# Patient Record
Sex: Male | Born: 1935 | Race: White | Hispanic: No | Marital: Married | Smoking: Former smoker
Health system: Southern US, Community
[De-identification: ages and names within clinical notes are randomized; demographics above are authoritative.]

## PROBLEM LIST (undated history)

## (undated) DIAGNOSIS — I119 Hypertensive heart disease without heart failure: Secondary | ICD-10-CM

## (undated) DIAGNOSIS — R2689 Other abnormalities of gait and mobility: Secondary | ICD-10-CM

## (undated) DIAGNOSIS — M25561 Pain in right knee: Secondary | ICD-10-CM

## (undated) DIAGNOSIS — I639 Cerebral infarction, unspecified: Secondary | ICD-10-CM

## (undated) DIAGNOSIS — C61 Malignant neoplasm of prostate: Secondary | ICD-10-CM

## (undated) DIAGNOSIS — E785 Hyperlipidemia, unspecified: Secondary | ICD-10-CM

## (undated) DIAGNOSIS — K859 Acute pancreatitis without necrosis or infection, unspecified: Secondary | ICD-10-CM

## (undated) DIAGNOSIS — M25562 Pain in left knee: Secondary | ICD-10-CM

## (undated) DIAGNOSIS — I482 Chronic atrial fibrillation, unspecified: Secondary | ICD-10-CM

## (undated) DIAGNOSIS — M1990 Unspecified osteoarthritis, unspecified site: Secondary | ICD-10-CM

## (undated) DIAGNOSIS — C801 Malignant (primary) neoplasm, unspecified: Secondary | ICD-10-CM

## (undated) DIAGNOSIS — E119 Type 2 diabetes mellitus without complications: Secondary | ICD-10-CM

## (undated) DIAGNOSIS — F0390 Unspecified dementia without behavioral disturbance: Secondary | ICD-10-CM

## (undated) HISTORY — DX: Unspecified osteoarthritis, unspecified site: M19.90

## (undated) HISTORY — PX: APPENDECTOMY: SHX54

## (undated) HISTORY — PX: PANCREAS SURGERY: SHX731

## (undated) HISTORY — PX: CHOLECYSTECTOMY: SHX55

## (undated) HISTORY — PX: EYE SURGERY: SHX253

---

## 2010-10-08 DIAGNOSIS — C801 Malignant (primary) neoplasm, unspecified: Secondary | ICD-10-CM

## 2010-10-08 HISTORY — DX: Malignant (primary) neoplasm, unspecified: C80.1

## 2014-10-27 ENCOUNTER — Emergency Department (HOSPITAL_COMMUNITY): Payer: Medicare HMO

## 2014-10-27 ENCOUNTER — Encounter (HOSPITAL_COMMUNITY): Payer: Self-pay | Admitting: Emergency Medicine

## 2014-10-27 ENCOUNTER — Emergency Department (HOSPITAL_COMMUNITY)
Admission: EM | Admit: 2014-10-27 | Discharge: 2014-10-27 | Disposition: A | Discharge status: Home / Self Care | Payer: Medicare HMO | Attending: Emergency Medicine | Admitting: Emergency Medicine

## 2014-10-27 DIAGNOSIS — F0390 Unspecified dementia without behavioral disturbance: Secondary | ICD-10-CM | POA: Insufficient documentation

## 2014-10-27 DIAGNOSIS — S0990XA Unspecified injury of head, initial encounter: Secondary | ICD-10-CM | POA: Diagnosis present

## 2014-10-27 DIAGNOSIS — Y998 Other external cause status: Secondary | ICD-10-CM | POA: Insufficient documentation

## 2014-10-27 DIAGNOSIS — W1839XA Other fall on same level, initial encounter: Secondary | ICD-10-CM | POA: Insufficient documentation

## 2014-10-27 DIAGNOSIS — Z7982 Long term (current) use of aspirin: Secondary | ICD-10-CM | POA: Insufficient documentation

## 2014-10-27 DIAGNOSIS — I10 Essential (primary) hypertension: Secondary | ICD-10-CM | POA: Insufficient documentation

## 2014-10-27 DIAGNOSIS — E785 Hyperlipidemia, unspecified: Secondary | ICD-10-CM | POA: Diagnosis not present

## 2014-10-27 DIAGNOSIS — Y9301 Activity, walking, marching and hiking: Secondary | ICD-10-CM | POA: Diagnosis not present

## 2014-10-27 DIAGNOSIS — Z8546 Personal history of malignant neoplasm of prostate: Secondary | ICD-10-CM | POA: Insufficient documentation

## 2014-10-27 DIAGNOSIS — Z79899 Other long term (current) drug therapy: Secondary | ICD-10-CM | POA: Insufficient documentation

## 2014-10-27 DIAGNOSIS — E119 Type 2 diabetes mellitus without complications: Secondary | ICD-10-CM | POA: Insufficient documentation

## 2014-10-27 DIAGNOSIS — R52 Pain, unspecified: Secondary | ICD-10-CM

## 2014-10-27 DIAGNOSIS — Z87891 Personal history of nicotine dependence: Secondary | ICD-10-CM | POA: Insufficient documentation

## 2014-10-27 DIAGNOSIS — S0083XA Contusion of other part of head, initial encounter: Secondary | ICD-10-CM | POA: Insufficient documentation

## 2014-10-27 DIAGNOSIS — W19XXXA Unspecified fall, initial encounter: Secondary | ICD-10-CM

## 2014-10-27 DIAGNOSIS — Y9289 Other specified places as the place of occurrence of the external cause: Secondary | ICD-10-CM | POA: Diagnosis not present

## 2014-10-27 DIAGNOSIS — S0081XA Abrasion of other part of head, initial encounter: Secondary | ICD-10-CM | POA: Insufficient documentation

## 2014-10-27 DIAGNOSIS — T1490XA Injury, unspecified, initial encounter: Secondary | ICD-10-CM

## 2014-10-27 DIAGNOSIS — S6992XA Unspecified injury of left wrist, hand and finger(s), initial encounter: Secondary | ICD-10-CM | POA: Diagnosis not present

## 2014-10-27 DIAGNOSIS — Z8719 Personal history of other diseases of the digestive system: Secondary | ICD-10-CM | POA: Insufficient documentation

## 2014-10-27 HISTORY — DX: Acute pancreatitis without necrosis or infection, unspecified: K85.90

## 2014-10-27 HISTORY — DX: Cerebral infarction, unspecified: I63.9

## 2014-10-27 HISTORY — DX: Malignant (primary) neoplasm, unspecified: C80.1

## 2014-10-27 HISTORY — DX: Type 2 diabetes mellitus without complications: E11.9

## 2014-10-27 HISTORY — DX: Hyperlipidemia, unspecified: E78.5

## 2014-10-27 HISTORY — DX: Malignant neoplasm of prostate: C61

## 2014-10-27 HISTORY — DX: Other abnormalities of gait and mobility: R26.89

## 2014-10-27 HISTORY — DX: Unspecified dementia, unspecified severity, without behavioral disturbance, psychotic disturbance, mood disturbance, and anxiety: F03.90

## 2014-10-27 MED ORDER — OXYCODONE-ACETAMINOPHEN 5-325 MG PO TABS: 1.0000 | ORAL_TABLET | ORAL | Status: DC | PRN

## 2014-10-27 MED ORDER — OXYCODONE-ACETAMINOPHEN 5-325 MG PO TABS
1.0000 | ORAL_TABLET | Freq: Once | ORAL | Status: AC
  Administered 2014-10-27: 1 via ORAL
  Filled 2014-10-27: qty 1

## 2014-10-27 NOTE — ED Provider Notes (Signed)
CSN: [id number]     Arrival date & time 10/27/14  7829 History   First MD Initiated Contact with Patient 10/27/14 [phone]     Chief Complaint  Patient presents with  . Fall  . Facial Injury      HPI Patient presents to the emergency department after a fall today's.  He states he lost his balance when he was walking down his driveway this morning.  He injured his face and his chin.  He reports head injury.  He denies significant neck pain.  He reports pain in his left chest and pain in his right wrist.  Patient is on anticoagulation and was concerned about a large hematoma of his chin.  Family reports no altered mental status.  Patient denies nausea or vomiting.  No loss consciousness.  Mild left-sided headache at this time.  Mild right jaw pain.  No significant shortness of breath.  Denies abdominal pain.  Denies neck pain.  No weakness of his arms or legs.  Small laceration to his buccal mucosa surface of his lower lip.   Past Medical History  Diagnosis Date  . Pancreatitis   . A-fib   . Cancer   . Prostate cancer   . Dementia   . Stroke   . Hypertension   . Diabetes mellitus without complication   . Hyperlipidemia   . Poor balance    Past Surgical History  Procedure Laterality Date  . Eye surgery    . Appendectomy    . Cholecystectomy    . Pancreas surgery     History reviewed. No pertinent family history. History  Substance Use Topics  . Smoking status: Former Research scientist (life sciences)  . Smokeless tobacco: Never Used  . Alcohol Use: Yes     Comment: "a beer once in a while"    Review of Systems  All other systems reviewed and are negative.     Allergies  Review of patient's allergies indicates no known allergies.  Home Medications   Prior to Admission medications   Medication Sig Start Date End Date Taking? Authorizing Provider  aspirin 325 MG tablet Take 325 mg by mouth daily.   Yes Historical Provider, MD  bicalutamide (CASODEX) 50 MG tablet Take 50 mg by mouth at bedtime.    Yes Historical Provider, MD  bimatoprost (LUMIGAN) 0.01 % SOLN Place 1 drop into both eyes at bedtime.   Yes Historical Provider, MD  brimonidine (ALPHAGAN P) 0.1 % SOLN Place 1 drop into both eyes every morning.   Yes Historical Provider, MD  dabigatran (PRADAXA) 150 MG CAPS capsule Take 150 mg by mouth 2 (two) times daily.   Yes Historical Provider, MD  lovastatin (MEVACOR) 40 MG tablet Take 40 mg by mouth at bedtime.   Yes Historical Provider, MD  metFORMIN (GLUCOPHAGE) 500 MG tablet Take 500 mg by mouth daily.   Yes Historical Provider, MD  valsartan-hydrochlorothiazide (DIOVAN-HCT) 80-12.5 MG per tablet Take 1 tablet by mouth daily.   Yes Historical Provider, MD   BP 151/67 mmHg  Pulse 59  Temp(Src) 97.8 F (36.6 C) (Oral)  Resp 17  Ht 6' (1.829 m)  Wt 190 lb (86.183 kg)  BMI 25.76 kg/m2  SpO2 96% Physical Exam  Constitutional: He is oriented to person, place, and time. He appears well-developed and well-nourished.  HENT:  Multiple abrasions of the left side of his face.  Mild swelling of his lower lip.  Small laceration to the buccal mucosa without active bleeding.  Large hematoma to his  midline chin.  No trismus or malocclusion.  No new dental injury or subluxation.   Eyes: EOM are normal.  Neck: Normal range of motion. Neck supple.  C-spine nontender.  C-spine cleared by Nexus criteria.  Cardiovascular: Normal rate, regular rhythm, normal heart sounds and intact distal pulses.   Pulmonary/Chest: Effort normal and breath sounds normal. No respiratory distress.  Abdominal: Soft. He exhibits no distension. There is no tenderness.  Musculoskeletal:  Mild tenderness of the left wrist in mild pain with range of motion.  No point tenderness at his anatomic snuffbox on the left.  Full range of motion of bilateral hips, knees, ankles.  Full range of motion of bilateral elbows and shoulders.  No thoracic tenderness or lumbar tenderness.  Neurological: He is alert and oriented to person,  place, and time.  Skin: Skin is warm and dry.  Psychiatric: He has a normal mood and affect. Judgment normal.  Nursing note and vitals reviewed.   ED Course  Procedures (including critical care time) Labs Review Labs Reviewed - No data to display  Imaging Review Dg Chest 2 View  10/27/2014   CLINICAL DATA:  81 year old male who fell when dizzy this morning. Trauma with mid anterior chest pain. Initial encounter.  EXAM: CHEST  2 VIEW  COMPARISON:  Head and face CT from the same day reported separately.  FINDINGS: Seated upright AP and lateral views of the chest. Mild cardiomegaly. Other mediastinal contours are within normal limits. Visualized tracheal air column is within normal limits. Somewhat low lung volumes. No pneumothorax or pleural effusion. No consolidation, pulmonary edema, or confluent pulmonary opacity. Negative visible bowel gas pattern. Calcified atherosclerosis of the aorta. No acute osseous abnormality identified.  IMPRESSION: Low lung volumes. No acute cardiopulmonary abnormality or acute traumatic injury identified.   Electronically Signed   By: Lars Pinks M.D.   On: 10/27/2014 10:56   Dg Wrist Complete Left  10/27/2014   CLINICAL DATA:  81 year old male status post fall onto left wrist with pain, specially at the base of the left thumb. Initial encounter.  EXAM: LEFT WRIST - COMPLETE 3+ VIEW  COMPARISON:  None.  FINDINGS: Calcified atherosclerosis about the left wrist. Chondrocalcinosis along the medial carpal bones, which can be seen in the setting of calcium pyrophosphate deposition disease. Distal left radius and ulna appear intact. Mild widening of the scapholunate interval. No scaphoid fracture. No carpal bone fracture identified. Left thumb metacarpal appears intact. Other visible metacarpals appear intact.  IMPRESSION: 1. No acute fracture or dislocation identified about the left wrist. 2. Mild widening of the scapholunate interval suggesting age indeterminate ligamentous  injury. 3. Chondrocalcinosis.  Calcified atherosclerosis.   Electronically Signed   By: Lars Pinks M.D.   On: 10/27/2014 08:16   Ct Head Wo Contrast  10/27/2014   CLINICAL DATA:  81 year old male who fell on the driveway this morning. Abrasions, pain, unknown loss of consciousness. Initial encounter.  EXAM: CT HEAD WITHOUT CONTRAST  CT MAXILLOFACIAL WITHOUT CONTRAST  TECHNIQUE: Multidetector CT imaging of the head and maxillofacial structures were performed using the standard protocol without intravenous contrast. Multiplanar CT image reconstructions of the maxillofacial structures were also generated.  COMPARISON:  None.  FINDINGS: CT HEAD FINDINGS  Visualized orbit soft tissues are within normal limits. No scalp hematoma identified. Visualized paranasal sinuses and mastoids are clear. Calvarium intact.  No midline shift, mass effect, or evidence of intracranial mass lesion. No acute intracranial hemorrhage identified. Calcified atherosclerosis at the skull base. Intracranial artery dolichoectasia.  No suspicious intracranial vascular hyperdensity. No evidence of cortically based acute infarction identified. Patchy and confluent cerebral white matter hypodensity. Perivascular space or less likely chronic lacunar infarct at the genu of the left internal capsule.  CT MAXILLOFACIAL FINDINGS  Visualized orbit soft tissues are within normal limits. Negative non contrast deep soft tissue spaces of the face. Bulky carotid calcified plaque. Retropharyngeal course of the carotid, more so the right.  Soft tissue swelling and stranding about the left mandible. Associated left submental hematoma, 16 mm in thickness by 26 mm diameter. Other superficial face soft tissues are within normal limits.  Much of the patient's dentition is absent. There is a fracture through the anterior left mandible bicuspid (series 12, image 46). No associated mandible fracture.  Scattered mild paranasal sinus mucosal thickening and small mucous  retention cysts. No maxilla or zygoma fracture. No nasal bone fracture identified. Orbital walls appear intact. Visible cervical spine appears intact with disc and facet degeneration.  IMPRESSION: 1. Left chin hematoma. Underlying fracture through the roots of the left mandible bicuspid tooth, but no mandible or other facial fracture identified. 2. No other acute traumatic injury identified. 3. No acute intracranial abnormality. Nonspecific hypodensity in the brain, most commonly due to chronic small vessel disease.   Electronically Signed   By: Lars Pinks M.D.   On: 10/27/2014 08:59   Ct Maxillofacial Wo Cm  10/27/2014   CLINICAL DATA:  81 year old male who fell on the driveway this morning. Abrasions, pain, unknown loss of consciousness. Initial encounter.  EXAM: CT HEAD WITHOUT CONTRAST  CT MAXILLOFACIAL WITHOUT CONTRAST  TECHNIQUE: Multidetector CT imaging of the head and maxillofacial structures were performed using the standard protocol without intravenous contrast. Multiplanar CT image reconstructions of the maxillofacial structures were also generated.  COMPARISON:  None.  FINDINGS: CT HEAD FINDINGS  Visualized orbit soft tissues are within normal limits. No scalp hematoma identified. Visualized paranasal sinuses and mastoids are clear. Calvarium intact.  No midline shift, mass effect, or evidence of intracranial mass lesion. No acute intracranial hemorrhage identified. Calcified atherosclerosis at the skull base. Intracranial artery dolichoectasia. No suspicious intracranial vascular hyperdensity. No evidence of cortically based acute infarction identified. Patchy and confluent cerebral white matter hypodensity. Perivascular space or less likely chronic lacunar infarct at the genu of the left internal capsule.  CT MAXILLOFACIAL FINDINGS  Visualized orbit soft tissues are within normal limits. Negative non contrast deep soft tissue spaces of the face. Bulky carotid calcified plaque. Retropharyngeal course  of the carotid, more so the right.  Soft tissue swelling and stranding about the left mandible. Associated left submental hematoma, 16 mm in thickness by 26 mm diameter. Other superficial face soft tissues are within normal limits.  Much of the patient's dentition is absent. There is a fracture through the anterior left mandible bicuspid (series 12, image 46). No associated mandible fracture.  Scattered mild paranasal sinus mucosal thickening and small mucous retention cysts. No maxilla or zygoma fracture. No nasal bone fracture identified. Orbital walls appear intact. Visible cervical spine appears intact with disc and facet degeneration.  IMPRESSION: 1. Left chin hematoma. Underlying fracture through the roots of the left mandible bicuspid tooth, but no mandible or other facial fracture identified. 2. No other acute traumatic injury identified. 3. No acute intracranial abnormality. Nonspecific hypodensity in the brain, most commonly due to chronic small vessel disease.   Electronically Signed   By: Lars Pinks M.D.   On: 10/27/2014 08:59  I personally reviewed the imaging tests  through PACS system I reviewed available ER/hospitalization records through the EMR    EKG Interpretation None      MDM   Final diagnoses:  Fall  Head injury  Pain  Trauma    Patient is overall well-appearing.  No indication for repair of the laceration of the buccal surface of his lower lip.  Large hematoma.  A vas that the patient hold his anticoagulation for the next 2 doses and then he will resume.  Ice to his hematoma.  No obvious mandible fracture.  No obvious wrist fracture.  Primary care follow-up.  Infection warnings given.  Head injury warnings given.  Recommended antibiotic ointment for his abrasions.  Initially when the patient tried to get out to be discharged she felt slightly weak and dizzy.  After eating and drinking he felt much better was discharged safely from the emergency department.  Vital signs are  normal.    Hoy Morn, MD 10/27/14 [phone]

## 2014-10-27 NOTE — ED Notes (Addendum)
Pt from home via [facility] with c/o falling in the driveway while reaching for the morning paper.  Pt has multiple abrasions to his face, painful left wrist, left rib pain, and bilateral knee abrasions.  Denies LOC, but states "I really don't know.  It happened so fast."  Pt in NAD, A&O.

## 2014-10-27 NOTE — ED Notes (Signed)
Unable to ambulate pt, pt c/o of dizziness with sitting and standing.

## 2014-12-28 ENCOUNTER — Other Ambulatory Visit: Payer: Self-pay | Admitting: Urology

## 2014-12-28 DIAGNOSIS — E291 Testicular hypofunction: Secondary | ICD-10-CM

## 2014-12-28 DIAGNOSIS — C61 Malignant neoplasm of prostate: Secondary | ICD-10-CM

## 2015-01-12 ENCOUNTER — Encounter (HOSPITAL_COMMUNITY): Payer: Self-pay

## 2015-01-12 ENCOUNTER — Encounter (HOSPITAL_COMMUNITY)
Admission: RE | Admit: 2015-01-12 | Discharge: 2015-01-12 | Disposition: A | Discharge status: Home / Self Care | Payer: Medicare PPO | Source: Ambulatory Visit | Attending: Urology | Admitting: Urology

## 2015-01-12 ENCOUNTER — Ambulatory Visit
Admission: RE | Admit: 2015-01-12 | Discharge: 2015-01-12 | Disposition: A | Discharge status: Home / Self Care | Payer: Medicare PPO | Source: Ambulatory Visit | Attending: Urology | Admitting: Urology

## 2015-01-12 DIAGNOSIS — C61 Malignant neoplasm of prostate: Secondary | ICD-10-CM | POA: Insufficient documentation

## 2015-01-12 DIAGNOSIS — E291 Testicular hypofunction: Secondary | ICD-10-CM

## 2015-01-12 MED ORDER — TECHNETIUM TC 99M MEDRONATE IV KIT
26.3000 | PACK | Freq: Once | INTRAVENOUS | Status: AC | PRN
  Administered 2015-01-12: 26.3 via INTRAVENOUS

## 2015-01-13 ENCOUNTER — Encounter (HOSPITAL_COMMUNITY): Payer: Medicare HMO

## 2015-01-13 ENCOUNTER — Other Ambulatory Visit: Payer: Medicare HMO

## 2015-02-14 ENCOUNTER — Encounter (HOSPITAL_COMMUNITY): Payer: Self-pay | Admitting: *Deleted

## 2015-02-14 ENCOUNTER — Emergency Department (HOSPITAL_COMMUNITY): Payer: Medicare PPO

## 2015-02-14 ENCOUNTER — Inpatient Hospital Stay (HOSPITAL_COMMUNITY): Payer: Medicare PPO

## 2015-02-14 ENCOUNTER — Inpatient Hospital Stay (HOSPITAL_COMMUNITY)
Admission: EM | Admit: 2015-02-14 | Discharge: 2015-02-18 | DRG: 200 | Disposition: A | Discharge status: Skilled Nursing Facility | Payer: Medicare PPO | Attending: General Surgery | Admitting: General Surgery

## 2015-02-14 DIAGNOSIS — I4891 Unspecified atrial fibrillation: Secondary | ICD-10-CM | POA: Diagnosis present

## 2015-02-14 DIAGNOSIS — Z87891 Personal history of nicotine dependence: Secondary | ICD-10-CM | POA: Diagnosis not present

## 2015-02-14 DIAGNOSIS — Z9181 History of falling: Secondary | ICD-10-CM

## 2015-02-14 DIAGNOSIS — E119 Type 2 diabetes mellitus without complications: Secondary | ICD-10-CM | POA: Diagnosis present

## 2015-02-14 DIAGNOSIS — Z8673 Personal history of transient ischemic attack (TIA), and cerebral infarction without residual deficits: Secondary | ICD-10-CM | POA: Diagnosis not present

## 2015-02-14 DIAGNOSIS — F0390 Unspecified dementia without behavioral disturbance: Secondary | ICD-10-CM | POA: Diagnosis present

## 2015-02-14 DIAGNOSIS — Z79899 Other long term (current) drug therapy: Secondary | ICD-10-CM | POA: Diagnosis not present

## 2015-02-14 DIAGNOSIS — Z7902 Long term (current) use of antithrombotics/antiplatelets: Secondary | ICD-10-CM | POA: Diagnosis not present

## 2015-02-14 DIAGNOSIS — C61 Malignant neoplasm of prostate: Secondary | ICD-10-CM | POA: Diagnosis present

## 2015-02-14 DIAGNOSIS — J942 Hemothorax: Secondary | ICD-10-CM | POA: Diagnosis present

## 2015-02-14 DIAGNOSIS — S270XXA Traumatic pneumothorax, initial encounter: Principal | ICD-10-CM | POA: Diagnosis present

## 2015-02-14 DIAGNOSIS — J939 Pneumothorax, unspecified: Secondary | ICD-10-CM

## 2015-02-14 DIAGNOSIS — F0391 Unspecified dementia with behavioral disturbance: Secondary | ICD-10-CM | POA: Diagnosis present

## 2015-02-14 DIAGNOSIS — I10 Essential (primary) hypertension: Secondary | ICD-10-CM | POA: Diagnosis present

## 2015-02-14 DIAGNOSIS — S2242XA Multiple fractures of ribs, left side, initial encounter for closed fracture: Secondary | ICD-10-CM

## 2015-02-14 DIAGNOSIS — Z79891 Long term (current) use of opiate analgesic: Secondary | ICD-10-CM

## 2015-02-14 DIAGNOSIS — R451 Restlessness and agitation: Secondary | ICD-10-CM

## 2015-02-14 DIAGNOSIS — W19XXXA Unspecified fall, initial encounter: Secondary | ICD-10-CM | POA: Diagnosis present

## 2015-02-14 DIAGNOSIS — R410 Disorientation, unspecified: Secondary | ICD-10-CM

## 2015-02-14 DIAGNOSIS — E785 Hyperlipidemia, unspecified: Secondary | ICD-10-CM | POA: Diagnosis present

## 2015-02-14 DIAGNOSIS — F03918 Unspecified dementia, unspecified severity, with other behavioral disturbance: Secondary | ICD-10-CM | POA: Diagnosis present

## 2015-02-14 DIAGNOSIS — S272XXA Traumatic hemopneumothorax, initial encounter: Secondary | ICD-10-CM

## 2015-02-14 HISTORY — DX: Pain in right knee: M25.562

## 2015-02-14 HISTORY — DX: Pain in right knee: M25.561

## 2015-02-14 LAB — I-STAT CHEM 8, ED
BUN: 39 mg/dL — AB (ref 6–20)
CALCIUM ION: 1.13 mmol/L (ref 1.13–1.30)
CHLORIDE: 98 mmol/L — AB (ref 101–111)
Creatinine, Ser: 1.3 mg/dL — ABNORMAL HIGH (ref 0.61–1.24)
GLUCOSE: 133 mg/dL — AB (ref 70–99)
HCT: 41 % (ref 39.0–52.0)
Hemoglobin: 13.9 g/dL (ref 13.0–17.0)
Potassium: 3.7 mmol/L (ref 3.5–5.1)
Sodium: 139 mmol/L (ref 135–145)
TCO2: 27 mmol/L (ref 0–100)

## 2015-02-14 LAB — LIPASE, BLOOD: Lipase: 12 U/L — ABNORMAL LOW (ref 22–51)

## 2015-02-14 LAB — CBC WITH DIFFERENTIAL/PLATELET
BASOS PCT: 0 % (ref 0–1)
Basophils Absolute: 0 10*3/uL (ref 0.0–0.1)
Eosinophils Absolute: 0.3 10*3/uL (ref 0.0–0.7)
Eosinophils Relative: 5 % (ref 0–5)
HEMATOCRIT: 41.1 % (ref 39.0–52.0)
HEMOGLOBIN: 13.6 g/dL (ref 13.0–17.0)
Lymphocytes Relative: 30 % (ref 12–46)
Lymphs Abs: 1.8 10*3/uL (ref 0.7–4.0)
MCH: 32.2 pg (ref 26.0–34.0)
MCHC: 33.1 g/dL (ref 30.0–36.0)
MCV: 97.2 fL (ref 78.0–100.0)
MONOS PCT: 8 % (ref 3–12)
Monocytes Absolute: 0.5 10*3/uL (ref 0.1–1.0)
NEUTROS ABS: 3.5 10*3/uL (ref 1.7–7.7)
NEUTROS PCT: 57 % (ref 43–77)
Platelets: 191 10*3/uL (ref 150–400)
RBC: 4.23 MIL/uL (ref 4.22–5.81)
RDW: 12.6 % (ref 11.5–15.5)
WBC: 6.2 10*3/uL (ref 4.0–10.5)

## 2015-02-14 LAB — COMPREHENSIVE METABOLIC PANEL
ALT: 19 U/L (ref 17–63)
AST: 23 U/L (ref 15–41)
Albumin: 4.3 g/dL (ref 3.5–5.0)
Alkaline Phosphatase: 60 U/L (ref 38–126)
Anion gap: 7 (ref 5–15)
BILIRUBIN TOTAL: 2.6 mg/dL — AB (ref 0.3–1.2)
BUN: 41 mg/dL — ABNORMAL HIGH (ref 6–20)
CO2: 30 mmol/L (ref 22–32)
Calcium: 9.5 mg/dL (ref 8.9–10.3)
Chloride: 103 mmol/L (ref 101–111)
Creatinine, Ser: 1.22 mg/dL (ref 0.61–1.24)
GFR calc Af Amer: >60 mL/min (ref >60)
GFR, EST NON AFRICAN AMERICAN: 52 mL/min — AB (ref >60)
Glucose, Bld: 136 mg/dL — ABNORMAL HIGH (ref 70–99)
POTASSIUM: 3.7 mmol/L (ref 3.5–5.1)
SODIUM: 140 mmol/L (ref 135–145)
Total Protein: 6.9 g/dL (ref 6.5–8.1)

## 2015-02-14 LAB — TROPONIN I: Troponin I: <0.03 ng/mL (ref <0.031)

## 2015-02-14 LAB — MRSA PCR SCREENING: MRSA BY PCR: NEGATIVE

## 2015-02-14 LAB — GLUCOSE, CAPILLARY: Glucose-Capillary: 106 mg/dL — ABNORMAL HIGH (ref 70–99)

## 2015-02-14 MED ORDER — DOCUSATE SODIUM 100 MG PO CAPS
200.0000 mg | ORAL_CAPSULE | Freq: Every day | ORAL | Status: DC
  Administered 2015-02-15 – 2015-02-18 (×4): 200 mg via ORAL
  Filled 2015-02-14 (×4): qty 2

## 2015-02-14 MED ORDER — FENTANYL CITRATE (PF) 100 MCG/2ML IJ SOLN
INTRAMUSCULAR | Status: AC
  Administered 2015-02-14: 50 ug
  Filled 2015-02-14: qty 4

## 2015-02-14 MED ORDER — LIDOCAINE-EPINEPHRINE 1 %-1:100000 IJ SOLN
30.0000 mL | Freq: Once | INTRAMUSCULAR | Status: DC
  Filled 2015-02-14: qty 30

## 2015-02-14 MED ORDER — POTASSIUM CHLORIDE IN NACL 20-0.9 MEQ/L-% IV SOLN
INTRAVENOUS | Status: DC
  Administered 2015-02-14 – 2015-02-18 (×4): via INTRAVENOUS
  Filled 2015-02-14 (×10): qty 1000

## 2015-02-14 MED ORDER — TRAMADOL HCL 50 MG PO TABS: 50.0000 mg | ORAL_TABLET | Freq: Four times a day (QID) | ORAL | Status: DC | PRN

## 2015-02-14 MED ORDER — IOHEXOL 300 MG/ML  SOLN
100.0000 mL | Freq: Once | INTRAMUSCULAR | Status: AC | PRN
  Administered 2015-02-14: 100 mL via INTRAVENOUS

## 2015-02-14 MED ORDER — PANTOPRAZOLE SODIUM 40 MG PO TBEC
40.0000 mg | DELAYED_RELEASE_TABLET | Freq: Every day | ORAL | Status: DC
  Administered 2015-02-15 – 2015-02-18 (×4): 40 mg via ORAL
  Filled 2015-02-14 (×4): qty 1

## 2015-02-14 MED ORDER — HYDROMORPHONE HCL 1 MG/ML IJ SOLN: 0.5000 mg | INTRAMUSCULAR | Status: DC | PRN

## 2015-02-14 MED ORDER — ACETAMINOPHEN 325 MG PO TABS
650.0000 mg | ORAL_TABLET | Freq: Four times a day (QID) | ORAL | Status: DC | PRN
  Administered 2015-02-15: 650 mg via ORAL
  Filled 2015-02-14 (×3): qty 2

## 2015-02-14 MED ORDER — OXYCODONE HCL 5 MG PO TABS
5.0000 mg | ORAL_TABLET | ORAL | Status: DC | PRN
Start: 2015-02-14 — End: 2015-02-14

## 2015-02-14 MED ORDER — HYDROMORPHONE HCL 1 MG/ML IJ SOLN
0.5000 mg | INTRAMUSCULAR | Status: DC | PRN
  Administered 2015-02-14: 1 mg via INTRAVENOUS
  Filled 2015-02-14: qty 1

## 2015-02-14 MED ORDER — LIDOCAINE HCL (PF) 1 % IJ SOLN
INTRAMUSCULAR | Status: AC
  Administered 2015-02-14: 5 mL
  Filled 2015-02-14: qty 10

## 2015-02-14 MED ORDER — MIDAZOLAM HCL 2 MG/2ML IJ SOLN
INTRAMUSCULAR | Status: AC
  Administered 2015-02-14: 2 mg
  Filled 2015-02-14: qty 4

## 2015-02-14 MED ORDER — METFORMIN HCL 500 MG PO TABS
500.0000 mg | ORAL_TABLET | Freq: Every day | ORAL | Status: DC
  Administered 2015-02-15 – 2015-02-18 (×4): 500 mg via ORAL
  Filled 2015-02-14 (×5): qty 1

## 2015-02-14 MED ORDER — PRAVASTATIN SODIUM 10 MG PO TABS
40.0000 mg | ORAL_TABLET | Freq: Every day | ORAL | Status: DC
  Administered 2015-02-15 – 2015-02-17 (×2): 40 mg via ORAL
  Filled 2015-02-14: qty 4
  Filled 2015-02-14 (×4): qty 1

## 2015-02-14 MED ORDER — MORPHINE SULFATE 2 MG/ML IJ SOLN
1.0000 mg | INTRAMUSCULAR | Status: AC | PRN
  Administered 2015-02-14 (×2): 1 mg via INTRAVENOUS
  Filled 2015-02-14 (×2): qty 1

## 2015-02-14 MED ORDER — LATANOPROST 0.005 % OP SOLN
1.0000 [drp] | Freq: Every day | OPHTHALMIC | Status: DC
Start: 2015-02-14 — End: 2015-02-18
  Administered 2015-02-14 – 2015-02-18 (×4): 1 [drp] via OPHTHALMIC
  Filled 2015-02-14 (×2): qty 2.5

## 2015-02-14 MED ORDER — BRIMONIDINE TARTRATE 0.15 % OP SOLN
1.0000 [drp] | Freq: Two times a day (BID) | OPHTHALMIC | Status: DC
Start: 2015-02-14 — End: 2015-02-18
  Administered 2015-02-14 – 2015-02-18 (×8): 1 [drp] via OPHTHALMIC
  Filled 2015-02-14 (×2): qty 5

## 2015-02-14 MED ORDER — ACETAMINOPHEN 650 MG RE SUPP: 650.0000 mg | Freq: Four times a day (QID) | RECTAL | Status: DC | PRN

## 2015-02-14 MED ORDER — PANTOPRAZOLE SODIUM 40 MG PO TBEC: 40.0000 mg | DELAYED_RELEASE_TABLET | Freq: Every day | ORAL | Status: DC

## 2015-02-14 NOTE — ED Provider Notes (Signed)
CSN: [id number]     Arrival date & time 02/14/15  0747 History   First MD Initiated Contact with Patient 02/14/15 [phone]     Chief Complaint  Patient presents with  . Fall  . Abdominal Pain  . Fatigue  . Chest Pain      Patient is a 81 y.o. male presenting with fall and abdominal pain. The history is provided by a relative, the spouse and the patient. The history is limited by the condition of the patient (Hx dementia).  Fall Associated symptoms include abdominal pain.  Abdominal Pain Pt was seen at 0755. Per pt and his family: c/o gradual onset and worsening of persistent generalized weakness for the past 2 weeks. Pt fell 2 weeks ago due to "weakness." Pt c/o left sided ribs "pain" after the fall.  Pt was evaluated by his PMD 6 days ago for his symptoms, and was dx with "possible rib fractures" and rx tramadol. Pt has been taking the tramadol without relief of his pain. Pt points to his left ribs and LUQ abd and states "this is where it hurts." Pt's family states pt "can't even get up out of a chair because he's so weak" over the past few days. Pt's family states they were told 6 days ago by his PMD he "needed some physical therapy" for his weakness, but it has not been arranged yet. Pt states his "legs just get weak and can't hold me up." Denies CP/SOB, no cough, no neck or back pain, no N/V/D, no focal motor weakness, no tingling/numbness in extremities, no syncope, no headache. Pt has significant hx of dementia.    Past Medical History  Diagnosis Date  . Pancreatitis   . A-fib   . Cancer   . Prostate cancer   . Dementia   . Stroke   . Hypertension   . Diabetes mellitus without complication   . Hyperlipidemia   . Poor balance   . Bilateral knee pain    Past Surgical History  Procedure Laterality Date  . Eye surgery    . Appendectomy    . Cholecystectomy    . Pancreas surgery      History  Substance Use Topics  . Smoking status: Former Research scientist (life sciences)  . Smokeless tobacco: Never  Used  . Alcohol Use: Yes     Comment: "a beer once in a while"    Review of Systems  Unable to perform ROS: Dementia  Gastrointestinal: Positive for abdominal pain.      Allergies  Review of patient's allergies indicates no known allergies.  Home Medications   Prior to Admission medications   Medication Sig Start Date End Date Taking? Authorizing Provider  bimatoprost (LUMIGAN) 0.01 % SOLN Place 1 drop into both eyes at bedtime.   Yes Historical Provider, MD  brimonidine (ALPHAGAN P) 0.1 % SOLN Place 1 drop into both eyes 2 (two) times daily.    Yes Historical Provider, MD  dabigatran (PRADAXA) 150 MG CAPS capsule Take 150 mg by mouth 2 (two) times daily.   Yes Historical Provider, MD  docusate sodium (COLACE) 100 MG capsule Take 200 mg by mouth daily.   Yes Historical Provider, MD  lovastatin (MEVACOR) 40 MG tablet Take 40 mg by mouth at bedtime.   Yes Historical Provider, MD  metFORMIN (GLUCOPHAGE) 500 MG tablet Take 500 mg by mouth daily.   Yes Historical Provider, MD  omeprazole (PRILOSEC) 20 MG capsule Take 20 mg by mouth daily as needed (heartburn, acid reflux).  Yes Historical Provider, MD  traMADol (ULTRAM) 50 MG tablet Take 1 tablet by mouth every 6 (six) hours as needed. pain 02/09/15  Yes Historical Provider, MD  valsartan-hydrochlorothiazide (DIOVAN-HCT) 80-12.5 MG per tablet Take 1 tablet by mouth daily.   Yes Historical Provider, MD  vismodegib (ERIVEDGE) 150 MG capsule Take 150 mg by mouth daily.   Yes Historical Provider, MD  oxyCODONE-acetaminophen (PERCOCET/ROXICET) 5-325 MG per tablet Take 1 tablet by mouth every 4 (four) hours as needed for severe pain. Patient not taking: Reported on 02/14/2015 10/27/14   Jola Schmidt, MD   BP 177/85 mmHg  Pulse 66  Temp(Src) 98 F (36.7 C) (Oral)  Resp 16  SpO2 96%    08:35 Orthostatic Vital Signs AC  Orthostatic Lying  - BP- Lying: 132/76 mmHg ; Pulse- Lying: 56  Orthostatic Sitting - BP- Sitting: 133/66 mmHg ; Pulse-  Sitting: 66  Orthostatic Standing at 0 minutes - BP- Standing at 0 minutes: 125/80 mmHg ; Pulse- Standing at 0 minutes: 71      Physical Exam  0800: Physical examination:  Nursing notes reviewed; Vital signs and O2 SAT reviewed;  Constitutional: Well developed, Well nourished, Well hydrated, In no acute distress; Head:  Normocephalic, atraumatic; Eyes: EOMI, PERRL, No scleral icterus; ENMT: Mouth and pharynx normal, Mucous membranes moist; Neck: Supple, Full range of motion, No lymphadenopathy; Cardiovascular: Irregular irregular rate and rhythm, No gallop; Respiratory: Breath sounds clear & equal bilaterally, No wheezes.  Speaking full sentences with ease, Normal respiratory effort/excursion; Chest: +left lower lateral ribs tender to palp. No deformity. No abrasions or ecchymosis. Movement normal; Abdomen: Soft, +LUQ tenderness to palp. No abrasions. +small round yellowing/fading ecchymosis to left upper abd wall. No rebound or guarding. Nondistended, Normal bowel sounds; Genitourinary: No CVA tenderness; Spine:  No midline CS, TS, LS tenderness.;; Extremities: Pulses normal, No tenderness, No edema, No calf edema or asymmetry.; Neuro: Awake, alert, confused re: events. Major CN grossly intact. No facial droop. Speech clear. Moves all extremities spontaneously and to command without apparent gross focal motor deficits.; Skin: Color normal, Warm, Dry.   ED Course  Procedures     EKG Interpretation   Date/Time:  Monday Feb 14 2015 08:18:32 EDT Ventricular Rate:  59 PR Interval:    QRS Duration: 98 QT Interval:  450 QTC Calculation: 446 R Axis:   53 Text Interpretation:  Atrial fibrillation Borderline low voltage,  extremity leads Anteroseptal infarct, old Nonspecific ST and T wave  abnormality Baseline wander Artifact No old tracing to compare Confirmed  by [facility]  MD, Nunzio Cory [phone]) on 02/14/2015 8:57:11 AM      MDM  MDM Reviewed: previous chart, nursing note and vitals Reviewed  previous: labs, x-ray and CT scan Interpretation: labs, ECG, x-ray and CT scan Total time providing critical care: 30-74 minutes. This excludes time spent performing separately reportable procedures and services. Consults: general surgery     CRITICAL CARE Performed by: Alfonzo Feller Total critical care time: 45 Critical care time was exclusive of separately billable procedures and treating other patients. Critical care was necessary to treat or prevent imminent or life-threatening deterioration. Critical care was time spent personally by me on the following activities: development of treatment plan with patient and/or surrogate as well as nursing, discussions with consultants, evaluation of patient's response to treatment, examination of patient, obtaining history from patient or surrogate, ordering and performing treatments and interventions, ordering and review of laboratory studies, ordering and review of radiographic studies, pulse oximetry and re-evaluation of patient's  condition.   Results for orders placed or performed during the hospital encounter of 02/14/15  Comprehensive metabolic panel  Result Value Ref Range   Sodium 140 135 - 145 mmol/L   Potassium 3.7 3.5 - 5.1 mmol/L   Chloride 103 101 - 111 mmol/L   CO2 30 22 - 32 mmol/L   Glucose, Bld 136 (H) 70 - 99 mg/dL   BUN 41 (H) 6 - 20 mg/dL   Creatinine, Ser 1.22 0.61 - 1.24 mg/dL   Calcium 9.5 8.9 - 10.3 mg/dL   Total Protein 6.9 6.5 - 8.1 g/dL   Albumin 4.3 3.5 - 5.0 g/dL   AST 23 15 - 41 U/L   ALT 19 17 - 63 U/L   Alkaline Phosphatase 60 38 - 126 U/L   Total Bilirubin 2.6 (H) 0.3 - 1.2 mg/dL   GFR calc non Af Amer 52 (L) >60 mL/min   GFR calc Af Amer >60 >60 mL/min   Anion gap 7 5 - 15  Lipase, blood  Result Value Ref Range   Lipase 12 (L) 22 - 51 U/L  Troponin I  Result Value Ref Range   Troponin I <0.03 <0.031 ng/mL  CBC with Differential  Result Value Ref Range   WBC 6.2 4.0 - 10.5 K/uL   RBC 4.23 4.22  - 5.81 MIL/uL   Hemoglobin 13.6 13.0 - 17.0 g/dL   HCT 41.1 39.0 - 52.0 %   MCV 97.2 78.0 - 100.0 fL   MCH 32.2 26.0 - 34.0 pg   MCHC 33.1 30.0 - 36.0 g/dL   RDW 12.6 11.5 - 15.5 %   Platelets 191 150 - 400 K/uL   Neutrophils Relative % 57 43 - 77 %   Neutro Abs 3.5 1.7 - 7.7 K/uL   Lymphocytes Relative 30 12 - 46 %   Lymphs Abs 1.8 0.7 - 4.0 K/uL   Monocytes Relative 8 3 - 12 %   Monocytes Absolute 0.5 0.1 - 1.0 K/uL   Eosinophils Relative 5 0 - 5 %   Eosinophils Absolute 0.3 0.0 - 0.7 K/uL   Basophils Relative 0 0 - 1 %   Basophils Absolute 0.0 0.0 - 0.1 K/uL  I-stat Chem 8, ED  Result Value Ref Range   Sodium 139 135 - 145 mmol/L   Potassium 3.7 3.5 - 5.1 mmol/L   Chloride 98 (L) 101 - 111 mmol/L   BUN 39 (H) 6 - 20 mg/dL   Creatinine, Ser 1.30 (H) 0.61 - 1.24 mg/dL   Glucose, Bld 133 (H) 70 - 99 mg/dL   Calcium, Ion 1.13 1.13 - 1.30 mmol/L   TCO2 27 0 - 100 mmol/L   Hemoglobin 13.9 13.0 - 17.0 g/dL   HCT 41.0 39.0 - 52.0 %   Dg Ribs Unilateral W/chest Left 02/14/2015   CLINICAL DATA:  Multiple recent falls, left side pain  EXAM: LEFT RIBS AND CHEST - 3+ VIEW  COMPARISON:  1/ 20/16  FINDINGS: Four views left ribs submitted. No pulmonary edema. There is mild left basilar atelectasis or lung contusion. There is left pneumothorax about 35%. Nondisplaced rib fracture of the left 7, eighth and ninth rib.  IMPRESSION: No pulmonary edema. There is mild left basilar atelectasis or lung contusion. There is left pneumothorax about 35%. Nondisplaced rib fracture of the left 7, eighth and ninth rib.  These results were called by telephone at the time of interpretation on 02/14/2015 at 9:18 am to Dr. Francine Graven , who verbally acknowledged these  results.   Electronically Signed   By: Lahoma Crocker M.D.   On: 02/14/2015 09:18   Ct Head Wo Contrast 02/14/2015   CLINICAL DATA:  Frequent falls with weakness. Evaluate for head or neck injury.  EXAM: CT HEAD WITHOUT CONTRAST  CT CERVICAL SPINE WITHOUT  CONTRAST  TECHNIQUE: Multidetector CT imaging of the head and cervical spine was performed following the standard protocol without intravenous contrast. Multiplanar CT image reconstructions of the cervical spine were also generated.  COMPARISON:  Head and face CT 10/27/2014  FINDINGS: CT HEAD FINDINGS  Skull and Sinuses:Negative for fracture or destructive process. The mastoids, middle ears, and imaged paranasal sinuses are clear.  Orbits: No acute abnormality.  Brain: No evidence of acute infarction, hemorrhage, hydrocephalus, or mass lesion/mass effect. There is brain atrophy with prominent sulcal widening at the vertex. No lobar predilection. Chronic small-vessel disease with ischemic gliosis throughout the bilateral cerebral white matter. There is either a remote lacunar infarct or dilated perivascular space at the left basal ganglia level.  CT CERVICAL SPINE FINDINGS  Left pneumothorax which is small at the apex. This is known from previous chest x-ray.  No evidence of acute cervical spine fracture subluxation. No gross cervical canal hematoma or prevertebral edema. There is diffuse degenerative disc disease with large disc osteophyte complexes at C4-5 and C5-6, contacting the ventral cord. Interbody fusion/ankylosis at C6-7 with bilateral foraminal stenosis from uncovertebral spurs.  IMPRESSION: 1. No evidence of acute intracranial or cervical spine injury. 2. Brain atrophy and chronic small vessel disease. 3. Known left pneumothorax.   Electronically Signed   By: Monte Fantasia M.D.   On: 02/14/2015 10:49   Ct Chest W Contrast 02/14/2015   CLINICAL DATA:  Fall 2 weeks ago. Pneumothorax on chest x-ray. Abdominal pain.  EXAM: CT CHEST, ABDOMEN, AND PELVIS WITH CONTRAST  TECHNIQUE: Multidetector CT imaging of the chest, abdomen and pelvis was performed following the standard protocol during bolus administration of intravenous contrast.  CONTRAST:  178mL OMNIPAQUE IOHEXOL 300 MG/ML  SOLN  COMPARISON:  None.   FINDINGS: CT CHEST FINDINGS  THORACIC INLET/BODY WALL:  No traumatic findings. Sub cm left thyroid nodule considered incidental based on size.  MEDIASTINUM:  Normal heart size. No pericardial effusion. Diffuse atherosclerosis, including the coronary arteries. No acute vascular abnormality. No adenopathy.  LUNG WINDOWS:  Non loculated pneumothorax on the left, approximateyl 25%. There is layering left pleural effusion which is water density. Lingular atelectasis. No evidence of lung contusion.  2 mm nodule in the right lower lobe on image 30, too small for imaging follow-up.  OSSEOUS:  See below  CT ABDOMEN AND PELVIS FINDINGS  BODY WALL: Unremarkable.  Liver: No focal abnormality.  Biliary: Intrahepatic biliary duct enlargement, with abrupt tapering at the level of the pancreas  Pancreas: There is an indistinct distortion at the pancreatic neck without measurable mass. There is marked atrophy and pancreatic ductal enlargement involving the tail and body. At the level of the distortion, there is distortion in narrowing of the upper SMV and hepatic artery. There is also apparent tethering of the pylorus. No adenopathy or evidence of peritoneal disease.  Spleen: Unremarkable.  Adrenals: Unremarkable.  Kidneys and ureters: Bilateral renal cortical thinning. No evidence of injury. No hydronephrosis.  Bladder: Unremarkable.  Reproductive: Prostate brachytherapy seeds  Bowel: No evidence of injury  Retroperitoneum: No mass or adenopathy.  Peritoneum: No free fluid or gas.  Vascular: No acute findings. Extensive atherosclerosis of the aorta and branch vessels, with diffuse  narrowing of the upper SMA. 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  OSSEOUS: Left posterior to lateral seventh through eleventh rib fractures without notable displacement.  IMPRESSION: 1. Moderate left hydropneumothorax with gas component approximately 25%. 2. Left seventh through eleventh rib fractures with up to  mild displacement. 3. Pancreatic neck abnormality with biliary and pancreatic duct obstruction and SMV narrowing. 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 Need correlation with specific surgical history and previous imaging. EUS and biopsy may be needed.   Electronically Signed   By: Monte Fantasia M.D.   On: 02/14/2015 11:08   Ct Cervical Spine Wo Contrast 02/14/2015   CLINICAL DATA:  Frequent falls with weakness. Evaluate for head or neck injury.  EXAM: CT HEAD WITHOUT CONTRAST  CT CERVICAL SPINE WITHOUT CONTRAST  TECHNIQUE: Multidetector CT imaging of the head and cervical spine was performed following the standard protocol without intravenous contrast. Multiplanar CT image reconstructions of the cervical spine were also generated.  COMPARISON:  Head and face CT 10/27/2014  FINDINGS: CT HEAD FINDINGS  Skull and Sinuses:Negative for fracture or destructive process. The mastoids, middle ears, and imaged paranasal sinuses are clear.  Orbits: No acute abnormality.  Brain: No evidence of acute infarction, hemorrhage, hydrocephalus, or mass lesion/mass effect. There is brain atrophy with prominent sulcal widening at the vertex. No lobar predilection. Chronic small-vessel disease with ischemic gliosis throughout the bilateral cerebral white matter. There is either a remote lacunar infarct or dilated perivascular space at the left basal ganglia level.  CT CERVICAL SPINE FINDINGS  Left pneumothorax which is small at the apex. This is known from previous chest x-ray.  No evidence of acute cervical spine fracture subluxation. No gross cervical canal hematoma or prevertebral edema. There is diffuse degenerative disc disease with large disc osteophyte complexes at C4-5 and C5-6, contacting the ventral cord. Interbody fusion/ankylosis at C6-7 with bilateral foraminal stenosis from uncovertebral spurs.  IMPRESSION: 1. No evidence of acute intracranial or cervical  spine injury. 2. Brain atrophy and chronic small vessel disease. 3. Known left pneumothorax.   Electronically Signed   By: Monte Fantasia M.D.   On: 02/14/2015 10:49   Ct Abdomen Pelvis W Contrast 02/14/2015   CLINICAL DATA:  Fall 2 weeks ago. Pneumothorax on chest x-ray. Abdominal pain.  EXAM: CT CHEST, ABDOMEN, AND PELVIS WITH CONTRAST  TECHNIQUE: Multidetector CT imaging of the chest, abdomen and pelvis was performed following the standard protocol during bolus administration of intravenous contrast.  CONTRAST:  177mL OMNIPAQUE IOHEXOL 300 MG/ML  SOLN  COMPARISON:  None.  FINDINGS: CT CHEST FINDINGS  THORACIC INLET/BODY WALL:  No traumatic findings. Sub cm left thyroid nodule considered incidental based on size.  MEDIASTINUM:  Normal heart size. No pericardial effusion. Diffuse atherosclerosis, including the coronary arteries. No acute vascular abnormality. No adenopathy.  LUNG WINDOWS:  Non loculated pneumothorax on the left, approximateyl 25%. There is layering left pleural effusion which is water density. Lingular atelectasis. No evidence of lung contusion.  2 mm nodule in the right lower lobe on image 30, too small for imaging follow-up.  OSSEOUS:  See below  CT ABDOMEN AND PELVIS FINDINGS  BODY WALL: Unremarkable.  Liver: No focal abnormality.  Biliary: Intrahepatic biliary duct enlargement, with abrupt tapering at the level of the pancreas  Pancreas: There is an indistinct distortion at the pancreatic neck without measurable mass. There is marked atrophy and pancreatic ductal enlargement involving the tail  and body. At the level of the distortion, there is distortion in narrowing of the upper SMV and hepatic artery. There is also apparent tethering of the pylorus. No adenopathy or evidence of peritoneal disease.  Spleen: Unremarkable.  Adrenals: Unremarkable.  Kidneys and ureters: Bilateral renal cortical thinning. No evidence of injury. No hydronephrosis.  Bladder: Unremarkable.  Reproductive: Prostate  brachytherapy seeds  Bowel: No evidence of injury  Retroperitoneum: No mass or adenopathy.  Peritoneum: No free fluid or gas.  Vascular: No acute findings. Extensive atherosclerosis of the aorta and branch vessels, with diffuse narrowing of the upper SMA. 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  OSSEOUS: Left posterior to lateral seventh through eleventh rib fractures without notable displacement.  IMPRESSION: 1. Moderate left hydropneumothorax with gas component approximately 25%. 2. Left seventh through eleventh rib fractures with up to mild displacement. 3. Pancreatic neck abnormality with biliary and pancreatic duct obstruction and SMV narrowing. 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 Need correlation with specific surgical history and previous imaging. EUS and biopsy may be needed.   Electronically Signed   By: Monte Fantasia M.D.   On: 02/14/2015 11:08    0935:  CXR with left ribs fx and PTX. Pt's VS remain stable, resps easy, Sats 99% R/A.  CT A/P pending. CT chest added.  T/C to [facility] Will, case discussed, including:  HPI, pertinent PM/SHx, VS/PE, dx testing, ED course and treatment:  Agreeable to come to ED for evaluation to admit, likely transfer to [facility]. Pt and family updated regarding plan of care; verb understanding.  1200:  CT scans completed. [facility] has evaluated pt: they will transfer pt to [facility] Trauma service. Pt remains stable. Pt and family agreeable.        Francine Graven, DO 02/16/15 1324

## 2015-02-14 NOTE — ED Notes (Signed)
Pt placed on 2 lpm [facility] per [facility] request.

## 2015-02-14 NOTE — ED Notes (Signed)
Family at bedside. 

## 2015-02-14 NOTE — ED Notes (Signed)
Patient transported to X-ray 

## 2015-02-14 NOTE — Progress Notes (Signed)
Patient arrived to the floor from [facility] hospital, patient placed on monitor and Trauma service made aware patient has arrived to floor. Will monitor patient. Ikaika Showers, Bettina Gavia RN

## 2015-02-14 NOTE — Procedures (Signed)
Chest Tube Insertion Procedure Note  Indications:  Clinically significant Pneumothorax  Pre-operative Diagnosis: Pneumothorax  Post-operative Diagnosis: Pneumothorax and Hemothorax  Procedure Details  Informed consent was obtained for the procedure, including sedation.  Risks of lung perforation, hemorrhage, arrhythmia, and adverse drug reaction were discussed.   After sterile skin prep, using standard technique, a 20 French tube was placed in the left anterolateral 6th rib space.  Findings: 250 ml of serosanguinous fluid obtained  Estimated Blood Loss:  253ml         Specimens:  None              Complications:  None; patient tolerated the procedure well.         Disposition: In SDU at bedside in stable condition         Condition: stable  Attending Attestation: I performed the procedure.

## 2015-02-14 NOTE — Progress Notes (Signed)
Report given to [facility] on [facility], will transport [facility] bed to [id number]. Ramzey Petrovic, Bettina Gavia RN

## 2015-02-14 NOTE — Progress Notes (Signed)
Confirmed with pt/family pcp is Kelton Pillar, seen at [facility] and ortho is [facility]ayama [facility] updated

## 2015-02-14 NOTE — H&P (Signed)
Dustin [name] is an 81 y.o. male.      Chief Complaint: Fall with pain, multiple rib fractures and left pneumothorax HPI: 81 y/o who fell at home 2 weeks ago, on 02/03/15.  He has been having trouble moving around since and it hurts to breath or cough.  Films on admit show fracture of the 7th, 8th and 9th ribs.  With a 35% pneumothorax.   We are ask to see.  He denies SOB, just pain on his left side, some brusing, most on his buttocks.  He has some dementia but does not acknowledge it.  Memory is poor.  He has multiple medical issues including AF and is on Pradaxa.  He is fairly angry at his daughter, because she is trying to fill in where his memory fails.  He knows he's in [place] and Mady Gemma is president, the year is 55.  Past Medical History  Diagnosis Date  . Pancreatitis   . A-fib   . Cancer   . Prostate cancer   . Dementia   . Stroke   . Hypertension   . Diabetes mellitus without complication   . Hyperlipidemia   . Poor balance   . Bilateral knee pain     Past Surgical History  Procedure Laterality Date  . Eye surgery    . Appendectomy    . Cholecystectomy    . Pancreas surgery      History reviewed. No pertinent family history. Social History:  reports that he has quit smoking. He has never used smokeless tobacco. He reports that he drinks alcohol. He reports that he does not use illicit drugs.  Allergies: No Known Allergies   Prior to Admission medications   Medication Sig Start Date End Date Taking? Authorizing Provider  bimatoprost (LUMIGAN) 0.01 % SOLN Place 1 drop into both eyes at bedtime.   Yes Historical Provider, MD  brimonidine (ALPHAGAN P) 0.1 % SOLN Place 1 drop into both eyes 2 (two) times daily.    Yes Historical Provider, MD  dabigatran (PRADAXA) 150 MG CAPS capsule Take 150 mg by mouth 2 (two) times daily.   Yes Historical Provider, MD  docusate sodium (COLACE) 100 MG capsule Take 200 mg by mouth daily.   Yes Historical Provider, MD  lovastatin  (MEVACOR) 40 MG tablet Take 40 mg by mouth at bedtime.   Yes Historical Provider, MD  metFORMIN (GLUCOPHAGE) 500 MG tablet Take 500 mg by mouth daily.   Yes Historical Provider, MD  omeprazole (PRILOSEC) 20 MG capsule Take 20 mg by mouth daily as needed (heartburn, acid reflux).   Yes Historical Provider, MD  traMADol (ULTRAM) 50 MG tablet Take 1 tablet by mouth every 6 (six) hours as needed. pain 02/09/15  Yes Historical Provider, MD  valsartan-hydrochlorothiazide (DIOVAN-HCT) 80-12.5 MG per tablet Take 1 tablet by mouth daily.   Yes Historical Provider, MD  vismodegib (ERIVEDGE) 150 MG capsule Take 150 mg by mouth daily.   Yes Historical Provider, MD  oxyCODONE-acetaminophen (PERCOCET/ROXICET) 5-325 MG per tablet Take 1 tablet by mouth every 4 (four) hours as needed for severe pain. Patient not taking: Reported on 02/14/2015 10/27/14   Jola Schmidt, MD     Results for orders placed or performed during the hospital encounter of 02/14/15 (from the past 48 hour(s))  CBC with Differential     Status: None   Collection Time: 02/14/15  8:25 AM  Result Value Ref Range   WBC 6.2 4.0 - 10.5 K/uL   RBC 4.23 4.22 -  5.81 MIL/uL   Hemoglobin 13.6 13.0 - 17.0 g/dL   HCT 41.1 39.0 - 52.0 %   MCV 97.2 78.0 - 100.0 fL   MCH 32.2 26.0 - 34.0 pg   MCHC 33.1 30.0 - 36.0 g/dL   RDW 12.6 11.5 - 15.5 %   Platelets 191 150 - 400 K/uL   Neutrophils Relative % 57 43 - 77 %   Neutro Abs 3.5 1.7 - 7.7 K/uL   Lymphocytes Relative 30 12 - 46 %   Lymphs Abs 1.8 0.7 - 4.0 K/uL   Monocytes Relative 8 3 - 12 %   Monocytes Absolute 0.5 0.1 - 1.0 K/uL   Eosinophils Relative 5 0 - 5 %   Eosinophils Absolute 0.3 0.0 - 0.7 K/uL   Basophils Relative 0 0 - 1 %   Basophils Absolute 0.0 0.0 - 0.1 K/uL   Dg Ribs Unilateral W/chest Left  02/14/2015   CLINICAL DATA:  Multiple recent falls, left side pain  EXAM: LEFT RIBS AND CHEST - 3+ VIEW  COMPARISON:  1/ 20/16  FINDINGS: Four views left ribs submitted. No pulmonary edema.  There is mild left basilar atelectasis or lung contusion. There is left pneumothorax about 35%. Nondisplaced rib fracture of the left 7, eighth and ninth rib.  IMPRESSION: No pulmonary edema. There is mild left basilar atelectasis or lung contusion. There is left pneumothorax about 35%. Nondisplaced rib fracture of the left 7, eighth and ninth rib.  These results were called by telephone at the time of interpretation on 02/14/2015 at 9:18 am to Dr. Francine Graven , who verbally acknowledged these results.   Electronically Signed   By: Lahoma Crocker M.D.   On: 02/14/2015 09:18    Review of Systems  Constitutional: Negative.   HENT: Negative.   Eyes: Negative.   Respiratory:       Hurts to cough or breath deep.  Cardiovascular: Positive for chest pain.       Pain on the left side since his fall 2 weeks ago.  Gastrointestinal: Negative.   Genitourinary: Negative.   Musculoskeletal: Negative.   Skin: Negative.   Neurological: Negative.   Endo/Heme/Allergies: Negative.   Psychiatric/Behavioral: Positive for memory loss.    Blood pressure 177/85, pulse 66, temperature 98 F (36.7 C), temperature source Oral, resp. rate 16, SpO2 96 %. Physical Exam  Constitutional: He appears well-developed and well-nourished. No distress.  HENT:  Head: Normocephalic and atraumatic.  Nose: Nose normal.  Eyes: Conjunctivae and EOM are normal. Right eye exhibits no discharge. Left eye exhibits no discharge. No scleral icterus.  Neck: Normal range of motion. Neck supple. No JVD present. No tracheal deviation present. No thyromegaly present.  Cardiovascular: Normal rate, regular rhythm and normal heart sounds.   No murmur heard. Distal pulses are weak  Respiratory: Effort normal and breath sounds normal. No respiratory distress. He has no wheezes. He has no rales. He exhibits tenderness.  GI: Soft. Bowel sounds are normal. He exhibits no distension and no mass. There is no tenderness. There is no rebound and no  guarding.  Large RUQ going beyond the midline scar.    Musculoskeletal: He exhibits no edema.  Lymphadenopathy:    He has no cervical adenopathy.  Neurological: He is alert. No cranial nerve deficit.  Skin: Skin is warm and dry. No rash noted. He is not diaphoretic. No erythema. No pallor.  Some brusing stomach and buttocks on left.     Assessment/Plan 1.  Fall with multiple fracture's  left 7th, 8th, and 9th ribs. 2. 35% left pneumothorax 3.  Atrial fibrillation with Pradaxa for anticoagulation, last dose yesterday. 4.  Dementia (poor memory, orientation) 5.  CA prostate 6.  Hypertension 7.  AODM 8. Hx of stroke 9.  Prior pancreatectomy or portion of the pancreas/a new possible pancreatic cancer.   Plan:  He is neurologically stable and having no respiratory issues.  More CT's ordered and pending.  Will transfer to [facility] and Trauma when they are done.  CT of the chest shows, 25% left hydropneumothorax, Left seventh through eleventh rib fractures with up to mild displacement.  Pancreatic neck abnormality with biliary and pancreatic duct obstruction and SMV narrowing. 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 Need correlation with specific surgical history and previous imaging. EUS and biopsy may be needed.  He has seen a Dr. Aviva Signs at [facility] about pancreatic changes noted during his work up for pancreatic cancer.  Judge Duque 02/14/2015, 9:36 AM

## 2015-02-14 NOTE — ED Notes (Addendum)
Patient fell in his drive way in January and went to [facility] for rib pain. Patient was discharged home with tramadol that has not helped at all. Patient does not remember the results of the x-ray. X-ray showed no traumatic injury. Patient states he is unable to sleep or move around due to pain.

## 2015-02-14 NOTE — ED Notes (Signed)
Bed: QU04 Expected date:  Expected time:  Means of arrival:  Comments: EMS- 81yo M, rib pain

## 2015-02-14 NOTE — Progress Notes (Signed)
Freq checks on Pt, still stable on monitor, afib rate controlled. Pt remains lethargic, confused, still follows commands. Only complaint is coldness. Warm blanket.

## 2015-02-15 ENCOUNTER — Inpatient Hospital Stay (HOSPITAL_COMMUNITY): Payer: Medicare PPO

## 2015-02-15 DIAGNOSIS — S2242XA Multiple fractures of ribs, left side, initial encounter for closed fracture: Secondary | ICD-10-CM | POA: Diagnosis present

## 2015-02-15 DIAGNOSIS — W19XXXA Unspecified fall, initial encounter: Secondary | ICD-10-CM | POA: Diagnosis present

## 2015-02-15 LAB — BASIC METABOLIC PANEL
Anion gap: 9 (ref 5–15)
BUN: 27 mg/dL — AB (ref 6–20)
CO2: 29 mmol/L (ref 22–32)
CREATININE: 1.14 mg/dL (ref 0.61–1.24)
Calcium: 9.1 mg/dL (ref 8.9–10.3)
Chloride: 101 mmol/L (ref 101–111)
GFR calc Af Amer: >60 mL/min (ref >60)
GFR calc non Af Amer: 57 mL/min — ABNORMAL LOW (ref >60)
Glucose, Bld: 115 mg/dL — ABNORMAL HIGH (ref 70–99)
Potassium: 4 mmol/L (ref 3.5–5.1)
Sodium: 139 mmol/L (ref 135–145)

## 2015-02-15 LAB — CBC
HEMATOCRIT: 37.6 % — AB (ref 39.0–52.0)
Hemoglobin: 12.5 g/dL — ABNORMAL LOW (ref 13.0–17.0)
MCH: 31.7 pg (ref 26.0–34.0)
MCHC: 33.2 g/dL (ref 30.0–36.0)
MCV: 95.4 fL (ref 78.0–100.0)
Platelets: 169 10*3/uL (ref 150–400)
RBC: 3.94 MIL/uL — ABNORMAL LOW (ref 4.22–5.81)
RDW: 12.5 % (ref 11.5–15.5)
WBC: 5.9 10*3/uL (ref 4.0–10.5)

## 2015-02-15 LAB — GLUCOSE, CAPILLARY
GLUCOSE-CAPILLARY: 103 mg/dL — AB (ref 70–99)
Glucose-Capillary: 92 mg/dL (ref 70–99)
Glucose-Capillary: 96 mg/dL (ref 70–99)
Glucose-Capillary: 120 mg/dL — ABNORMAL HIGH (ref 70–99)

## 2015-02-15 MED ORDER — HALOPERIDOL LACTATE 5 MG/ML IJ SOLN
5.0000 mg | Freq: Once | INTRAMUSCULAR | Status: AC
  Administered 2015-02-15: 5 mg via INTRAVENOUS
  Filled 2015-02-15: qty 1

## 2015-02-15 MED ORDER — TRAMADOL HCL 50 MG PO TABS
100.0000 mg | ORAL_TABLET | Freq: Four times a day (QID) | ORAL | Status: DC
  Administered 2015-02-15 (×2): 100 mg via ORAL
  Filled 2015-02-15 (×3): qty 2

## 2015-02-15 MED ORDER — HALOPERIDOL LACTATE 5 MG/ML IJ SOLN
5.0000 mg | Freq: Four times a day (QID) | INTRAMUSCULAR | Status: DC | PRN
  Administered 2015-02-15: 5 mg via INTRAVENOUS
  Filled 2015-02-15: qty 1

## 2015-02-15 MED ORDER — NAPROXEN 250 MG PO TABS
500.0000 mg | ORAL_TABLET | Freq: Two times a day (BID) | ORAL | Status: DC
  Administered 2015-02-15 – 2015-02-18 (×4): 500 mg via ORAL
  Filled 2015-02-15: qty 1
  Filled 2015-02-15: qty 2
  Filled 2015-02-15 (×5): qty 1

## 2015-02-15 NOTE — Progress Notes (Signed)
Patient ID: Dustin [name], male   DOB: 1927-05-20, 81 y.o.   MRN: [id number]   LOS: 1 day   Subjective: C/o undertreated pain but otherwise ok. Confused.   Objective: Vital signs in last 24 hours: Temp:  [97.6 F (36.4 C)-98.2 F (36.8 C)] 98.2 F (36.8 C) (05/10 0745) Pulse Rate:  [40-93] 58 (05/10 0745) Resp:  [10-23] 13 (05/10 0745) BP: (119-169)/(62-124) 153/73 mmHg (05/10 0745) SpO2:  [80 %-100 %] 97 % (05/10 0809) Last BM Date: 02/13/15   CT No air leak 244ml/insertion   Laboratory  CBC  Recent Labs  02/14/15 0825 02/14/15 1022 02/15/15 0215  WBC 6.2  --  5.9  HGB 13.6 13.9 12.5*  HCT 41.1 41.0 37.6*  PLT 191  --  169   BMET  Recent Labs  02/14/15 0825 02/14/15 1022 02/15/15 0215  NA 140 139 139  K 3.7 3.7 4.0  CL 103 98* 101  CO2 30  --  29  GLUCOSE 136* 133* 115*  BUN 41* 39* 27*  CREATININE 1.22 1.30* 1.14  CALCIUM 9.5  --  9.1    Radiology Results PORTABLE CHEST - 1 VIEW  COMPARISON: 02/14/2015  FINDINGS: Left-sided thoracostomy catheter is again identified but appears to have been repositioned. No pneumothorax is noted. No sizable effusion is seen. The lungs are clear. No bony abnormality is noted.  IMPRESSION: No pneumothorax is identified. No acute abnormality is seen.   Electronically Signed  By: Inez Catalina M.D.  On: 02/15/2015 08:04   Physical Exam General appearance: alert and no distress Resp: diminished breath sounds LUL, slight Cardio: irregularly irregular rhythm GI: normal findings: bowel sounds normal and soft, non-tender   Assessment/Plan: Fall Multiple left rib fxs w/PTX s/p CT -- To water seal, need IS Multiple medical problems -- Home meds FEN -- Not asking for pain meds, likely 2/2 dementia. Will schedule tramadol. Give diet. VTE -- SCD's, Lovenox Dispo -- PT/OT    Lisette Abu, PA-C Pager: [phone] General Trauma PA Pager: [phone]  02/15/2015

## 2015-02-15 NOTE — Evaluation (Signed)
Occupational Therapy Evaluation Patient Details Name: Dustin [name] MRN: [id number] DOB: 1926-12-07 Today's Date: 02/15/2015    History of Present Illness 81 y.o. who fell at home 2 weeks ago, on 02/03/15. Admitted with trouble moving around since and it hurts to breath or cough. Films on admit show fracture of the 7th, 8th and 9th ribs. With a 35% pneumothorax.PMHx of AF. Pt with chest tube.   Clinical Impression   Pt admitted with above. Feel pt will benefit from acute OT to increase independence with BADLs, prior to d/c. Recommending SNF for rehab. Discussed d/c plan with family.    Follow Up Recommendations  SNF;Supervision/Assistance - 24 hour    Equipment Recommendations  Other (comment) (defer to next venue)    Recommendations for Other Services       Precautions / Restrictions Precautions Precautions: Fall Precaution Comments: chest tube Restrictions Weight Bearing Restrictions: No      Mobility Bed Mobility Overal bed mobility: Needs Assistance Bed Mobility: Supine to Sit;Sit to Supine Supine to sit: Mod assist Sit to supine: Min assist   General bed mobility comments: Cues for technique for bed mobility. assist to scoot [facility] and cues for technique. Assist with trunk and LEs when going from supine to sitting position.  Transfers Overall transfer level: Needs assistance   Transfers: Sit to/from Stand Sit to Stand: Min assist         General transfer comment: cues for technique.     Balance History of fall; Min guard-Min assist for ambulation in room with RW (very short distance). Min assist for sit to stand.                         ADL Overall ADL's : Needs assistance/impaired     Grooming: Set up;Supervision/safety;Sitting   Upper Body Bathing: Set up;Supervision/ safety;Sitting           Lower Body Dressing: Min guard;Minimal assistance;Sit to/from stand   Toilet Transfer: Minimal assistance;RW;Ambulation (bed)            Functional mobility during ADLs: Min guard;Minimal assistance;Rolling walker General ADL Comments: Cues for pt not to pull on chest tube. Pt ambulated a short distance from bed with RW. Pt able to don/doff socks sitting EOB.     Vision     Perception     Praxis      Pertinent Vitals/Pain Pain Assessment: Faces Pain Score:  (reported a 10) Faces Pain Scale: Hurts whole lot Pain Location: ribs Pain Descriptors / Indicators: Grimacing Pain Intervention(s): Monitored during session;Repositioned   O2 in 90s-100 at end of session on RA. Monitor read O2 in 80s during session on RA, but unsure of accuracy. HR around 112 at beginning of session.     Hand Dominance     Extremity/Trunk Assessment Upper Extremity Assessment Upper Extremity Assessment: Overall WFL for tasks assessed   Lower Extremity Assessment Lower Extremity Assessment: Defer to PT evaluation       Communication Communication Communication: No difficulties   Cognition Arousal/Alertness: Awake/alert Behavior During Therapy: WFL for tasks assessed/performed Overall Cognitive Status: Impaired/Different from baseline-has history of dementia at baseline Area of Impairment: Orientation;Safety/judgement;Problem solving Orientation Level: Disoriented to;Place;Time   Memory: Decreased short-term memory   Safety/Judgement: Decreased awareness of safety   Problem Solving: Slow processing;Difficulty sequencing;Requires verbal cues     General Comments       Exercises       Shoulder Instructions      Home Living Family/patient  expects to be discharged to:: Private residence Living Arrangements: Spouse/significant other   Type of Home: House Home Access: Stairs to enter     Home Layout: One level               Home Equipment: Environmental consultant - 2 wheels;Shower seat   Additional Comments: Not sure how much his wife has to do for him.      Prior Functioning/Environment Level of Independence: Independent  with assistive device(s)        Comments: pt reports independent with RW in the home    OT Diagnosis: Acute pain;Generalized weakness   OT Problem List: Decreased strength;Decreased activity tolerance;Impaired balance (sitting and/or standing);Decreased cognition;Decreased knowledge of use of DME or AE;Decreased knowledge of precautions;Pain;Decreased safety awareness   OT Treatment/Interventions: Self-care/ADL training;Therapeutic exercise;DME and/or AE instruction;Therapeutic activities;Cognitive remediation/compensation;Patient/family education;Balance training    OT Goals(Current goals can be found in the care plan section) Acute Rehab OT Goals Patient Stated Goal: not stated OT Goal Formulation: With family Time For Goal Achievement: 02/22/15 Potential to Achieve Goals: Good ADL Goals Pt Will Perform Lower Body Bathing: sit to/from stand;with set-up;with supervision Pt Will Perform Lower Body Dressing: with set-up;with supervision;sit to/from stand Pt Will Transfer to Toilet: with supervision;ambulating Pt Will Perform Toileting - Clothing Manipulation and hygiene: with supervision;sit to/from stand Additional ADL Goal #1: Pt will perform bed mobility at supervision level as precursor for ADLs.  OT Frequency: Min 2X/week   Barriers to D/C:            Co-evaluation              End of Session Equipment Utilized During Treatment: Gait belt;Rolling walker;Other (comment) (O2 for part of session.) Nurse Communication: Mobility status;Other (comment) (check on chest tube; pt not on O2/O2 sats)  Activity Tolerance: Patient tolerated treatment well Patient left: in bed;with call bell/phone within reach;with bed alarm set;with family/visitor present   Time: 6967-8938 OT Time Calculation (min): 17 min Charges:  OT General Charges $OT Visit: 1 Procedure OT Evaluation $Initial OT Evaluation Tier I: 1 Procedure G-CodesBenito Mccreedy OTR/L [phone] 02/15/2015,  12:50 PM

## 2015-02-15 NOTE — Progress Notes (Signed)
Patient extremely agitated. Believes we are "going to put him in a box" and continually states "you are going to murder me and dump me." Will not listen to reason. HR previously 90-low 100s. Patient HR now 120-140s and sustaining. Trauma called. STAT CXR order received. Will continue to monitor.

## 2015-02-15 NOTE — Evaluation (Signed)
Physical Therapy Evaluation Patient Details Name: Braxtin Bamba MRN: [id number] DOB: Oct 15, 1926 Today's Date: 02/15/2015   History of Present Illness  81 y/o who fell at home 2 weeks ago, on 02/03/15. Admitted with trouble moving around since and it hurts to breath or cough. Films on admit show fracture of the 7th, 8th and 9th ribs. With a 35% pneumothorax.PMHx of AF  Clinical Impression  Pt admitted with/for problems above as a result of a mechanical fall.  Pt currently limited functionally due to the problems listed. ( See problems list.)   Pt will benefit from PT to maximize function and safety in order to get ready for next venue listed below.     Follow Up Recommendations SNF    Equipment Recommendations  Other (comment) (TBA)    Recommendations for Other Services       Precautions / Restrictions Precautions Precautions: Fall Restrictions Weight Bearing Restrictions: No      Mobility  Bed Mobility Overal bed mobility: Needs Assistance Bed Mobility: Rolling;Sidelying to Sit Rolling: Min assist Sidelying to sit: Mod assist       General bed mobility comments: cues for guidance, assist for trunk  Transfers Overall transfer level: Needs assistance   Transfers: Sit to/from Stand Sit to Stand: Min assist         General transfer comment: stability assist  Ambulation/Gait Ambulation/Gait assistance: Min assist Ambulation Distance (Feet): 180 Feet Assistive device:  (W/C to push) Gait Pattern/deviations: Step-through pattern;Decreased step length - right;Decreased step length - left;Decreased stride length;Trunk flexed Gait velocity: slower Gait velocity interpretation: Below normal speed for age/gender General Gait Details: mildly unsteady with short choppy steps at times.  flexed posture that pt can correct if cued.  Stairs            Wheelchair Mobility    Modified Rankin (Stroke Patients Only)       Balance Overall balance assessment:  Needs assistance Sitting-balance support: Feet supported;No upper extremity supported Sitting balance-Leahy Scale: Fair Sitting balance - Comments: tends toward slow list posteriorly   Standing balance support: No upper extremity supported Standing balance-Leahy Scale: Poor Standing balance comment: needs assistive device                              Pertinent Vitals/Pain Pain Assessment: Faces Faces Pain Scale: Hurts even more Pain Location: ribs L>R Pain Descriptors / Indicators: Aching;Grimacing Pain Intervention(s): Monitored during session;Repositioned    Home Living Family/patient expects to be discharged to:: Private residence Living Arrangements: Spouse/significant other   Type of Home: House Home Access: Stairs to enter     Home Layout: One level Home Equipment: Environmental consultant - 2 wheels;Shower seat Additional Comments: Not sure how much his wife has to do for him.    Prior Function Level of Independence: Independent with assistive device(s) (pt reports independent in the home with RW)               Hand Dominance        Extremity/Trunk Assessment               Lower Extremity Assessment: Overall WFL for tasks assessed;Generalized weakness (mild LE and truncal weakness based on movement in bed)         Communication   Communication: No difficulties  Cognition Arousal/Alertness: Awake/alert Behavior During Therapy: WFL for tasks assessed/performed Overall Cognitive Status: History of cognitive impairments - at baseline  General Comments General comments (skin integrity, edema, etc.): Maintained SpO2 at 92% on RA for short period while ambulating.    Exercises        Assessment/Plan    PT Assessment Patient needs continued PT services  PT Diagnosis Generalized weakness;Acute pain   PT Problem List Decreased strength;Decreased activity tolerance;Decreased balance;Decreased mobility;Decreased knowledge of  use of DME;Decreased safety awareness;Pain  PT Treatment Interventions DME instruction;Gait training;Functional mobility training;Therapeutic activities;Balance training;Patient/family education   PT Goals (Current goals can be found in the Care Plan section) Acute Rehab PT Goals Patient Stated Goal: Get back home PT Goal Formulation: With patient Time For Goal Achievement: 03/01/15 Potential to Achieve Goals: Fair    Frequency Min 3X/week   Barriers to discharge        Co-evaluation               End of Session Equipment Utilized During Treatment: Oxygen Activity Tolerance: Patient tolerated treatment well Patient left: in chair;with call bell/phone within reach Nurse Communication: Mobility status         Time: 1020-1049 PT Time Calculation (min) (ACUTE ONLY): 29 min   Charges:   PT Evaluation $Initial PT Evaluation Tier I: 1 Procedure PT Treatments $Gait Training: 8-22 mins   PT G Codes:        Azeneth Carbonell, Tessie Fass 02/15/2015, 11:14 AM   02/15/2015  Donnella Sham, PT [phone]  (pager)

## 2015-02-15 NOTE — Progress Notes (Signed)
UR completed.  Awaiting PT/OT evals to determine best next level of care and d/c needs.   Sandi Mariscal, RN BSN [place] CCM Trauma/Neuro ICU Case Manager [phone]

## 2015-02-16 ENCOUNTER — Inpatient Hospital Stay (HOSPITAL_COMMUNITY): Payer: Medicare PPO

## 2015-02-16 DIAGNOSIS — F0391 Unspecified dementia with behavioral disturbance: Secondary | ICD-10-CM | POA: Diagnosis present

## 2015-02-16 DIAGNOSIS — F03918 Unspecified dementia, unspecified severity, with other behavioral disturbance: Secondary | ICD-10-CM | POA: Diagnosis present

## 2015-02-16 LAB — URINALYSIS, ROUTINE W REFLEX MICROSCOPIC
Glucose, UA: NEGATIVE mg/dL
Hgb urine dipstick: NEGATIVE
KETONES UR: 15 mg/dL — AB
Leukocytes, UA: NEGATIVE
Nitrite: NEGATIVE
Protein, ur: NEGATIVE mg/dL
Specific Gravity, Urine: 1.026 (ref 1.005–1.030)
Urobilinogen, UA: 2 mg/dL — ABNORMAL HIGH (ref 0.0–1.0)
pH: 5.5 (ref 5.0–8.0)

## 2015-02-16 LAB — GLUCOSE, CAPILLARY: GLUCOSE-CAPILLARY: 172 mg/dL — AB (ref 70–99)

## 2015-02-16 MED ORDER — LORAZEPAM 2 MG/ML IJ SOLN
1.0000 mg | Freq: Four times a day (QID) | INTRAMUSCULAR | Status: DC | PRN
  Administered 2015-02-16: 1 mg via INTRAVENOUS
  Filled 2015-02-16: qty 1

## 2015-02-16 MED ORDER — TRAMADOL HCL 50 MG PO TABS
50.0000 mg | ORAL_TABLET | Freq: Four times a day (QID) | ORAL | Status: DC
  Administered 2015-02-16 – 2015-02-17 (×3): 50 mg via ORAL
  Filled 2015-02-16 (×3): qty 1

## 2015-02-16 MED ORDER — TRAZODONE HCL 50 MG PO TABS
50.0000 mg | ORAL_TABLET | Freq: Every day | ORAL | Status: DC
  Administered 2015-02-16 – 2015-02-17 (×2): 50 mg via ORAL
  Filled 2015-02-16 (×3): qty 1

## 2015-02-16 NOTE — Progress Notes (Signed)
Patient with poor urinary output. 175 total output for day shift 7a-7p. Pt bladder scanned and it showed 382 in bladder. Dr. Hulen Skains called. New orders received. Will continue to monitor.

## 2015-02-16 NOTE — Progress Notes (Signed)
Pt continues to be agitated and he is attempting to pull lines and tubes with safety mittens on. Multiple attempts to reorient pt have been unsuccessful. MD notified. New orders received for 5 mg haldol IV. Will administer and continue to monitor.

## 2015-02-16 NOTE — Clinical Social Work Note (Addendum)
Clinical Social Work Assessment  Patient Details  Name: Dustin [name] MRN: [id number] Date of Birth: 03/27/1927  Date of referral:  02/16/15               Reason for consult:  Trauma, Facility Placement              Housing/Transportation Living arrangements for the past 2 months:  [place] of Information:  Adult Children Dustin [name], [phone]) Patient Interpreter Needed:  None Criminal Activity/Legal Involvement Pertinent to Current Situation/Hospitalization:  No - Comment as needed Significant Relationships:  Spouse Lives with:  Adult Children, Spouse (Pt moved from [facility] to [place] in December to lives with his daughter Dustin [name]) Need for family participation in patient care:  Yes (Comment)  Care giving concerns: Post acute placement    Social Worker assessment / plan:  CSW unable to complete SBIRT due to pt's orient level. CSW spoke with the pt's daughter Dustin [name]. CSW introduced self and purpose of the call. CSW discussed SNF rehab. Dustin [name] reported that she would like the pt to go to [facility]. CSW explained that [facility] does not have a contact with the pt insurance. CSW explained insurance and its relation to SNF placement. CSW explained the SNF process. CSW emailed Dustin [name] a SNF list. CSW expressed disappointed with [facility] not being in network with the pt's insurance. CSW answered all questions in which the Dustin [name] inquired about. CSW provided Dustin [name] with contact information for further questions. CSW will continue to follow this pt and assist with discharge as needed.   Insurance information:  Managed Care Personnel officer (Not [employer])) PT Recommendations:  [place] / Referral to community resources:  [place]  Patient/Family's Response to care:  Dustin [name] shared that she pleased with staff compassion for the pt. Dustin [name] acknowledged that the pt will need additional care pior to returning home.    Patient/Family's Understanding of and  Emotional Response to Diagnosis, Current Treatment, and Prognosis: Dustin [name] expressed sorrow about the pt recent fall. Dustin [name] explained that she moved both the pt and his wife to [place] to lives with her to aid in their care. Dustin [name] reported that she did not expect the current fall to led to the pt's rib injury.   Emotional Assessment Appearance:   Unable to assess  Attitude/Demeanor/Rapport:  Unable to assess  Affect (typically observed):  Unable to assess  Orientation:  Oriented to Self Alcohol / Substance use:  Not Applicable Psych involvement (Current and /or in the community):  No (Comment)  Discharge Needs  Concerns to be addressed:  Home Safety Concerns Barriers to Discharge:  No Barriers Identified   Viann Nielson, LCSW 02/16/2015, 10:40 AM

## 2015-02-16 NOTE — Progress Notes (Signed)
Patient ID: Dustin [name], male   DOB: 1927-09-29, 81 y.o.   MRN: [id number]    LOS: 2 days   Subjective: Had a rough night with dementia, combativeness.   Objective: Vital signs in last 24 hours: Temp:  [97.5 F (36.4 C)-99.3 F (37.4 C)] 97.6 F (36.4 C) (05/11 0719) Pulse Rate:  [62-152] 62 (05/11 0717) Resp:  [12-18] 18 (05/11 0717) BP: (116-191)/(76-97) 191/95 mmHg (05/11 0719) SpO2:  [93 %-100 %] 99 % (05/11 0717) Last BM Date: 02/13/15   Radiology Results PORTABLE CHEST - 1 VIEW  COMPARISON: 02/15/2015 and earlier.  FINDINGS: Portable AP semi upright view at 0600 hrs. Stable left chest tube. No pneumothorax or pleural fluid identified. Displaced left lower lateral rib fractures Re identified. Stable lung volumes. Normal cardiac size and mediastinal contours. Visualized tracheal air column is within normal limits. The right lung remains clear. Contrast at the splenic flexure now visible.  IMPRESSION: 1. Stable left chest tube with no pneumothorax or pleural fluid identified. 2. No new cardiopulmonary abnormality.   Electronically Signed  By: Genevie Ann M.D.  On: 02/16/2015 07:54   Physical Exam General appearance: alert, delirious and mild distress Resp: clear to auscultation bilaterally and slightly diminished on left Cardio: regular rate and rhythm GI: normal findings: bowel sounds normal and soft, non-tender   Assessment/Plan: Fall Multiple left rib fxs w/PTX s/p CT -- D/C CT Multiple medical problems -- Home meds FEN -- Check UA in case UTI contributing but no risk factors VTE -- SCD's, Lovenox Dispo -- PT/OT. Likely SNF when delirium improves. Spoke with DTR at length.    Lisette Abu, PA-C Pager: [phone] General Trauma PA Pager: [phone]  02/16/2015

## 2015-02-16 NOTE — Clinical Social Work Placement (Signed)
   CLINICAL SOCIAL WORK PLACEMENT  NOTE  Date:  02/16/2015  Patient Details  Name: Dustin [name] MRN: [id number] Date of Birth: Mar 18, 1927  Clinical Social Work is seeking post-discharge placement for this patient at the [place] level of care (*CSW will initial, date and re-position this form in  chart as items are completed):  Yes   Patient/family provided with [facility] Work Department's list of facilities offering this level of care within the geographic area requested by the patient (or if unable, by the patient's family).  Yes   Patient/family informed of their freedom to choose among providers that offer the needed level of care, that participate in Medicare, Medicaid or managed care program needed by the patient, have an available bed and are willing to accept the patient.  Yes   Patient/family informed of [facility]'s ownership interest in [facility] and [facility], as well as of the fact that they are under no obligation to receive care at these facilities.  PASRR submitted to EDS on 02/16/15     PASRR number received on 02/16/15     Existing PASRR number confirmed on       FL2 transmitted to all facilities in geographic area requested by pt/family on       FL2 transmitted to all facilities within larger geographic area on       Patient informed that his/her managed care company has contracts with or will negotiate with certain facilities, including the following:            Patient/family informed of bed offers received.  Patient chooses bed at       Physician recommends and patient chooses bed at      Patient to be transferred to   on  .  Patient to be transferred to facility by       Patient family notified on   of transfer.  Name of family member notified:        PHYSICIAN       Additional Comment:    _______________________________________________ Greta Doom, LCSW 02/16/2015, 10:57 AM

## 2015-02-16 NOTE — Progress Notes (Signed)
Pt is combative. He is kicking, hitting, and attempting to spit on nursing staff. MD notified. New orders received to administer 5mg  of haldol IV and apply bilateral soft wrist restraints. Pt's daughter, Angela Nevin, has been notified. Will carry out orders and continue to monitor.

## 2015-02-16 NOTE — Progress Notes (Signed)
Pt's daughter Angela Nevin and his son-in-law are at the bedside trying to reorient pt and pt continues to be agitated.

## 2015-02-16 NOTE — Clinical Documentation Improvement (Signed)
Please specify if patient's clinical picture exhibits symptoms or diagnostic findings of any condition listed below. Thank you.  Possible Conditions _______________Dementia with behavior disturbances _______________Dementia without behavior disturbances  Other Not able to determine  Risk Factors: Elderly, Mechanical Fall, pain meds Sign & Symptoms: Progress note:  02/16/2015 "Had a rough night with dementia, combativeness.delirious and mild distress" Nursing staff notes:"Pt is combative. He is kicking, hitting, and attempting to spit on nursing staff"   02/15/15:" agitated and he is attempting to pull lines and tubes with safety mittens on."   Treatment: 02/15/2015: administered 5mg  of haldol IV and applied bilateral soft wrist restraints. Family notified.  Thank you,  Philippa Chester ,RN Clinical Documentation Specialist:  [place] [employer] [facility]

## 2015-02-17 ENCOUNTER — Inpatient Hospital Stay (HOSPITAL_COMMUNITY): Payer: Medicare PPO

## 2015-02-17 LAB — BASIC METABOLIC PANEL
ANION GAP: 10 (ref 5–15)
BUN: 24 mg/dL — ABNORMAL HIGH (ref 6–20)
CALCIUM: 9.1 mg/dL (ref 8.9–10.3)
CO2: 23 mmol/L (ref 22–32)
Chloride: 109 mmol/L (ref 101–111)
Creatinine, Ser: 1.15 mg/dL (ref 0.61–1.24)
GFR calc Af Amer: >60 mL/min (ref >60)
GFR calc non Af Amer: 56 mL/min — ABNORMAL LOW (ref >60)
Glucose, Bld: 96 mg/dL (ref 65–99)
Potassium: 4.2 mmol/L (ref 3.5–5.1)
SODIUM: 142 mmol/L (ref 135–145)

## 2015-02-17 LAB — CBC
HCT: 38.5 % — ABNORMAL LOW (ref 39.0–52.0)
Hemoglobin: 12.7 g/dL — ABNORMAL LOW (ref 13.0–17.0)
MCH: 31.4 pg (ref 26.0–34.0)
MCHC: 33 g/dL (ref 30.0–36.0)
MCV: 95.3 fL (ref 78.0–100.0)
Platelets: 140 10*3/uL — ABNORMAL LOW (ref 150–400)
RBC: 4.04 MIL/uL — ABNORMAL LOW (ref 4.22–5.81)
RDW: 12.5 % (ref 11.5–15.5)
WBC: 6 10*3/uL (ref 4.0–10.5)

## 2015-02-17 LAB — URINE CULTURE
Colony Count: NO GROWTH
Culture: NO GROWTH

## 2015-02-17 MED ORDER — VISMODEGIB 150 MG PO CAPS: 150.0000 mg | ORAL_CAPSULE | Freq: Every day | ORAL | Status: DC

## 2015-02-17 MED ORDER — VALSARTAN-HYDROCHLOROTHIAZIDE 80-12.5 MG PO TABS: 1.0000 | ORAL_TABLET | Freq: Every day | ORAL | Status: DC

## 2015-02-17 MED ORDER — ACETAMINOPHEN 325 MG PO TABS
650.0000 mg | ORAL_TABLET | Freq: Four times a day (QID) | ORAL | Status: DC
  Administered 2015-02-17 – 2015-02-18 (×5): 650 mg via ORAL
  Filled 2015-02-17 (×3): qty 2

## 2015-02-17 MED ORDER — HYDROCHLOROTHIAZIDE 12.5 MG PO CAPS
12.5000 mg | ORAL_CAPSULE | Freq: Every day | ORAL | Status: DC
  Administered 2015-02-17 – 2015-02-18 (×2): 12.5 mg via ORAL
  Filled 2015-02-17 (×2): qty 1

## 2015-02-17 MED ORDER — IRBESARTAN 75 MG PO TABS
75.0000 mg | ORAL_TABLET | Freq: Every day | ORAL | Status: DC
  Administered 2015-02-17 – 2015-02-18 (×2): 75 mg via ORAL
  Filled 2015-02-17 (×2): qty 1

## 2015-02-17 NOTE — Progress Notes (Addendum)
Patient ID: Dustin [name], male   DOB: [birth year], 81 y.o.   MRN: [id number]    Subjective: Had decreased U/O overnight but then spontaneously voided several times with incontinence. Condom cath on now. He only C/O being tired.  Objective: Vital signs in last 24 hours: Temp:  [97.4 F (36.3 C)-98.4 F (36.9 C)] 97.8 F (36.6 C) (05/12 0707) Pulse Rate:  [55-144] 60 (05/12 0707) Resp:  [12-18] 18 (05/12 0707) BP: (127-160)/(64-98) 146/81 mmHg (05/12 0707) SpO2:  [98 %-99 %] 98 % (05/12 0707) Weight:  [82.7 kg (182 lb 5.1 oz)] 82.7 kg (182 lb 5.1 oz) (05/12 0020) Last BM Date: 02/13/15  Intake/Output from previous day: 05/11 0701 - 05/12 0700 In: 860 [P.O.:60; I.V.:800] Out: 675 [Urine:675] Intake/Output this shift: Total I/O In: 405.8 [I.V.:405.8] Out: -   General appearance: cooperative Resp: clear to auscultation bilaterally Chest wall: left sided chest wall tenderness Cardio: regular rate and rhythm and rate 50s GI: soft, NT, ND, +BS Neuro: awake and F/U , oriented to place  Lab Results: CBC   Recent Labs  02/15/15 0215 02/17/15 0248  WBC 5.9 6.0  HGB 12.5* 12.7*  HCT 37.6* 38.5*  PLT 169 140*   BMET  Recent Labs  02/15/15 0215 02/17/15 0248  NA 139 142  K 4.0 4.2  CL 101 109  CO2 29 23  GLUCOSE 115* 96  BUN 27* 24*  CREATININE 1.14 1.15  CALCIUM 9.1 9.1   PT/INR No results for input(s): LABPROT, INR in the last 72 hours. ABG No results for input(s): PHART, HCO3 in the last 72 hours.  Invalid input(s): PCO2, PO2  Studies/Results: Ct Head Wo Contrast  02/16/2015   CLINICAL DATA:  81 year old male with a history of dementia.  EXAM: CT HEAD WITHOUT CONTRAST  TECHNIQUE: Contiguous axial images were obtained from the base of the skull through the vertex without intravenous contrast.  COMPARISON:  Multiple prior CT, most recent 02/14/2015  FINDINGS: Unremarkable appearance of the calvarium without acute fracture or aggressive lesion.  Unremarkable  appearance of the scalp soft tissues.  Unremarkable appearance of the bilateral orbits.  Mastoid air cells are clear.  No significant paranasal sinus disease  No acute intracranial hemorrhage.  No midline shift or mass effect.  Configuration the ventricles is unchanged, with dilation that is not out of proportion to the degree of sulcal prominence.  Confluent hypodensity in the periventricular white matter. More focal hypodensity in the high a left frontal white matter.  Gray-white differentiation is relatively maintained.  Senescent calcifications of the basal ganglia. Focal hypodensity in the left internal capsule.  Dense intracranial atherosclerotic calcifications.  IMPRESSION: No CT evidence of acute intracranial abnormality.  Changes of chronic white matter disease with dense intracranial atherosclerotic calcifications.  Signed,  Dulcy Fanny. Earleen Newport, DO  [facility]  [facility]   Electronically Signed   By: Corrie Mckusick D.O.   On: 02/16/2015 13:14   Dg Chest Port 1 View  02/17/2015   CLINICAL DATA:  Traumatic hemopneumothorax.  EXAM: PORTABLE CHEST - 1 VIEW  COMPARISON:  Radiograph 02/16/2015  FINDINGS: Normal cardiac silhouette. Interval removal of left chest tube. No pneumothorax identified. Left lateral rib fractures noted.  IMPRESSION: Removal of left chest to without evidence pneumothorax.   Electronically Signed   By: Suzy Bouchard M.D.   On: 02/17/2015 08:12   Dg Chest Port 1 View  02/16/2015   CLINICAL DATA:  81 year old male status post traumatic hemopneumothorax. Initial encounter.  EXAM:  PORTABLE CHEST - 1 VIEW  COMPARISON:  02/15/2015 and earlier.  FINDINGS: Portable AP semi upright view at 0600 hrs. Stable left chest tube. No pneumothorax or pleural fluid identified. Displaced left lower lateral rib fractures Re identified. Stable lung volumes. Normal cardiac size and mediastinal contours. Visualized tracheal air column is within normal limits.  The right lung remains clear. Contrast at the splenic flexure now visible.  IMPRESSION: 1. Stable left chest tube with no pneumothorax or pleural fluid identified. 2.  No new cardiopulmonary abnormality.   Electronically Signed   By: Genevie Ann M.D.   On: 02/16/2015 07:54   Dg Chest Port 1 View  02/15/2015   CLINICAL DATA:  Agitation.  Re-evaluate chest tube.  EXAM: PORTABLE CHEST - 1 VIEW  COMPARISON:  02/15/2015 at 6:01 a.m.  FINDINGS: Unmoved left chest tube.  No pneumothorax.  Stable heart size and mediastinal contours. The lungs remain clear. No pleural effusion.  Left-sided rib fractures as described on admission CT.  IMPRESSION: Stable left chest tube positioning. No pneumothorax.   Electronically Signed   By: Monte Fantasia M.D.   On: 02/15/2015 19:02    Anti-infectives: Anti-infectives    None      Assessment/Plan: Fall Multiple left rib fxs w/PTX s/p CT -- CT removed 5/11, CXR with no PTX this AM Multiple medical problems -- Home meds, Pradaxa on hold with falls FEN -- U/O improved overnight as above, U/A neg, urine CX P. If U/O OK today will KVO IVF in AM Delirium -- improving, not trying to get OOB and has not been combative, D/C restraints and see how it goes VTE -- SCD's, Lovenox Dispo -- PT/OT. To floor. Plan SNF placement.   LOS: 3 days    Georganna Skeans, MD, MPH, FACS Trauma: [phone] General Surgery: [phone]  02/17/2015

## 2015-02-17 NOTE — Progress Notes (Signed)
Occupational Therapy Treatment Patient Details Name: Dustin [name] MRN: [id number] DOB: 05/08/1927 Today's Date: 02/17/2015    History of present illness 81 y.o. who fell at home 2 weeks ago, on 02/03/15. Admitted with trouble moving around since and it hurts to breath or cough. Films on admit show fracture of the 7th, 8th and 9th ribs. With a 35% pneumothorax.PMHx of AF. Pt with chest tube.   OT comments  Pt is limited by deconditioning and generalized weakness and requires min/Mod A for functional mobility. Continue to recommend SNF as well as acute OT per POC.   Follow Up Recommendations  SNF;Supervision/Assistance - 24 hour    Equipment Recommendations    Defer to next venue   Recommendations for Other Services      Precautions / Restrictions Precautions Precautions: Fall Restrictions Weight Bearing Restrictions: No       Mobility Bed Mobility               General bed mobility comments: Pt sitting in recliner chair when OT arrived.   Transfers Overall transfer level: Needs assistance Equipment used: Rolling walker (2 wheeled) Transfers: Sit to/from Stand Sit to Stand: Min assist         General transfer comment: Min A to stand from recliner chair. Pt also able to stand from toilet with min/Mod A and perform pericare.     Balance Overall balance assessment: Needs assistance Sitting-balance support: No upper extremity supported;Feet supported Sitting balance-Leahy Scale: Fair     Standing balance support: During functional activity;Single extremity supported Standing balance-Leahy Scale: Poor Standing balance comment: Needs external support to balance                   ADL Overall ADL's : Needs assistance/impaired     Grooming: Set up;Sitting       Lower Body Bathing: Sit to/from stand;Moderate assistance       Lower Body Dressing: Maximal assistance;Sit to/from stand   Toilet Transfer: Moderate assistance;Ambulation;RW;Comfort  height toilet;Grab bars   Toileting- Clothing Manipulation and Hygiene: Sit to/from stand;Moderate assistance       Functional mobility during ADLs: Moderate assistance;Rolling walker General ADL Comments: Pt with deconditioning and generalized weakness. Able to stand from recliner with min/Mod A and reported need to have bowel movement. Mod A to ambulate to bathroom. Pt was able to perform toilet hygiene in standing with min/mod A to balance.                 Cognition  Arousal/Alertness: Awake/Alert Behavior During Therapy: Flat affect Overall Cognitive Status: History of cognitive impairments - at baseline (family reports he is closer to baseline)    Family present and report that he seems close to baseline. They report slow processing at baseline. Pt had flat affect today.                                 Pertinent Vitals/ Pain       Pain Assessment: No/denies pain         Frequency Min 2X/week     Progress Toward Goals  OT Goals(current goals can now be found in the care plan section)  Progress towards OT goals: Progressing toward goals (slowly due to deconditioning)  Acute Rehab OT Goals Patient Stated Goal: to get to my new room OT Goal Formulation: With family Time For Goal Achievement: 02/22/15 Potential to Achieve Goals: Good  Plan Discharge plan remains  appropriate       End of Session Equipment Utilized During Treatment: Gait belt;Rolling walker   Activity Tolerance Patient limited by fatigue   Patient Left Other (comment);with call bell/phone within reach;with family/visitor present (in w/c ready for room transfer)   Nurse Communication          Time: 8338-2505 OT Time Calculation (min): 32 min  Charges: OT General Charges $OT Visit: 1 Procedure OT Treatments $Self Care/Home Management : 23-37 mins  Juluis Rainier 02/17/2015, 2:26 PM   Cyndie Chime, OTR/L Occupational Therapist [phone] (pager)

## 2015-02-17 NOTE — Progress Notes (Signed)
Physical Therapy Treatment Patient Details Name: Dustin [name] MRN: [id number] DOB: Jan 31, 1927 Today's Date: 02/17/2015    History of Present Illness 81 y.o. who fell at home 2 weeks ago, on 02/03/15. Admitted with trouble moving around since and it hurts to breath or cough. Films on admit show fracture of the 7th, 8th and 9th ribs. With a 35% pneumothorax.PMHx of AF. Pt with chest tube.    PT Comments    Progressing slowly.  Able to follow instruction better.  Still weak and deconditioned.   Follow Up Recommendations  SNF     Equipment Recommendations  Other (comment) (TBA next venue)    Recommendations for Other Services       Precautions / Restrictions Precautions Precautions: Fall Restrictions Weight Bearing Restrictions: No    Mobility  Bed Mobility Overal bed mobility: Needs Assistance Bed Mobility: Supine to Sit;Sit to Supine   Sidelying to sit: Mod assist   Sit to supine: Min assist   General bed mobility comments: following cues fairly well, needing guidance  Transfers Overall transfer level: Needs assistance Equipment used: Rolling walker (2 wheeled) Transfers: Sit to/from Stand Sit to Stand: Min assist         General transfer comment: assist to come forward.  Ambulation/Gait Ambulation/Gait assistance: Min assist Ambulation Distance (Feet): 140 Feet Assistive device: Rolling walker (2 wheeled) Gait Pattern/deviations: Step-through pattern Gait velocity: slower   General Gait Details: mildly unsteady with flexed posture and knees at times.  Frequent postural checks   Stairs            Wheelchair Mobility    Modified Rankin (Stroke Patients Only)       Balance Overall balance assessment: Needs assistance Sitting-balance support: No upper extremity supported;Feet supported Sitting balance-Leahy Scale: Fair     Standing balance support: During functional activity;Single extremity supported Standing balance-Leahy Scale:  Poor Standing balance comment: Needs external support to balance                    Cognition Arousal/Alertness: Awake/alert Behavior During Therapy: WFL for tasks assessed/performed Overall Cognitive Status: History of cognitive impairments - at baseline       Memory: Decreased short-term memory   Safety/Judgement: Decreased awareness of safety   Problem Solving: Requires tactile cues;Difficulty sequencing      Exercises      General Comments        Pertinent Vitals/Pain Pain Assessment: Faces Faces Pain Scale: Hurts little more Pain Location: vague around ribs Pain Descriptors / Indicators: Aching;Grimacing Pain Intervention(s): Monitored during session;Repositioned    Home Living                      Prior Function            PT Goals (current goals can now be found in the care plan section) Acute Rehab PT Goals Patient Stated Goal: to get to my new room PT Goal Formulation: With patient Time For Goal Achievement: 03/01/15 Potential to Achieve Goals: Fair Progress towards PT goals: Progressing toward goals    Frequency  Min 3X/week    PT Plan      Co-evaluation             End of Session   Activity Tolerance: Patient tolerated treatment well Patient left: in bed;with call bell/phone within reach     Time: 1725-1742 PT Time Calculation (min) (ACUTE ONLY): 17 min  Charges:  $Gait Training: 8-22 mins  G Codes:      Dusan Lipford, Tessie Fass 02/17/2015, 5:49 PM 02/17/2015  Donnella Sham, [facility] [phone]  (pager)

## 2015-02-17 NOTE — Progress Notes (Signed)
Report called to rn pt going to 6N18 via bed.

## 2015-02-17 NOTE — Progress Notes (Signed)
Pt arrived to 6N18 via wheelchair accompanied by nurse tech and two family members. Pt appears in no discomfort or distress, 2 assist to stand/pivot to the bed. Bed low and locked, side rails up x2, bed alarm on, call light within reach, will continue to monitor. - Roselyn Reef Faithlynn Deeley,RN

## 2015-02-18 MED ORDER — NAPROXEN 500 MG PO TABS: 500.0000 mg | ORAL_TABLET | Freq: Two times a day (BID) | ORAL | Status: DC

## 2015-02-18 MED ORDER — TRAZODONE HCL 50 MG PO TABS: 50.0000 mg | ORAL_TABLET | Freq: Every evening | ORAL | Status: DC | PRN

## 2015-02-18 MED ORDER — ACETAMINOPHEN 325 MG PO TABS: 650.0000 mg | ORAL_TABLET | Freq: Four times a day (QID) | ORAL | Status: DC | PRN

## 2015-02-18 NOTE — Progress Notes (Signed)
Trauma Service Note  Subjective: Not agitated at all today.  Very cooperative and appropriate.  Objective: Vital signs in last 24 hours: Temp:  [98.2 F (36.8 C)-98.4 F (36.9 C)] 98.2 F (36.8 C) (05/13 0702) Pulse Rate:  [62-89] 71 (05/13 0702) Resp:  [16-18] 16 (05/13 0702) BP: (150-180)/(72-89) 180/86 mmHg (05/13 0702) SpO2:  [98 %-100 %] 99 % (05/13 0702) Last BM Date: 02/17/15  Intake/Output from previous day: 05/12 0701 - 05/13 0700 In: 1660 [P.O.:360; I.V.:1300] Out: -  Intake/Output this shift: Total I/O In: 240 [P.O.:240] Out: 100 [Urine:100]  General: No acute distress.  Complaining of no pain.  Lungs: Slightly diminished breath sounds on the left.  CXR looks great from yesterday.  Abd: Benign  Extremities: No DVT signs or symptoms  Neuro: Calm and alert.  Lab Results: CBC   Recent Labs  02/17/15 0248  WBC 6.0  HGB 12.7*  HCT 38.5*  PLT 140*   BMET  Recent Labs  02/17/15 0248  NA 142  K 4.2  CL 109  CO2 23  GLUCOSE 96  BUN 24*  CREATININE 1.15  CALCIUM 9.1   PT/INR No results for input(s): LABPROT, INR in the last 72 hours. ABG No results for input(s): PHART, HCO3 in the last 72 hours.  Invalid input(s): PCO2, PO2  Studies/Results: Ct Head Wo Contrast  02/16/2015   CLINICAL DATA:  81 year old male with a history of dementia.  EXAM: CT HEAD WITHOUT CONTRAST  TECHNIQUE: Contiguous axial images were obtained from the base of the skull through the vertex without intravenous contrast.  COMPARISON:  Multiple prior CT, most recent 02/14/2015  FINDINGS: Unremarkable appearance of the calvarium without acute fracture or aggressive lesion.  Unremarkable appearance of the scalp soft tissues.  Unremarkable appearance of the bilateral orbits.  Mastoid air cells are clear.  No significant paranasal sinus disease  No acute intracranial hemorrhage.  No midline shift or mass effect.  Configuration the ventricles is unchanged, with dilation that is not  out of proportion to the degree of sulcal prominence.  Confluent hypodensity in the periventricular white matter. More focal hypodensity in the high a left frontal white matter.  Gray-white differentiation is relatively maintained.  Senescent calcifications of the basal ganglia. Focal hypodensity in the left internal capsule.  Dense intracranial atherosclerotic calcifications.  IMPRESSION: No CT evidence of acute intracranial abnormality.  Changes of chronic white matter disease with dense intracranial atherosclerotic calcifications.  Signed,  Dulcy Fanny. Earleen Newport, DO  [facility]  [facility]   Electronically Signed   By: Corrie Mckusick D.O.   On: 02/16/2015 13:14   Dg Chest Port 1 View  02/17/2015   CLINICAL DATA:  Traumatic hemopneumothorax.  EXAM: PORTABLE CHEST - 1 VIEW  COMPARISON:  Radiograph 02/16/2015  FINDINGS: Normal cardiac silhouette. Interval removal of left chest tube. No pneumothorax identified. Left lateral rib fractures noted.  IMPRESSION: Removal of left chest to without evidence pneumothorax.   Electronically Signed   By: Suzy Bouchard M.D.   On: 02/17/2015 08:12    Anti-infectives: Anti-infectives    None      Assessment/Plan: s/p  Discharge Needs to go to SNF when bed available.  LOS: 4 days   Kathryne Eriksson. Dahlia Bailiff, MD, FACS [phone] Trauma Surgeon 02/18/2015

## 2015-02-18 NOTE — Progress Notes (Signed)
Discharge to [facility] transported by [facility]. Alert, pleasantly confused, not in any distress prior to discharge.

## 2015-02-18 NOTE — Progress Notes (Signed)
CSW (Clinical Social Worker) prepared pt dc packet and placed with shadow chart. CSW arranged non-emergent ambulance transport. Pt family, pt nurse, and facility informed. CSW signing off.  Noriah Osgood, LCSWA [phone]  

## 2015-02-18 NOTE — Care Management Note (Signed)
Case Management Note  Patient Details  Name: Dustin [name] MRN: [id number] Date of Birth: Feb 08, 1927   In-House Referral:  Clinical Social Work     Choice offered to:  Adult Children  Status of Service:  Completed, signed off  Medicare Important Message Given:  Yes Date Medicare IM Given:  02/18/15 Medicare IM give by:  Sandi Mariscal, RN BSN MHA CCM     Angelina Sheriff, RN 02/18/2015, 10:59 AM

## 2015-02-18 NOTE — Clinical Social Work Placement (Signed)
   CLINICAL SOCIAL WORK PLACEMENT  NOTE  Date:  02/18/2015  Patient Details  Name: Dustin [name] MRN: [id number] Date of Birth: 11/06/1926  Clinical Social Work is seeking post-discharge placement for this patient at the [place] level of care (*CSW will initial, date and re-position this form in  chart as items are completed):  Yes   Patient/family provided with [facility] Work Department's list of facilities offering this level of care within the geographic area requested by the patient (or if unable, by the patient's family).  Yes   Patient/family informed of their freedom to choose among providers that offer the needed level of care, that participate in Medicare, Medicaid or managed care program needed by the patient, have an available bed and are willing to accept the patient.  Yes   Patient/family informed of [facility]'s ownership interest in [facility] and [facility], as well as of the fact that they are under no obligation to receive care at these facilities.  PASRR submitted to EDS on 02/16/15     PASRR number received on 02/16/15     Existing PASRR number confirmed on       FL2 transmitted to all facilities in geographic area requested by pt/family on 02/17/15     FL2 transmitted to all facilities within larger geographic area on       Patient informed that his/her managed care company has contracts with or will negotiate with certain facilities, including the following:        Yes   Patient/family informed of bed offers received.  Patient chooses bed at [facility]     Physician recommends and patient chooses bed at      Patient to be transferred to [facility] on 02/18/15.  Patient to be transferred to facility by [facility]     Patient family notified on 02/18/15 of transfer.  Name of family member notified:  Angela Nevin (daughter)     PHYSICIAN Please prepare prescriptions, Please prepare  priority discharge summary, including medications     Additional Comment:    _______________________________________________ Berton Mount, [place]

## 2015-02-18 NOTE — Discharge Summary (Signed)
Physician Discharge Summary  Patient ID: Dustin [name] MRN: [id number] DOB/AGE: Apr 11, 1927 81 y.o.  Admit date: 02/14/2015 Discharge date: 02/18/2015  Discharge Diagnoses Patient Active Problem List   Diagnosis Date Noted  . Dementia   . Fall 02/15/2015  . Multiple fractures of ribs of left side 02/15/2015  . Traumatic pneumothorax 02/14/2015    Consultants None   Procedures 5/9 -- Left tube thoracostomy by Dr. Judeth Horn   HPI: Luc fell at home 2 weeks prior to admission. He had been having trouble moving around since and it hurt to breathe or cough.Films on admit showed fractures of the 7th, 8th and 9th ribs with a 35% pneumothorax. He had some dementia but did not acknowledge it. His memory was poor.He had multiple medical issues including atrial fibrillation and was on Pradaxa.He was admitted and transferred to [facility] for further care on the trauma service.   Hospital Course: On arrival at [facility] the decision was made to place a chest tube. This was performed with good expansion of the lung. It was able to be weaned to water seal and removed without difficulty. He developed some acute delirium with combativeness. A workup for organic causes was negative. Although he had been on tramadol for 5 days prior to admission it was thought this might be the cause. After that and all narcotics had been stopped for a couple of days he improved. He was mobilized with physical and occupational therapies who recommended skilled nursing facility placement. He had an incidental finding of a pancreatic head mass with a history of a previous pancreatic head cyst. He will need a MRI of his abdomen and then either a MRCP or endoscopic ultrasound. We will leave this workup to primary care should the patient and family wish to pursue a diagnosis. He was discharged to the facility in good condition.     Medication List    STOP taking these medications        oxyCODONE-acetaminophen  5-325 MG per tablet  Commonly known as:  PERCOCET/ROXICET     traMADol 50 MG tablet  Commonly known as:  ULTRAM      TAKE these medications        acetaminophen 325 MG tablet  Commonly known as:  TYLENOL  Take 2 tablets (650 mg total) by mouth every 6 (six) hours as needed (pain).     bimatoprost 0.01 % Soln  Commonly known as:  LUMIGAN  Place 1 drop into both eyes at bedtime.     brimonidine 0.1 % Soln  Commonly known as:  ALPHAGAN P  Place 1 drop into both eyes 2 (two) times daily.     dabigatran 150 MG Caps capsule  Commonly known as:  PRADAXA  Take 150 mg by mouth 2 (two) times daily.     docusate sodium 100 MG capsule  Commonly known as:  COLACE  Take 200 mg by mouth daily.     lovastatin 40 MG tablet  Commonly known as:  MEVACOR  Take 40 mg by mouth at bedtime.     metFORMIN 500 MG tablet  Commonly known as:  GLUCOPHAGE  Take 500 mg by mouth daily.     naproxen 500 MG tablet  Commonly known as:  NAPROSYN  Take 1 tablet (500 mg total) by mouth 2 (two) times daily with a meal.     omeprazole 20 MG capsule  Commonly known as:  PRILOSEC  Take 20 mg by mouth daily as needed (heartburn, acid reflux).  traZODone 50 MG tablet  Commonly known as:  DESYREL  Take 1 tablet (50 mg total) by mouth at bedtime as needed for sleep.     valsartan-hydrochlorothiazide 80-12.5 MG per tablet  Commonly known as:  DIOVAN-HCT  Take 1 tablet by mouth daily.     vismodegib 150 MG capsule  Commonly known as:  ERIVEDGE  Take 150 mg by mouth daily.            Follow-up Information    Call [facility].   Why:  As needed   Contact information:   [place] [id number] [phone]      Schedule an appointment as soon as possible for a visit with Osborne Casco, MD.   Specialty:  Family Medicine   Why:  pancreatic mass   Contact information:   [address] [place]  [id number] [phone]        Signed: Lisette Abu, PA-C Pager: [phone] General Trauma PA Pager: [phone] 02/18/2015, 10:14 AM

## 2015-02-18 NOTE — Progress Notes (Signed)
CSW (Clinical Education officer, museum) spoke with pt daughter Angela Nevin and provided with bed offers. She has accept bed at [facility] awaiting confirmation from facility. If facility is able to accept, family available to do paperwork at 3:30pm and pt able to dc at this time as well.    [place], [place]

## 2015-02-18 NOTE — Progress Notes (Signed)
Report caled to Lorenso Courier, RN ( [facility]).

## 2015-02-21 ENCOUNTER — Telehealth (HOSPITAL_COMMUNITY): Payer: Self-pay

## 2015-02-21 NOTE — Telephone Encounter (Signed)
Informed SNF that he needed f/u appt with PCP but only needed to f/u with trauma prn.

## 2015-11-24 ENCOUNTER — Other Ambulatory Visit: Payer: Self-pay | Admitting: Urology

## 2015-11-24 DIAGNOSIS — C61 Malignant neoplasm of prostate: Secondary | ICD-10-CM

## 2015-12-26 ENCOUNTER — Encounter (HOSPITAL_COMMUNITY)
Admission: RE | Admit: 2015-12-26 | Discharge: 2015-12-26 | Disposition: A | Discharge status: Home / Self Care | Payer: Medicare PPO | Source: Ambulatory Visit | Attending: Urology | Admitting: Urology

## 2015-12-26 DIAGNOSIS — C61 Malignant neoplasm of prostate: Secondary | ICD-10-CM | POA: Insufficient documentation

## 2015-12-26 MED ORDER — TECHNETIUM TC 99M MEDRONATE IV KIT
25.0000 | PACK | Freq: Once | INTRAVENOUS | Status: AC | PRN
  Administered 2015-12-26: 26.4 via INTRAVENOUS

## 2016-01-02 ENCOUNTER — Telehealth: Payer: Self-pay | Admitting: Gastroenterology

## 2016-01-02 NOTE — Telephone Encounter (Signed)
I spoke with Dustin [name] she will fax records for review.

## 2016-01-02 NOTE — Telephone Encounter (Signed)
Records reviewed.  Abnormal pancreas on CT dilated ducts and mass.  Patient will come in and see Nicoletta Ba PA on 01/05/16 11:00.  Bethena Roys notified

## 2016-01-05 ENCOUNTER — Ambulatory Visit (INDEPENDENT_AMBULATORY_CARE_PROVIDER_SITE_OTHER): Payer: Medicare PPO | Admitting: Physician Assistant

## 2016-01-05 ENCOUNTER — Encounter: Payer: Self-pay | Admitting: Physician Assistant

## 2016-01-05 ENCOUNTER — Other Ambulatory Visit (INDEPENDENT_AMBULATORY_CARE_PROVIDER_SITE_OTHER): Payer: Medicare PPO

## 2016-01-05 VITALS — BP 152/78 | HR 56 | Ht 71.0 in | Wt 189.0 lb

## 2016-01-05 DIAGNOSIS — R932 Abnormal findings on diagnostic imaging of liver and biliary tract: Secondary | ICD-10-CM

## 2016-01-05 DIAGNOSIS — K869 Disease of pancreas, unspecified: Secondary | ICD-10-CM

## 2016-01-05 LAB — BASIC METABOLIC PANEL
BUN: 21 mg/dL (ref 6–23)
CHLORIDE: 103 meq/L (ref 96–112)
CO2: 33 meq/L — AB (ref 19–32)
Calcium: 9.6 mg/dL (ref 8.4–10.5)
Creatinine, Ser: 1.12 mg/dL (ref 0.40–1.50)
GFR: 66 mL/min (ref >60.00)
GLUCOSE: 97 mg/dL (ref 70–99)
POTASSIUM: 4.1 meq/L (ref 3.5–5.1)
SODIUM: 141 meq/L (ref 135–145)

## 2016-01-05 NOTE — Progress Notes (Signed)
Patient ID: Dustin [name], male   DOB: 1927/08/18, 82 y.o.   MRN: [id number]   Subjective:    Patient ID: Dustin [name], male    DOB: [birth year], 82 y.o.   MRN: [id number]  HPI  Dustin [name] is a pleasant 82 year old white male, new to GI today referred by Dr. Dahlstedt/[facility] for evaluation of abnormal pancreas on recent CT imaging which was done for follow-up of prostate cancer. Patient's other medical problems included onset diabetes mellitus, prostate cancer which was diagnosed in 2008, prior CVA for which she is maintained on Pradaxa, mild dementia and depression. Patient's daughter relates that her parents moved back to [place] from [place] about a year ago. He had CT on 12/26/2015 with IV contrast that showed him to be status post cholecystectomy common bile duct of 1.1 cm there is a cystic lesion or dilated dorsal pancreatic duct measuring up to 1.6 cm in the pancreatic head was atrophy of the pancreatic tail this was noted to be increased in size over prior CT done in May 2016. Concerning for pancreatic malignancy and biopsy versus MRI recommended. Also noted a 3.1 cm infrarenal abdominal aortic aneurysm and L5-S1 foraminal stenosis. Patient has absolutely no GI symptoms. He denies any problems with abdominal pain or discomfort. No nausea or problems with his bowels. His appetite is been good and his weight has been stable. Patient does have history of pancreatitis twice in the remote past according to his wife around the time he had undergone cholecystectomy in the 1990s. Daughter also brought reports from prior workup of a pancreatic abnormality. He had been seen in [place] by a gastroenterologist in 2008 and had EUS done by a Dr. Posey Pronto in [place]. This showed a dilated pancreatic duct in the pancreatic body is ring 6.2 mm and a hypoechoic mass in the head of the pancreas with dilated pancreatic duct measuring 15 x 23 mm this was aspirated. He was recommended to have PET  scan. We do not have results of the path from FNA. Patient's wife states that patient decided not to have any further workup at that time. He was also seen once by Dr. Jerene Pitch at [facility] in 2008 regarding the abnormal pancreas. Apparently Dr. Jerene Pitch did not recommend any further workup and felt this process was benign and patient did not follow up there again.  Review of Systems Pertinent positive and negative review of systems were noted in the above HPI section.  All other review of systems was otherwise negative.  Outpatient Encounter Prescriptions as of 01/05/2016  Medication Sig  . acetaminophen (TYLENOL) 325 MG tablet Take 2 tablets (650 mg total) by mouth every 6 (six) hours as needed (pain).  . brimonidine (ALPHAGAN P) 0.1 % SOLN Place 1 drop into both eyes 2 (two) times daily.   . dabigatran (PRADAXA) 150 MG CAPS capsule Take 150 mg by mouth 2 (two) times daily.  [name] lovastatin (MEVACOR) 40 MG tablet Take 40 mg by mouth at bedtime.  . metFORMIN (GLUCOPHAGE) 500 MG tablet Take 500 mg by mouth daily.  [name] omeprazole (PRILOSEC) 20 MG capsule Take 20 mg by mouth daily as needed (heartburn, acid reflux).  . valsartan-hydrochlorothiazide (DIOVAN-HCT) 80-12.5 MG per tablet Take 1 tablet by mouth daily.  [name] docusate sodium (COLACE) 100 MG capsule Take 200 mg by mouth daily. Reported on 01/05/2016  . [DISCONTINUED] bimatoprost (LUMIGAN) 0.01 % SOLN Place 1 drop into both eyes at bedtime.  . [DISCONTINUED] naproxen (NAPROSYN) 500 MG tablet Take 1 tablet (500  mg total) by mouth 2 (two) times daily with a meal. (Patient not taking: Reported on 01/05/2016)  . [DISCONTINUED] traZODone (DESYREL) 50 MG tablet Take 1 tablet (50 mg total) by mouth at bedtime as needed for sleep.  . [DISCONTINUED] vismodegib (ERIVEDGE) 150 MG capsule Take 150 mg by mouth daily.   No facility-administered encounter medications on file as of 01/05/2016.   No Known Allergies Patient Active Problem List   Diagnosis Date  Noted  . Dementia   . Fall 02/15/2015  . Multiple fractures of ribs of left side 02/15/2015  . Traumatic pneumothorax 02/14/2015   Social History   Social History  . Marital Status: Married    Spouse Name: N/A  . Number of Children: N/A  . Years of Education: N/A   Occupational History  . Not on file.   Social History Main Topics  . Smoking status: Former Research scientist (life sciences)  . Smokeless tobacco: Never Used  . Alcohol Use: No     Comment: "a beer once in a while"  . Drug Use: No  . Sexual Activity: Not on file   Other Topics Concern  . Not on file   Social History Narrative    Mr. Poulton's family history includes Colon cancer in his father; Diabetes Mellitus II in his sister; Stroke in his father. There is no history of Heart attack or Prostate cancer.      Objective:    Filed Vitals:   01/05/16 1107  BP: 152/78  Pulse: 56    Physical Exam  well-developed elderly white male in no acute distress accompanied by his wife and daughter blood pressure 152/78 pulse 56 height 5 foot 11 weight 189. HEENT; nontraumatic normocephalic EOMI PERRLA sclera anicteric, Cardiovascular; regular rate and rhythm with S1-S2 no murmur or gallop, Pulmonary; clear bilaterally, Abdomen; soft nontender nondistended bowel sounds are active there is no palpable mass or hepatosplenomegaly present, Neuropsych; mood and affect appropriate     Assessment & Plan:   #1 82 yo male asymptomatic with Abnormal CT imaging of the pancreas with a cystic lesion measuring 1.6 x 2.7 cm in the head of the pancreas increased in prominence over the past 10 months and associated with dorsal duct dilation and atrophy of the pancreatic tail. Rule out pancreatic neoplasm. Patient has prior history of pancreatic head cystic lesion associated with ductal dilation dating back to 2008 at which time he underwent EUS with FNA in [place]. Given duration of presence of this lesion likely benign though has increased in size.  #2  history of CVA #3 chronic anticoagulation-on Pradaxa #4 dementia #5 adult-onset diabetes mellitus #6 prostate CA  Plan; patient is not interested in pursuing any invasive workup as he states he feels fine. His daughter would like him to pursue further noninvasive evaluation to try to determine if this is a malignancy. We'll schedule for MRI of the pancreas/pancreatic protocol Check CEA ,CA-19-9, Patient will be established with Dr. Havery Moros. Further plans pending results of above.    Hakan Nudelman S Breeann Reposa PA-C 01/05/2016   Cc: Kelton Pillar, MD

## 2016-01-05 NOTE — Progress Notes (Signed)
Agree with assessment and plan. Discussed with PA [place]. The patient and family did not wish to be aggressive about workup for this lesion at present time, he has dementia and on anticoagulant. EUS would be the most definitive way to evaluate this and biopsy if needed. Will start with MRI/MRCP to further evaluate, as radiology had recommended more definitive imaging. If this is malignancy, unclear if they would want any treatment for it. Will await MR, consider EUS pending on how aggressive patient and family wish to be with this workup.

## 2016-01-05 NOTE — Patient Instructions (Addendum)
Please go to the basement level to have your labs drawn.   You have been scheduled for an MRI at 01-13-2016 on Friday Your appointment time is 3:00 PM. Please arrive at 2:45 PM  to your appointment time for registration purposes. You may have breakfast and have your lunch at 12:00 Noon.  Then nothing by mouth until the test.  This test typically takes 45 minutes to 1 hour to complete.  Location of test is [place] beside [facility].

## 2016-01-06 LAB — CANCER ANTIGEN 19-9: CA 19-9: 19 U/mL (ref <34)

## 2016-01-06 LAB — CEA: CEA: 1.9 ng/mL

## 2016-01-13 ENCOUNTER — Ambulatory Visit (HOSPITAL_COMMUNITY)
Admission: RE | Admit: 2016-01-13 | Discharge: 2016-01-13 | Disposition: A | Discharge status: Home / Self Care | Payer: Medicare PPO | Source: Ambulatory Visit | Attending: Physician Assistant | Admitting: Physician Assistant

## 2016-01-13 DIAGNOSIS — R932 Abnormal findings on diagnostic imaging of liver and biliary tract: Secondary | ICD-10-CM

## 2016-01-13 DIAGNOSIS — K869 Disease of pancreas, unspecified: Secondary | ICD-10-CM | POA: Insufficient documentation

## 2016-01-13 MED ORDER — GADOBENATE DIMEGLUMINE 529 MG/ML IV SOLN
20.0000 mL | Freq: Once | INTRAVENOUS | Status: AC | PRN
  Administered 2016-01-13: 17 mL via INTRAVENOUS

## 2016-01-16 ENCOUNTER — Telehealth: Payer: Self-pay | Admitting: Physician Assistant

## 2016-01-16 NOTE — Telephone Encounter (Signed)
Daughter has seen the report on MyChart. She is aware of the recommendations and the implications. She and her father are discussing it.

## 2016-01-25 ENCOUNTER — Encounter: Payer: Self-pay | Admitting: Physician Assistant

## 2016-01-25 ENCOUNTER — Ambulatory Visit: Payer: Medicare PPO | Admitting: Physician Assistant

## 2016-01-25 ENCOUNTER — Ambulatory Visit (INDEPENDENT_AMBULATORY_CARE_PROVIDER_SITE_OTHER): Payer: Medicare PPO | Admitting: Physician Assistant

## 2016-01-25 VITALS — BP 142/76 | HR 80 | Ht 70.0 in | Wt 186.1 lb

## 2016-01-25 DIAGNOSIS — K8689 Other specified diseases of pancreas: Secondary | ICD-10-CM

## 2016-01-25 DIAGNOSIS — K869 Disease of pancreas, unspecified: Secondary | ICD-10-CM | POA: Diagnosis not present

## 2016-01-25 NOTE — Patient Instructions (Signed)
Follow up with [place] in 1 year.  ( 01/2017).

## 2016-01-25 NOTE — Progress Notes (Signed)
Agree with assessment and plan. Patient is 36 with multiple comorbidities on Pradaxa, he understands the likelihood of malignancy is high based on imaging, and with time is likely going to become symptomatic; however he declines further evaluation / treatment at this time given age and comorbidities as outlined. Consider repeat imaging in 6 months to assess for interval changes or should he have a change of heart in regards to how aggressive he wishes to be, he can contact us.

## 2016-01-25 NOTE — Progress Notes (Signed)
Patient ID: Dustin [name], male   DOB: 1927/03/08, 82 y.o.   MRN: [id number]   Subjective:    Patient ID: Dustin [name], male    DOB: 04/08/1927, 82 y.o.   MRN: [id number]  HPI Dustin [name] is a pleasant 82 year old white male who was initially seen in our office on 01/05/2016 referral by Dr. Alycia [name] urology for evaluation of an abnormal pancreas noted on recent CT imaging. Ct was done for follow-up of prostate cancer. Patient also has adult-onset diabetes mellitus, prior CVA for which she is maintained on Pradaxa, mild dementia and depression. CT done 12/26/2015 with IV contrast showed him to be status post cholecystectomy, common bile duct of 1.1 cm and a cystic lesion or dilated dorsal pancreatic duct measuring up to 1.6 cm in the pancreatic head, there was also atrophy of the pancreatic tail noted to be somewhat increased over prior study done in May 2016. Patient has been totally asymptomatic from a GI perspective. His appetite is been good his weight has been stable and he has no complaints of abdominal pain. He does have prior history of pancreatitis 2 in the 1980s. He had also undergone prior workup in 2008 by gastroenterologist in [place] for a hypoechoic mass in the head of the pancreas and a dilated pancreatic duct. Family relates that this was aspirated and that these were negative. It was suggested that he have further workup with PET scan etc. but patient did not pursue further workup. After office visit here he underwent MRI of the abdomen on 01/13/2016 this shows cystic dilation of the pancreatic duct at the January of the pancreas measuring 16 mm x 12 mm distal pancreatic duct leading to the ampulla is normal caliber. There is a lesion within the inferior ventral aspect of the pancreatic head measuring 19 mm x 20 mm was atrophy of the pancreatic tail no peripancreatic lymph nodes. Lesion concerning for neoplasm as has slightly increased in size over comparison CT differential  pancreatic neuroendocrine tumor versus pancreatic adenocarcinoma. No biliary ductal obstruction. CA-19-9 = 19, CEA 1.9. Again patient says he feels fine and has absolutely no GI complaints. Appetite has been good and weight has been stable.  Review of Systems Pertinent positive and negative review of systems were noted in the above HPI section.  All other review of systems was otherwise negative.  Outpatient Encounter Prescriptions as of 01/25/2016  Medication Sig  . acetaminophen (TYLENOL) 325 MG tablet Take 2 tablets (650 mg total) by mouth every 6 (six) hours as needed (pain).  . brimonidine (ALPHAGAN P) 0.1 % SOLN Place 1 drop into both eyes 2 (two) times daily.   . dabigatran (PRADAXA) 150 MG CAPS capsule Take 150 mg by mouth 2 (two) times daily.  [name] docusate sodium (COLACE) 100 MG capsule Take 200 mg by mouth daily. Reported on 01/05/2016  . lovastatin (MEVACOR) 40 MG tablet Take 40 mg by mouth at bedtime.  . metFORMIN (GLUCOPHAGE) 500 MG tablet Take 500 mg by mouth daily.  [name] omeprazole (PRILOSEC) 20 MG capsule Take 20 mg by mouth daily as needed (heartburn, acid reflux).  . valsartan-hydrochlorothiazide (DIOVAN-HCT) 80-12.5 MG per tablet Take 1 tablet by mouth daily.   No facility-administered encounter medications on file as of 01/25/2016.   No Known Allergies Patient Active Problem List   Diagnosis Date Noted  . Pancreatic mass 01/25/2016  . Dementia   . Fall 02/15/2015  . Multiple fractures of ribs of left side 02/15/2015  . Traumatic pneumothorax 02/14/2015   Social  History   Social History  . Marital Status: Married    Spouse Name: N/A  . Number of Children: N/A  . Years of Education: N/A   Occupational History  . Not on file.   Social History Main Topics  . Smoking status: Former Research scientist (life sciences)  . Smokeless tobacco: Never Used  . Alcohol Use: No     Comment: "a beer once in a while"  . Drug Use: No  . Sexual Activity: Not on file   Other Topics Concern  . Not on file     Social History Narrative    Dustin [name] family history includes Colon cancer in his father; Diabetes Mellitus II in his sister; Stroke in his father. There is no history of Heart attack or Prostate cancer.      Objective:    Filed Vitals:   01/25/16 0945  BP: 142/76  Pulse: 80    Physical Exam  well-developed elderly white male in no acute distress, ambulates with a walker accompanied by his daughter and wife blood pressure 142/76 pulse 80 height 5 foot 10 weight 186, not further examined today discussion only     Assessment & Plan:   #1 82 yo male With asymptomatic, approximate 2 cm pancreatic head lesion which is likely been present since at least 2008 but has very gradually increased in size. While this may be a neoplasm, has been very slow growing and therefore unlikely to be an adenocarcinoma. #2 Focal ductal dilation of the pancreatic duct rule out intraductal papillary mucinous tumor #3 dementia #4 history of CVA #5 chronic anticoagulation with Pradaxa #6 prior history of pancreatitis 2 and prior cholecystectomy 1980s  Plan; long discussion with patient and his family again today regarding MRI findings. Patient does have dementia and does not have a clear understanding of MRI, or implications. I explained carefully that he does have a tumor in his pancreas and that this may be cancerous, however is slow growing. We reviewed for need for EUS with biopsy to have a definite answer and neither he nor his daughter are interested in pursuing this. I explained that eventually he may become symptomatic and/or develop obstructive symptoms. For now they are comfortable with a noninvasive, wait and see approach as he is totally asymptomatic, of advanced age and with several comorbidities. We will plan follow-up on an as-needed basis, and consider repeat imaging in 6-12 months.  Dustin [name] Dustin Harold PA-C 01/25/2016   Cc: Dustin Pillar, MD

## 2016-03-08 ENCOUNTER — Emergency Department (HOSPITAL_COMMUNITY): Payer: Medicare PPO

## 2016-03-08 ENCOUNTER — Inpatient Hospital Stay (HOSPITAL_COMMUNITY)
Admission: EM | Admit: 2016-03-08 | Discharge: 2016-03-13 | DRG: 065 | Disposition: A | Discharge status: Skilled Nursing Facility | Payer: Medicare PPO | Attending: Internal Medicine | Admitting: Internal Medicine

## 2016-03-08 ENCOUNTER — Encounter (HOSPITAL_COMMUNITY): Payer: Self-pay | Admitting: Emergency Medicine

## 2016-03-08 DIAGNOSIS — R29898 Other symptoms and signs involving the musculoskeletal system: Secondary | ICD-10-CM

## 2016-03-08 DIAGNOSIS — Z87891 Personal history of nicotine dependence: Secondary | ICD-10-CM

## 2016-03-08 DIAGNOSIS — I482 Chronic atrial fibrillation, unspecified: Secondary | ICD-10-CM

## 2016-03-08 DIAGNOSIS — I639 Cerebral infarction, unspecified: Secondary | ICD-10-CM | POA: Diagnosis not present

## 2016-03-08 DIAGNOSIS — I480 Paroxysmal atrial fibrillation: Secondary | ICD-10-CM | POA: Insufficient documentation

## 2016-03-08 DIAGNOSIS — R29810 Facial weakness: Secondary | ICD-10-CM | POA: Diagnosis present

## 2016-03-08 DIAGNOSIS — Z79899 Other long term (current) drug therapy: Secondary | ICD-10-CM

## 2016-03-08 DIAGNOSIS — I10 Essential (primary) hypertension: Secondary | ICD-10-CM

## 2016-03-08 DIAGNOSIS — Z66 Do not resuscitate: Secondary | ICD-10-CM

## 2016-03-08 DIAGNOSIS — R296 Repeated falls: Secondary | ICD-10-CM

## 2016-03-08 DIAGNOSIS — F0391 Unspecified dementia with behavioral disturbance: Secondary | ICD-10-CM | POA: Diagnosis present

## 2016-03-08 DIAGNOSIS — F329 Major depressive disorder, single episode, unspecified: Secondary | ICD-10-CM | POA: Diagnosis present

## 2016-03-08 DIAGNOSIS — F0390 Unspecified dementia without behavioral disturbance: Secondary | ICD-10-CM | POA: Diagnosis present

## 2016-03-08 DIAGNOSIS — D649 Anemia, unspecified: Secondary | ICD-10-CM | POA: Insufficient documentation

## 2016-03-08 DIAGNOSIS — R531 Weakness: Secondary | ICD-10-CM | POA: Diagnosis not present

## 2016-03-08 DIAGNOSIS — E119 Type 2 diabetes mellitus without complications: Secondary | ICD-10-CM

## 2016-03-08 DIAGNOSIS — W19XXXA Unspecified fall, initial encounter: Secondary | ICD-10-CM

## 2016-03-08 DIAGNOSIS — Z8673 Personal history of transient ischemic attack (TIA), and cerebral infarction without residual deficits: Secondary | ICD-10-CM | POA: Insufficient documentation

## 2016-03-08 DIAGNOSIS — H409 Unspecified glaucoma: Secondary | ICD-10-CM | POA: Diagnosis present

## 2016-03-08 DIAGNOSIS — R5381 Other malaise: Secondary | ICD-10-CM

## 2016-03-08 DIAGNOSIS — Z8546 Personal history of malignant neoplasm of prostate: Secondary | ICD-10-CM

## 2016-03-08 DIAGNOSIS — R410 Disorientation, unspecified: Secondary | ICD-10-CM | POA: Diagnosis present

## 2016-03-08 DIAGNOSIS — G8191 Hemiplegia, unspecified affecting right dominant side: Secondary | ICD-10-CM | POA: Diagnosis present

## 2016-03-08 DIAGNOSIS — F419 Anxiety disorder, unspecified: Secondary | ICD-10-CM | POA: Diagnosis present

## 2016-03-08 DIAGNOSIS — Z7984 Long term (current) use of oral hypoglycemic drugs: Secondary | ICD-10-CM

## 2016-03-08 DIAGNOSIS — E785 Hyperlipidemia, unspecified: Secondary | ICD-10-CM | POA: Diagnosis present

## 2016-03-08 DIAGNOSIS — F03918 Unspecified dementia, unspecified severity, with other behavioral disturbance: Secondary | ICD-10-CM | POA: Diagnosis present

## 2016-03-08 DIAGNOSIS — I6523 Occlusion and stenosis of bilateral carotid arteries: Secondary | ICD-10-CM | POA: Diagnosis present

## 2016-03-08 DIAGNOSIS — M1990 Unspecified osteoarthritis, unspecified site: Secondary | ICD-10-CM | POA: Diagnosis present

## 2016-03-08 DIAGNOSIS — Z85828 Personal history of other malignant neoplasm of skin: Secondary | ICD-10-CM

## 2016-03-08 LAB — CBC WITH DIFFERENTIAL/PLATELET
Basophils Absolute: 0 10*3/uL (ref 0.0–0.1)
Basophils Relative: 0 %
Eosinophils Absolute: 0 10*3/uL (ref 0.0–0.7)
Eosinophils Relative: 0 %
HCT: 36.7 % — ABNORMAL LOW (ref 39.0–52.0)
HEMOGLOBIN: 12.7 g/dL — AB (ref 13.0–17.0)
Lymphocytes Relative: 22 %
Lymphs Abs: 1.9 10*3/uL (ref 0.7–4.0)
MCH: 32.9 pg (ref 26.0–34.0)
MCHC: 34.6 g/dL (ref 30.0–36.0)
MCV: 95.1 fL (ref 78.0–100.0)
Monocytes Absolute: 0.6 10*3/uL (ref 0.1–1.0)
Monocytes Relative: 7 %
NEUTROS ABS: 6.3 10*3/uL (ref 1.7–7.7)
Neutrophils Relative %: 71 %
Platelets: 187 10*3/uL (ref 150–400)
RBC: 3.86 MIL/uL — AB (ref 4.22–5.81)
RDW: 13.2 % (ref 11.5–15.5)
WBC: 8.8 10*3/uL (ref 4.0–10.5)

## 2016-03-08 LAB — COMPREHENSIVE METABOLIC PANEL
ALBUMIN: 4.6 g/dL (ref 3.5–5.0)
ALK PHOS: 51 U/L (ref 38–126)
ALT: 29 U/L (ref 17–63)
AST: 37 U/L (ref 15–41)
Anion gap: 9 (ref 5–15)
BILIRUBIN TOTAL: 4.1 mg/dL — AB (ref 0.3–1.2)
BUN: 31 mg/dL — AB (ref 6–20)
CALCIUM: 9.6 mg/dL (ref 8.9–10.3)
CO2: 28 mmol/L (ref 22–32)
Chloride: 101 mmol/L (ref 101–111)
Creatinine, Ser: 1.09 mg/dL (ref 0.61–1.24)
GFR calc Af Amer: >60 mL/min (ref >60)
GFR calc non Af Amer: 59 mL/min — ABNORMAL LOW (ref >60)
GLUCOSE: 139 mg/dL — AB (ref 65–99)
POTASSIUM: 3.8 mmol/L (ref 3.5–5.1)
Sodium: 138 mmol/L (ref 135–145)
Total Protein: 7.1 g/dL (ref 6.5–8.1)

## 2016-03-08 LAB — I-STAT CHEM 8, ED
BUN: 33 mg/dL — ABNORMAL HIGH (ref 6–20)
CALCIUM ION: 1.16 mmol/L (ref 1.13–1.30)
Chloride: 99 mmol/L — ABNORMAL LOW (ref 101–111)
Creatinine, Ser: 1.2 mg/dL (ref 0.61–1.24)
Glucose, Bld: 136 mg/dL — ABNORMAL HIGH (ref 65–99)
HEMATOCRIT: 38 % — AB (ref 39.0–52.0)
HEMOGLOBIN: 12.9 g/dL — AB (ref 13.0–17.0)
Potassium: 3.8 mmol/L (ref 3.5–5.1)
SODIUM: 140 mmol/L (ref 135–145)
TCO2: 29 mmol/L (ref 0–100)

## 2016-03-08 LAB — URINALYSIS, ROUTINE W REFLEX MICROSCOPIC
BILIRUBIN URINE: NEGATIVE
Glucose, UA: NEGATIVE mg/dL
Hgb urine dipstick: NEGATIVE
Ketones, ur: NEGATIVE mg/dL
LEUKOCYTES UA: NEGATIVE
NITRITE: NEGATIVE
PROTEIN: NEGATIVE mg/dL
Specific Gravity, Urine: 1.024 (ref 1.005–1.030)
pH: 5 (ref 5.0–8.0)

## 2016-03-08 NOTE — ED Notes (Signed)
Dr Zenia Resides at bedside talking with family; attempted to get pt OOB; pt stood at bedside but required a lot of assistance; pt unable to stand and unable to take a step; pt grabbing at RN and table screaming "I am going to fall"; pt assisted back to bed; Dr Zenia Resides cancelled DC and is going to do further testing

## 2016-03-08 NOTE — ED Notes (Signed)
Patient unable to use straw at this time due to dental procedure yesterday, 7 teeth were pulled.

## 2016-03-08 NOTE — ED Notes (Signed)
Per EMS, pt from home, here for multiple falls and sliding out of chair. Dementia at baseline. No head injury. Pt on Pradaxa. Ecchymosis to top of right leg.

## 2016-03-08 NOTE — ED Notes (Signed)
Patient transported to MRI 

## 2016-03-08 NOTE — ED Notes (Signed)
Patient reapplied to cardiac monitor, denies pain at this time.

## 2016-03-08 NOTE — Progress Notes (Signed)
CSW met with pt at bedside. Patient was alert and oriented. There was no family present. Patient confirms that he presents to [facility] due to sliding out of chair. Also, pt states that he is from home. Patient states that he lives at home with his wife and daughter. Patient states that he does not assistance with completing his ADL's. Patient states that he has fallen x4 within the past 6 months.  Patient states that he is not interested in facility information. Patient states upon discharge he would feel safe to return home.   Patient states he has no questions for CSW at this time.  Willette Brace [phone] ED CSW 03/08/2016 8:30 PM

## 2016-03-08 NOTE — ED Provider Notes (Addendum)
CSN: [id number]     Arrival date & time 03/08/16  1756 History   First MD Initiated Contact with Patient 03/08/16 1812     Chief Complaint  Patient presents with  . Fall     (Consider location/radiation/quality/duration/timing/severity/associated sxs/prior Treatment) HPI Comments: Patient here due to having multiple falls prior to arrival. Patient is been slightly out of his chair. Does have a history of baseline dementia. He is on Pradaxa currently. Patient denies any headache or neck pain at this time. No recent urinary symptoms. No further history obtainable due to his dementia. Patient transported by EMS.  Patient is a 82 y.o. male presenting with fall. The history is provided by the patient and the EMS personnel. The history is limited by the condition of the patient.  Fall    Past Medical History  Diagnosis Date  . Pancreatitis   . A-fib ([place])   . Cancer ([place]) 2012    skin cancers  . Prostate cancer ([place])   . Dementia   . Stroke ([place])   . Hypertension   . Diabetes mellitus without complication ([place])   . Hyperlipidemia   . Poor balance   . Bilateral knee pain   . Arthritis    Past Surgical History  Procedure Laterality Date  . Eye surgery    . Appendectomy    . Cholecystectomy    . Pancreas surgery     Family History  Problem Relation Age of Onset  . Heart attack Neg Hx   . Stroke Father   . Diabetes Mellitus II Sister   . Prostate cancer Neg Hx   . Colon cancer Father    Social History  Substance Use Topics  . Smoking status: Former Research scientist (life sciences)  . Smokeless tobacco: Never Used  . Alcohol Use: No     Comment: "a beer once in a while"    Review of Systems  Unable to perform ROS: Acuity of condition      Allergies  Review of patient's allergies indicates no known allergies.  Home Medications   Prior to Admission medications   Medication Sig Start Date End Date Taking? Authorizing Provider  acetaminophen (TYLENOL) 325 MG tablet Take 2 tablets (650 mg  total) by mouth every 6 (six) hours as needed (pain). 02/18/15   Lisette Abu, PA-C  brimonidine (ALPHAGAN P) 0.1 % SOLN Place 1 drop into both eyes 2 (two) times daily.     Historical Provider, MD  dabigatran (PRADAXA) 150 MG CAPS capsule Take 150 mg by mouth 2 (two) times daily.    Historical Provider, MD  docusate sodium (COLACE) 100 MG capsule Take 200 mg by mouth daily. Reported on 01/05/2016    Historical Provider, MD  lovastatin (MEVACOR) 40 MG tablet Take 40 mg by mouth at bedtime.    Historical Provider, MD  metFORMIN (GLUCOPHAGE) 500 MG tablet Take 500 mg by mouth daily.    Historical Provider, MD  omeprazole (PRILOSEC) 20 MG capsule Take 20 mg by mouth daily as needed (heartburn, acid reflux).    Historical Provider, MD  valsartan-hydrochlorothiazide (DIOVAN-HCT) 80-12.5 MG per tablet Take 1 tablet by mouth daily.    Historical Provider, MD   BP 138/84 mmHg  Pulse 78  Temp(Src) 98.4 F (36.9 C) (Oral)  Resp 17  SpO2 98% Physical Exam  Constitutional: He appears well-developed and well-nourished.  Non-toxic appearance. No distress.  HENT:  Head: Normocephalic and atraumatic.  Eyes: Conjunctivae, EOM and lids are normal. Pupils are equal, round, and reactive  to light.  Neck: Normal range of motion. Neck supple. No tracheal deviation present. No thyroid mass present.  Cardiovascular: Normal rate, regular rhythm and normal heart sounds.  Exam reveals no gallop.   No murmur heard. Pulmonary/Chest: Effort normal and breath sounds normal. No stridor. No respiratory distress. He has no decreased breath sounds. He has no wheezes. He has no rhonchi. He has no rales.  Abdominal: Soft. Normal appearance and bowel sounds are normal. He exhibits no distension. There is no tenderness. There is no rebound and no CVA tenderness.  Musculoskeletal: Normal range of motion. He exhibits no edema or tenderness.  Neurological: He is alert. He has normal strength. He displays no atrophy and no tremor.  No cranial nerve deficit or sensory deficit. He exhibits normal muscle tone. GCS eye subscore is 4. GCS verbal subscore is 5. GCS motor subscore is 6.  Skin: Skin is warm and dry. No abrasion and no rash noted.     Psychiatric: His affect is blunt. His speech is tangential. He is slowed.  Nursing note and vitals reviewed.   ED Course  Procedures (including critical care time) Labs Review Labs Reviewed  URINE CULTURE  URINALYSIS, ROUTINE W REFLEX MICROSCOPIC (NOT AT [facility])  I-STAT CHEM 8, ED    Imaging Review No results found. I have personally reviewed and evaluated these images and lab results as part of my medical decision-making.   EKG Interpretation None      MDM   Final diagnoses:  None   Spoke with patient's family and states that he is only had trouble walking for the past few days. Patient's MRI shows an acute stroke. Discussed with Dr. Nicole Kindred who requested patient be transferred to [place] to the hospitalist service.    Lacretia Leigh, MD 03/08/16 Glorianne Manchester  Lacretia Leigh, MD 03/08/16 [phone]

## 2016-03-09 ENCOUNTER — Other Ambulatory Visit (HOSPITAL_COMMUNITY): Payer: Medicare PPO

## 2016-03-09 ENCOUNTER — Inpatient Hospital Stay (HOSPITAL_COMMUNITY): Payer: Medicare PPO

## 2016-03-09 ENCOUNTER — Encounter (HOSPITAL_COMMUNITY): Payer: Self-pay | Admitting: *Deleted

## 2016-03-09 DIAGNOSIS — H409 Unspecified glaucoma: Secondary | ICD-10-CM | POA: Diagnosis present

## 2016-03-09 DIAGNOSIS — E785 Hyperlipidemia, unspecified: Secondary | ICD-10-CM | POA: Diagnosis present

## 2016-03-09 DIAGNOSIS — R410 Disorientation, unspecified: Secondary | ICD-10-CM | POA: Diagnosis present

## 2016-03-09 DIAGNOSIS — W19XXXA Unspecified fall, initial encounter: Secondary | ICD-10-CM | POA: Diagnosis not present

## 2016-03-09 DIAGNOSIS — W19XXXD Unspecified fall, subsequent encounter: Secondary | ICD-10-CM | POA: Diagnosis not present

## 2016-03-09 DIAGNOSIS — Z7984 Long term (current) use of oral hypoglycemic drugs: Secondary | ICD-10-CM | POA: Diagnosis not present

## 2016-03-09 DIAGNOSIS — R531 Weakness: Secondary | ICD-10-CM | POA: Diagnosis present

## 2016-03-09 DIAGNOSIS — I6523 Occlusion and stenosis of bilateral carotid arteries: Secondary | ICD-10-CM | POA: Diagnosis present

## 2016-03-09 DIAGNOSIS — I639 Cerebral infarction, unspecified: Secondary | ICD-10-CM

## 2016-03-09 DIAGNOSIS — R29898 Other symptoms and signs involving the musculoskeletal system: Secondary | ICD-10-CM

## 2016-03-09 DIAGNOSIS — G8191 Hemiplegia, unspecified affecting right dominant side: Secondary | ICD-10-CM | POA: Diagnosis present

## 2016-03-09 DIAGNOSIS — D649 Anemia, unspecified: Secondary | ICD-10-CM | POA: Diagnosis present

## 2016-03-09 DIAGNOSIS — Z85828 Personal history of other malignant neoplasm of skin: Secondary | ICD-10-CM | POA: Diagnosis not present

## 2016-03-09 DIAGNOSIS — F0390 Unspecified dementia without behavioral disturbance: Secondary | ICD-10-CM | POA: Diagnosis present

## 2016-03-09 DIAGNOSIS — I482 Chronic atrial fibrillation, unspecified: Secondary | ICD-10-CM

## 2016-03-09 DIAGNOSIS — I10 Essential (primary) hypertension: Secondary | ICD-10-CM

## 2016-03-09 DIAGNOSIS — Z66 Do not resuscitate: Secondary | ICD-10-CM | POA: Diagnosis present

## 2016-03-09 DIAGNOSIS — Z87891 Personal history of nicotine dependence: Secondary | ICD-10-CM | POA: Diagnosis not present

## 2016-03-09 DIAGNOSIS — Z79899 Other long term (current) drug therapy: Secondary | ICD-10-CM | POA: Diagnosis not present

## 2016-03-09 DIAGNOSIS — I480 Paroxysmal atrial fibrillation: Secondary | ICD-10-CM | POA: Diagnosis not present

## 2016-03-09 DIAGNOSIS — E119 Type 2 diabetes mellitus without complications: Secondary | ICD-10-CM

## 2016-03-09 DIAGNOSIS — R29810 Facial weakness: Secondary | ICD-10-CM | POA: Diagnosis present

## 2016-03-09 DIAGNOSIS — F419 Anxiety disorder, unspecified: Secondary | ICD-10-CM | POA: Diagnosis present

## 2016-03-09 DIAGNOSIS — R296 Repeated falls: Secondary | ICD-10-CM | POA: Diagnosis present

## 2016-03-09 DIAGNOSIS — M1990 Unspecified osteoarthritis, unspecified site: Secondary | ICD-10-CM | POA: Diagnosis present

## 2016-03-09 DIAGNOSIS — I638 Other cerebral infarction: Secondary | ICD-10-CM | POA: Diagnosis not present

## 2016-03-09 DIAGNOSIS — R5381 Other malaise: Secondary | ICD-10-CM | POA: Diagnosis present

## 2016-03-09 DIAGNOSIS — Z8673 Personal history of transient ischemic attack (TIA), and cerebral infarction without residual deficits: Secondary | ICD-10-CM | POA: Diagnosis not present

## 2016-03-09 DIAGNOSIS — F329 Major depressive disorder, single episode, unspecified: Secondary | ICD-10-CM | POA: Diagnosis present

## 2016-03-09 DIAGNOSIS — I6789 Other cerebrovascular disease: Secondary | ICD-10-CM | POA: Diagnosis not present

## 2016-03-09 DIAGNOSIS — Z8546 Personal history of malignant neoplasm of prostate: Secondary | ICD-10-CM | POA: Diagnosis not present

## 2016-03-09 LAB — CBG MONITORING, ED: Glucose-Capillary: 167 mg/dL — ABNORMAL HIGH (ref 65–99)

## 2016-03-09 LAB — LIPID PANEL
CHOLESTEROL: 163 mg/dL (ref 0–200)
HDL: 57 mg/dL (ref >40)
LDL CALC: 90 mg/dL (ref 0–99)
Total CHOL/HDL Ratio: 2.9 RATIO
Triglycerides: 81 mg/dL (ref <150)
VLDL: 16 mg/dL (ref 0–40)

## 2016-03-09 LAB — GLUCOSE, CAPILLARY
GLUCOSE-CAPILLARY: 132 mg/dL — AB (ref 65–99)
GLUCOSE-CAPILLARY: 171 mg/dL — AB (ref 65–99)
Glucose-Capillary: 149 mg/dL — ABNORMAL HIGH (ref 65–99)
Glucose-Capillary: 172 mg/dL — ABNORMAL HIGH (ref 65–99)

## 2016-03-09 MED ORDER — METFORMIN HCL 500 MG PO TABS
500.0000 mg | ORAL_TABLET | Freq: Every day | ORAL | Status: DC
  Administered 2016-03-09 – 2016-03-13 (×5): 500 mg via ORAL
  Filled 2016-03-09 (×5): qty 1

## 2016-03-09 MED ORDER — IRBESARTAN 150 MG PO TABS
75.0000 mg | ORAL_TABLET | Freq: Every day | ORAL | Status: DC
  Administered 2016-03-09 – 2016-03-13 (×5): 75 mg via ORAL
  Filled 2016-03-09 (×5): qty 1

## 2016-03-09 MED ORDER — ACETAMINOPHEN 650 MG RE SUPP: 650.0000 mg | RECTAL | Status: DC | PRN

## 2016-03-09 MED ORDER — PRAVASTATIN SODIUM 40 MG PO TABS
40.0000 mg | ORAL_TABLET | Freq: Every day | ORAL | Status: DC
  Administered 2016-03-09 – 2016-03-13 (×5): 40 mg via ORAL
  Filled 2016-03-09 (×5): qty 1

## 2016-03-09 MED ORDER — BRIMONIDINE TARTRATE 0.2 % OP SOLN
1.0000 [drp] | Freq: Two times a day (BID) | OPHTHALMIC | Status: DC
  Administered 2016-03-09 – 2016-03-13 (×9): 1 [drp] via OPHTHALMIC
  Filled 2016-03-09: qty 5

## 2016-03-09 MED ORDER — VALSARTAN-HYDROCHLOROTHIAZIDE 80-12.5 MG PO TABS: 1.0000 | ORAL_TABLET | Freq: Every day | ORAL | Status: DC

## 2016-03-09 MED ORDER — ACETAMINOPHEN 325 MG PO TABS: 650.0000 mg | ORAL_TABLET | ORAL | Status: DC | PRN

## 2016-03-09 MED ORDER — HYDROCHLOROTHIAZIDE 12.5 MG PO CAPS
12.5000 mg | ORAL_CAPSULE | Freq: Every day | ORAL | Status: DC
  Administered 2016-03-09 – 2016-03-13 (×5): 12.5 mg via ORAL
  Filled 2016-03-09 (×5): qty 1

## 2016-03-09 MED ORDER — SERTRALINE HCL 50 MG PO TABS
25.0000 mg | ORAL_TABLET | Freq: Every day | ORAL | Status: DC
  Administered 2016-03-09 – 2016-03-13 (×5): 25 mg via ORAL
  Filled 2016-03-09 (×5): qty 1

## 2016-03-09 MED ORDER — STROKE: EARLY STAGES OF RECOVERY BOOK: Freq: Once | Status: DC

## 2016-03-09 MED ORDER — TEMAZEPAM 7.5 MG PO CAPS
7.5000 mg | ORAL_CAPSULE | Freq: Every evening | ORAL | Status: DC | PRN
  Administered 2016-03-10: 7.5 mg via ORAL
  Filled 2016-03-09: qty 1

## 2016-03-09 MED ORDER — INSULIN ASPART 100 UNIT/ML ~~LOC~~ SOLN
0.0000 [IU] | Freq: Three times a day (TID) | SUBCUTANEOUS | Status: DC
  Administered 2016-03-09 – 2016-03-10 (×3): 2 [IU] via SUBCUTANEOUS
  Administered 2016-03-11: 3 [IU] via SUBCUTANEOUS
  Administered 2016-03-12 – 2016-03-13 (×2): 2 [IU] via SUBCUTANEOUS

## 2016-03-09 MED ORDER — DABIGATRAN ETEXILATE MESYLATE 150 MG PO CAPS
150.0000 mg | ORAL_CAPSULE | Freq: Two times a day (BID) | ORAL | Status: DC
  Administered 2016-03-09 – 2016-03-13 (×10): 150 mg via ORAL
  Filled 2016-03-09 (×10): qty 1

## 2016-03-09 MED ORDER — SENNOSIDES-DOCUSATE SODIUM 8.6-50 MG PO TABS
1.0000 | ORAL_TABLET | Freq: Every evening | ORAL | Status: DC | PRN
  Administered 2016-03-11: 1 via ORAL
  Filled 2016-03-09: qty 1

## 2016-03-09 MED ORDER — AMOXICILLIN 500 MG PO CAPS
500.0000 mg | ORAL_CAPSULE | Freq: Four times a day (QID) | ORAL | Status: DC
Start: 2016-03-09 — End: 2016-03-13
  Administered 2016-03-09 – 2016-03-13 (×18): 500 mg via ORAL
  Filled 2016-03-09 (×16): qty 1

## 2016-03-09 MED ORDER — HALOPERIDOL LACTATE 5 MG/ML IJ SOLN
0.5000 mg | Freq: Three times a day (TID) | INTRAMUSCULAR | Status: DC | PRN
  Administered 2016-03-09: 0.5 mg via INTRAVENOUS
  Filled 2016-03-09: qty 1

## 2016-03-09 NOTE — Care Management Note (Signed)
Case Management Note  Patient Details  Name: Dustin [name] MRN: [id number] Date of Birth: [birth year]  Subjective/Objective:    Pt admitted with CVA. He is from home with his spouse.                 Action/Plan: Awaiting PT/OT recs. CM following for discharge disposition.   Expected Discharge Date:                  Expected Discharge Plan:     In-House Referral:     Discharge planning Services     Post Acute Care Choice:    Choice offered to:     DME Arranged:    DME Agency:     HH Arranged:    HH Agency:     Status of Service:  In process, will continue to follow  Medicare Important Message Given:    Date Medicare IM Given:    Medicare IM give by:    Date Additional Medicare IM Given:    Additional Medicare Important Message give by:     If discussed at [place] of Stay Meetings, dates discussed:    Additional Comments:  Pollie Friar, RN 03/09/2016, 11:37 AM

## 2016-03-09 NOTE — Progress Notes (Signed)
Patient's daughter wanted see if patient can get something for agitation. MD paged to notify.

## 2016-03-09 NOTE — Consult Note (Signed)
Admission H&P    Chief Complaint: Recurrent falls with new onset right-sided weakness.  HPI: Dustin [name] is an 82 y.o. male history of atrial fibrillation on Pradaxa, hypertension, hyperlipidemia, review stroke, diabetes mellitus and dementia, who was brought to the ED at [facility] with a history of recurrent falls and right-sided weakness. Exact onset is unclear. He was last known well on 03/07/2016. MRI showed multiple acute nonhemorrhagic infarction involving the left lentiform nucleus/corona radiata. NIH stroke score was 6.  LSN: 03/07/2016 tPA Given: No: Beyond time under for treatment consideration mRankin:  Past Medical History  Diagnosis Date  . Pancreatitis   . A-fib ([place])   . Cancer ([place]) 2012    skin cancers  . Prostate cancer ([place])   . Dementia   . Stroke ([place])   . Hypertension   . Diabetes mellitus without complication ([place])   . Hyperlipidemia   . Poor balance   . Bilateral knee pain   . Arthritis     Past Surgical History  Procedure Laterality Date  . Eye surgery    . Appendectomy    . Cholecystectomy    . Pancreas surgery      Family History  Problem Relation Age of Onset  . Heart attack Neg Hx   . Stroke Father   . Diabetes Mellitus II Sister   . Prostate cancer Neg Hx   . Colon cancer Father    Social History:  reports that he has quit smoking. He has never used smokeless tobacco. He reports that he does not drink alcohol or use illicit drugs.  Allergies: No Known Allergies  Medications Prior to Admission  Medication Sig Dispense Refill  . amoxicillin (AMOXIL) 500 MG tablet Take 500 mg by mouth 4 (four) times daily. ABT Start Date 03/07/16 & End Date 03/17/16.    . brimonidine (ALPHAGAN P) 0.1 % SOLN Place 1 drop into both eyes 2 (two) times daily.     . dabigatran (PRADAXA) 150 MG CAPS capsule Take 150 mg by mouth 2 (two) times daily.    [name] lovastatin (MEVACOR) 40 MG tablet Take 40 mg by mouth daily.     . metFORMIN (GLUCOPHAGE) 500  MG tablet Take 500 mg by mouth daily.    . sertraline (ZOLOFT) 25 MG tablet Take 1 tablet by mouth daily.  3  . valsartan-hydrochlorothiazide (DIOVAN-HCT) 80-12.5 MG per tablet Take 1 tablet by mouth daily.      ROS: Unavailable due to patient's memory difficulty with known dementia.  Physical Examination: Blood pressure 194/62, pulse 70, temperature 98.6 F (37 C), temperature source Oral, resp. rate 16, height 5' 10"  (1.778 m), weight 81.6 kg (179 lb 14.3 oz), SpO2 97 %.  HEENT-  Normocephalic, no lesions, without obvious abnormality.  Normal external eye and conjunctiva.  Normal TM's bilaterally.  Normal auditory canals and external ears. Normal external nose, mucus membranes and septum.  Normal pharynx. Neck supple with no masses, nodes, nodules or enlargement. Cardiovascular - regular rate and rhythm, S1, S2 normal, no murmur, click, rub or gallop Lungs - chest clear, no wheezing, rales, normal symmetric air entry Abdomen - soft, non-tender; bowel sounds normal; no masses,  no organomegaly Extremities - no joint deformities, effusion, or inflammation and no edema  Neurologic Examination: Mental Status: Alert, disoriented to current month, no acute distress.  Speech fluent without evidence of aphasia. Able to follow commands without difficulty. Cranial Nerves: II-Visual fields were normal. III/IV/VI-Pupils were equal and reacted only to light. Extraocular movements  were full and conjugate.    V/VII-reduced perception of tactile sensation over the right side of the face compared to the left; mild to moderate right lower facial weakness. VIII-normal. X-normal speech. XI: trapezius strength/neck flexion strength normal bilaterally XII-midline tongue extension with normal strength. Motor: Mild drift of right upper and lower extremities; motor exam otherwise unremarkable Sensory: He does perception of tactile sensation over right extremities compared to left extremities. Deep Tendon  Reflexes: 2+ and symmetric. Plantars: Flexor on the left and extensor on the right. Carotid auscultation: Normal  Results for orders placed or performed during the hospital encounter of 03/08/16 (from the past 48 hour(s))  Urinalysis, Routine w reflex microscopic (not at [facility])     Status: None   Collection Time: 03/08/16  6:31 PM  Result Value Ref Range   Color, Urine YELLOW YELLOW   APPearance CLEAR CLEAR   Specific Gravity, Urine 1.024 1.005 - 1.030   pH 5.0 5.0 - 8.0   Glucose, UA NEGATIVE NEGATIVE mg/dL   Hgb urine dipstick NEGATIVE NEGATIVE   Bilirubin Urine NEGATIVE NEGATIVE   Ketones, ur NEGATIVE NEGATIVE mg/dL   Protein, ur NEGATIVE NEGATIVE mg/dL   Nitrite NEGATIVE NEGATIVE   Leukocytes, UA NEGATIVE NEGATIVE    Comment: MICROSCOPIC NOT DONE ON URINES WITH NEGATIVE PROTEIN, BLOOD, LEUKOCYTES, NITRITE, OR GLUCOSE <1000 mg/dL.  I-stat chem 8, ed     Status: Abnormal   Collection Time: 03/08/16  7:53 PM  Result Value Ref Range   Sodium 140 135 - 145 mmol/L   Potassium 3.8 3.5 - 5.1 mmol/L   Chloride 99 (L) 101 - 111 mmol/L   BUN 33 (H) 6 - 20 mg/dL   Creatinine, Ser 1.20 0.61 - 1.24 mg/dL   Glucose, Bld 136 (H) 65 - 99 mg/dL   Calcium, Ion 1.16 1.13 - 1.30 mmol/L   TCO2 29 0 - 100 mmol/L   Hemoglobin 12.9 (L) 13.0 - 17.0 g/dL   HCT 38.0 (L) 39.0 - 52.0 %  CBC with Differential/Platelet     Status: Abnormal   Collection Time: 03/08/16  8:32 PM  Result Value Ref Range   WBC 8.8 4.0 - 10.5 K/uL   RBC 3.86 (L) 4.22 - 5.81 MIL/uL   Hemoglobin 12.7 (L) 13.0 - 17.0 g/dL   HCT 36.7 (L) 39.0 - 52.0 %   MCV 95.1 78.0 - 100.0 fL   MCH 32.9 26.0 - 34.0 pg   MCHC 34.6 30.0 - 36.0 g/dL   RDW 13.2 11.5 - 15.5 %   Platelets 187 150 - 400 K/uL   Neutrophils Relative % 71 %   Neutro Abs 6.3 1.7 - 7.7 K/uL   Lymphocytes Relative 22 %   Lymphs Abs 1.9 0.7 - 4.0 K/uL   Monocytes Relative 7 %   Monocytes Absolute 0.6 0.1 - 1.0 K/uL   Eosinophils Relative 0 %   Eosinophils Absolute  0.0 0.0 - 0.7 K/uL   Basophils Relative 0 %   Basophils Absolute 0.0 0.0 - 0.1 K/uL  Comprehensive metabolic panel     Status: Abnormal   Collection Time: 03/08/16  8:32 PM  Result Value Ref Range   Sodium 138 135 - 145 mmol/L   Potassium 3.8 3.5 - 5.1 mmol/L   Chloride 101 101 - 111 mmol/L   CO2 28 22 - 32 mmol/L   Glucose, Bld 139 (H) 65 - 99 mg/dL   BUN 31 (H) 6 - 20 mg/dL   Creatinine, Ser 1.09 0.61 - 1.24 mg/dL  Calcium 9.6 8.9 - 10.3 mg/dL   Total Protein 7.1 6.5 - 8.1 g/dL   Albumin 4.6 3.5 - 5.0 g/dL   AST 37 15 - 41 U/L   ALT 29 17 - 63 U/L   Alkaline Phosphatase 51 38 - 126 U/L   Total Bilirubin 4.1 (H) 0.3 - 1.2 mg/dL   GFR calc non Af Amer 59 (L) >60 mL/min   GFR calc Af Amer >60 >60 mL/min    Comment: (NOTE) The eGFR has been calculated using the CKD EPI equation. This calculation has not been validated in all clinical situations. eGFR's persistently <60 mL/min signify possible Chronic Kidney Disease.    Anion gap 9 5 - 15  POC CBG, ED     Status: Abnormal   Collection Time: 03/09/16  2:21 AM  Result Value Ref Range   Glucose-Capillary 167 (H) 65 - 99 mg/dL  Glucose, capillary     Status: Abnormal   Collection Time: 03/09/16  6:35 AM  Result Value Ref Range   Glucose-Capillary 132 (H) 65 - 99 mg/dL   Comment 1 Notify RN    Comment 2 Document in Chart    Dg Chest 2 View  03/08/2016  CLINICAL DATA:  Acute onset of generalized weakness. Initial encounter. EXAM: CHEST  2 VIEW COMPARISON:  Chest radiograph performed 02/17/2015 FINDINGS: The lungs are well-aerated and clear. There is no evidence of focal opacification, pleural effusion or pneumothorax. The heart is normal in size; the mediastinal contour is within normal limits. No acute osseous abnormalities are seen. Chronic left-sided rib deformities are noted. IMPRESSION: No acute cardiopulmonary process seen. Electronically Signed   By: Garald Balding M.D.   On: 03/08/2016 22:30   Ct Head Wo Contrast  03/08/2016   CLINICAL DATA:  Status post multiple falls.  History of dementia. EXAM: CT HEAD WITHOUT CONTRAST CT CERVICAL SPINE WITHOUT CONTRAST TECHNIQUE: Multidetector CT imaging of the head and cervical spine was performed following the standard protocol without intravenous contrast. Multiplanar CT image reconstructions of the cervical spine were also generated. COMPARISON:  02/16/2015 FINDINGS: CT HEAD FINDINGS No mass effect or midline shift. No evidence of acute intracranial hemorrhage, or infarction. No abnormal extra-axial fluid collections. There is atrophy and chronic small vessel disease changes. Probably remote left basal ganglia lacunar infarct is seen. Basal cisterns are preserved. No depressed skull fractures. Visualized paranasal sinuses and mastoid air cells are not opacified. CT CERVICAL SPINE FINDINGS There is straightening of the cervical lordosis, likely positional. There is no evidence for acute fracture or dislocation. Multilevel moderate to severe osteoarthritic changes of the cervical spine, including posterior facet arthropathy. Carotid calcifications are seen. Prevertebral soft tissues have a normal appearance. Lung apices have a normal appearance. IMPRESSION: No acute intracranial abnormality. Atrophy, chronic microvascular disease. Remote left basal ganglia lacunar infarct. No evidence of acute traumatic injury to the cervical spine. Multilevel osteoarthritic changes of the cervical spine. Electronically Signed   By: Fidela Salisbury M.D.   On: 03/08/2016 19:35   Ct Cervical Spine Wo Contrast  03/08/2016  CLINICAL DATA:  Status post multiple falls.  History of dementia. EXAM: CT HEAD WITHOUT CONTRAST CT CERVICAL SPINE WITHOUT CONTRAST TECHNIQUE: Multidetector CT imaging of the head and cervical spine was performed following the standard protocol without intravenous contrast. Multiplanar CT image reconstructions of the cervical spine were also generated. COMPARISON:  02/16/2015 FINDINGS: CT HEAD  FINDINGS No mass effect or midline shift. No evidence of acute intracranial hemorrhage, or infarction. No abnormal extra-axial  fluid collections. There is atrophy and chronic small vessel disease changes. Probably remote left basal ganglia lacunar infarct is seen. Basal cisterns are preserved. No depressed skull fractures. Visualized paranasal sinuses and mastoid air cells are not opacified. CT CERVICAL SPINE FINDINGS There is straightening of the cervical lordosis, likely positional. There is no evidence for acute fracture or dislocation. Multilevel moderate to severe osteoarthritic changes of the cervical spine, including posterior facet arthropathy. Carotid calcifications are seen. Prevertebral soft tissues have a normal appearance. Lung apices have a normal appearance. IMPRESSION: No acute intracranial abnormality. Atrophy, chronic microvascular disease. Remote left basal ganglia lacunar infarct. No evidence of acute traumatic injury to the cervical spine. Multilevel osteoarthritic changes of the cervical spine. Electronically Signed   By: Fidela Salisbury M.D.   On: 03/08/2016 19:35   Mr Brain Wo Contrast  03/08/2016  CLINICAL DATA:  Initial evaluation for dementia. EXAM: MRI HEAD WITHOUT CONTRAST TECHNIQUE: Multiplanar, multiecho pulse sequences of the brain and surrounding structures were obtained without intravenous contrast. COMPARISON:  Prior CT from earlier the same day. FINDINGS: Diffuse prominence of the CSF containing spaces is compatible with advanced cerebral atrophy. Patchy and confluent T2/FLAIR hyperintensity within the periventricular and deep white matter both cerebral hemispheres most consistent with chronic small vessel ischemic disease, moderate nature. Chronic small vessel ischemic type changes present within the pons. Remote lacunar infarcts present within the left basal ganglia and right thalamus. No remote cortical ischemia appreciated. Numerous foci susceptibility artifact seen  scattered throughout the deep gray nuclei, brainstem, and cerebellum, with a few additional tiny foci within the cerebral hemispheres, most consistent with chronic small micro hemorrhages, likely related to chronic underlying hypertension. Acute curvilinear ischemic infarct involving the posterior aspect of the left lentiform nucleus extending into the left corona radiata present (series 4, image 30). No associated hemorrhage or mass effect. No other acute infarct. Gray-white matter differentiation otherwise maintained. Major intracranial vascular flow voids are preserved. No mass lesion, midline shift, or mass effect. Ventricular prominence related to global parenchymal volume loss without hydrocephalus. No extra-axial fluid collection. Major dural sinuses are grossly patent. Craniocervical junction normal. Scattered degenerative spondylolysis within the visualized upper cervical spine without significant stenosis. Incidental note made of a partially empty sella. No acute abnormality about the orbits. Patient is status post cataract extraction on the left. Paranasal sinuses are clear. Left mastoid effusion noted. Right mastoid air cells clear. Inner ear structures normal. Bone marrow signal intensity within normal limits. No scalp soft tissue abnormality. IMPRESSION: 1. Small acute ischemic nonhemorrhagic infarct involving the left lentiform nucleus/corona radiata. No associated mass effect. 2. Remote lacunar infarcts involving the left basal ganglia and right thalamus. 3. Multiple small chronic micro hemorrhages involving the deep gray nuclei, brainstem, and cerebellum, most likely related to chronic underlying hypertension. 4. Advanced cerebral atrophy with moderate chronic small vessel ischemic disease. 5. Left mastoid effusion. Electronically Signed   By: Jeannine Boga M.D.   On: 03/08/2016 22:09    Assessment: 82 y.o. male with multiple risk factors for stroke as well as history of previous stroke  presenting with an acute left subcortical ischemic infarction.  Stroke Risk Factors - atrial fibrillation, diabetes mellitus, hyperlipidemia and hypertension  Plan: 1. HgbA1c, fasting lipid panel 2. MRA  of the brain without contrast 3. PT consult, OT consult, Speech consult 4. Echocardiogram 5. Carotid dopplers 6. Prophylactic therapy-Anticoagulation: Pradaxa 7. Risk factor modification 8. Telemetry monitoring  C.R. Nicole Kindred, MD [facility] [phone]  03/09/2016, 6:42 AM

## 2016-03-09 NOTE — Progress Notes (Deleted)
OT Cancellation Note  Patient Details Name: Conn Bundick MRN: [id number] DOB: [birth year]   Cancelled Treatment:    Reason Eval/Treat Not Completed: Patient at procedure or test/ unavailable  Deago Burruss 03/09/2016, 2:08 PM  Lesle Chris, OTR/L [phone] 03/09/2016

## 2016-03-09 NOTE — Progress Notes (Signed)
STROKE TEAM PROGRESS NOTE   HISTORY OF PRESENT ILLNESS (per record) Dustin [name] is an 82 y.o. male history of atrial fibrillation on Pradaxa, hypertension, hyperlipidemia, stroke, diabetes mellitus and dementia, who was brought to the ED at [facility] with a history of recurrent falls and right-sided weakness. Exact onset is unclear. He was last known well on 03/07/2016. MRI showed multiple acute nonhemorrhagic infarction involving the left lentiform nucleus/corona radiata. NIH stroke score was 6. Dustin [name] was not administered IV t-PA secondary to  Beyond time under for treatment consideration. He was admitted for further evaluation and treatment.   SUBJECTIVE (INTERVAL HISTORY) Iopidine personally detailed history from the Dustin [name], Dustin [name] and Dustin [name] who arrived later for rounds. Dustin [name] is to have mild baseline dementia but has not been evaluated. He presented with 2-3 days till right-sided weakness and falls and injuries. The family informs me his had 4 falls in the last 2 weeks itself. He is on Pradaxa for anticoagulation for atrial fibrillation. He recently underwent dental extraction few days ago and is unclear  For how long  he had been off Pradaxa   OBJECTIVE Temp:  [98 F (36.7 C)-98.6 F (37 C)] 98.1 F (36.7 C) (06/02 0900) Pulse Rate:  [38-100] 54 (06/02 0900) Cardiac Rhythm:  [-] Atrial fibrillation (06/02 0708) Resp:  [16-20] 18 (06/02 0900) BP: (138-212)/(61-92) 138/64 mmHg (06/02 0900) SpO2:  [84 %-100 %] 93 % (06/02 0900) Weight:  [81.6 kg (179 lb 14.3 oz)-84.369 kg (186 lb)] 81.6 kg (179 lb 14.3 oz) (06/02 0300)  CBC:  Recent Labs Lab 03/08/16 1953 03/08/16 2032  WBC  --  8.8  NEUTROABS  --  6.3  HGB 12.9* 12.7*  HCT 38.0* 36.7*  MCV  --  95.1  PLT  --  123XX123    Basic Metabolic Panel:  Recent Labs Lab 03/08/16 1953 03/08/16 2032  NA 140 138  K 3.8 3.8  CL 99* 101  CO2  --  28  GLUCOSE 136* 139*  BUN 33* 31*  CREATININE 1.20 1.09  CALCIUM   --  9.6    Lipid Panel:    Component Value Date/Time   CHOL 163 03/09/2016 0709   TRIG 81 03/09/2016 0709   HDL 57 03/09/2016 0709   CHOLHDL 2.9 03/09/2016 0709   VLDL 16 03/09/2016 0709   LDLCALC 90 03/09/2016 0709   HgbA1c: No results found for: HGBA1C Urine Drug Screen: No results found for: LABOPIA, COCAINSCRNUR, LABBENZ, AMPHETMU, THCU, LABBARB    IMAGING  Dg Chest 2 View  03/08/2016  CLINICAL DATA:  Acute onset of generalized weakness. Initial encounter. EXAM: CHEST  2 VIEW COMPARISON:  Chest radiograph performed 02/17/2015 FINDINGS: The lungs are well-aerated and clear. There is no evidence of focal opacification, pleural effusion or pneumothorax. The heart is normal in size; the mediastinal contour is within normal limits. No acute osseous abnormalities are seen. Chronic left-sided rib deformities are noted. IMPRESSION: No acute cardiopulmonary process seen. Electronically Signed   By: Garald Balding M.D.   On: 03/08/2016 22:30   Ct Head Wo Contrast  03/08/2016  CLINICAL DATA:  Status post multiple falls.  History of dementia. EXAM: CT HEAD WITHOUT CONTRAST CT CERVICAL SPINE WITHOUT CONTRAST TECHNIQUE: Multidetector CT imaging of the head and cervical spine was performed following the standard protocol without intravenous contrast. Multiplanar CT image reconstructions of the cervical spine were also generated. COMPARISON:  02/16/2015 FINDINGS: CT HEAD FINDINGS No mass effect or midline shift. No evidence of acute intracranial hemorrhage, or  infarction. No abnormal extra-axial fluid collections. There is atrophy and chronic small vessel disease changes. Probably remote left basal ganglia lacunar infarct is seen. Basal cisterns are preserved. No depressed skull fractures. Visualized paranasal sinuses and mastoid air cells are not opacified. CT CERVICAL SPINE FINDINGS There is straightening of the cervical lordosis, likely positional. There is no evidence for acute fracture or dislocation.  Multilevel moderate to severe osteoarthritic changes of the cervical spine, including posterior facet arthropathy. Carotid calcifications are seen. Prevertebral soft tissues have a normal appearance. Lung apices have a normal appearance. IMPRESSION: No acute intracranial abnormality. Atrophy, chronic microvascular disease. Remote left basal ganglia lacunar infarct. No evidence of acute traumatic injury to the cervical spine. Multilevel osteoarthritic changes of the cervical spine. Electronically Signed   By: Fidela Salisbury M.D.   On: 03/08/2016 19:35   Ct Cervical Spine Wo Contrast  03/08/2016  CLINICAL DATA:  Status post multiple falls.  History of dementia. EXAM: CT HEAD WITHOUT CONTRAST CT CERVICAL SPINE WITHOUT CONTRAST TECHNIQUE: Multidetector CT imaging of the head and cervical spine was performed following the standard protocol without intravenous contrast. Multiplanar CT image reconstructions of the cervical spine were also generated. COMPARISON:  02/16/2015 FINDINGS: CT HEAD FINDINGS No mass effect or midline shift. No evidence of acute intracranial hemorrhage, or infarction. No abnormal extra-axial fluid collections. There is atrophy and chronic small vessel disease changes. Probably remote left basal ganglia lacunar infarct is seen. Basal cisterns are preserved. No depressed skull fractures. Visualized paranasal sinuses and mastoid air cells are not opacified. CT CERVICAL SPINE FINDINGS There is straightening of the cervical lordosis, likely positional. There is no evidence for acute fracture or dislocation. Multilevel moderate to severe osteoarthritic changes of the cervical spine, including posterior facet arthropathy. Carotid calcifications are seen. Prevertebral soft tissues have a normal appearance. Lung apices have a normal appearance. IMPRESSION: No acute intracranial abnormality. Atrophy, chronic microvascular disease. Remote left basal ganglia lacunar infarct. No evidence of acute  traumatic injury to the cervical spine. Multilevel osteoarthritic changes of the cervical spine. Electronically Signed   By: Fidela Salisbury M.D.   On: 03/08/2016 19:35   Mr Brain Wo Contrast  03/08/2016  CLINICAL DATA:  Initial evaluation for dementia. EXAM: MRI HEAD WITHOUT CONTRAST TECHNIQUE: Multiplanar, multiecho pulse sequences of the brain and surrounding structures were obtained without intravenous contrast. COMPARISON:  Prior CT from earlier the same day. FINDINGS: Diffuse prominence of the CSF containing spaces is compatible with advanced cerebral atrophy. Patchy and confluent T2/FLAIR hyperintensity within the periventricular and deep white matter both cerebral hemispheres most consistent with chronic small vessel ischemic disease, moderate nature. Chronic small vessel ischemic type changes present within the pons. Remote lacunar infarcts present within the left basal ganglia and right thalamus. No remote cortical ischemia appreciated. Numerous foci susceptibility artifact seen scattered throughout the deep gray nuclei, brainstem, and cerebellum, with a few additional tiny foci within the cerebral hemispheres, most consistent with chronic small micro hemorrhages, likely related to chronic underlying hypertension. Acute curvilinear ischemic infarct involving the posterior aspect of the left lentiform nucleus extending into the left corona radiata present (series 4, image 30). No associated hemorrhage or mass effect. No other acute infarct. Gray-white matter differentiation otherwise maintained. Major intracranial vascular flow voids are preserved. No mass lesion, midline shift, or mass effect. Ventricular prominence related to global parenchymal volume loss without hydrocephalus. No extra-axial fluid collection. Major dural sinuses are grossly patent. Craniocervical junction normal. Scattered degenerative spondylolysis within the visualized upper cervical spine  without significant stenosis.  Incidental note made of a partially empty sella. No acute abnormality about the orbits. Dustin [name] is status post cataract extraction on the left. Paranasal sinuses are clear. Left mastoid effusion noted. Right mastoid air cells clear. Inner ear structures normal. Bone marrow signal intensity within normal limits. No scalp soft tissue abnormality. IMPRESSION: 1. Small acute ischemic nonhemorrhagic infarct involving the left lentiform nucleus/corona radiata. No associated mass effect. 2. Remote lacunar infarcts involving the left basal ganglia and right thalamus. 3. Multiple small chronic micro hemorrhages involving the deep gray nuclei, brainstem, and cerebellum, most likely related to chronic underlying hypertension. 4. Advanced cerebral atrophy with moderate chronic small vessel ischemic disease. 5. Left mastoid effusion. Electronically Signed   By: Jeannine Boga M.D.   On: 03/08/2016 22:09    PHYSICAL EXAM Pleasant elderly Caucasian male. He is restless and wants to go home. . Afebrile. Head is nontraumatic. Neck is supple without bruit.    Cardiac exam no murmur or gallop. Lungs are clear to auscultation. Distal pulses are well felt. Neurological Exam :  Awake alert oriented to place and person only. Diminished attention, registration and recall. Poor insight into his condition. Follows one and two-step commands only. No aphasia, apraxia dysarthria. Pupils equal reactive. Fundi were not visualized. Vision acuity and fields seem adequate. Face is symmetric without weakness. Tongue midline. Motor system exam no upper extremity drift mild right lower extremity drift. Mild weakness of right grip and intrinsic hand muscles and right hip flexors. Sensation diminished on the right hemibody compared to the left. Plantars downgoing. Coordination slow but accurate on the normal on the left. Gait was not tested. ASSESSMENT/PLAN Dustin [name] is a 82 y.o. male with history of atrial fibrillation on  Pradaxa, hypertension, hyperlipidemia, stroke, diabetes mellitus and dementia presenting with recurrent falls and R sided weakness. He did not receive IV t-PA due to delay in arrival.   Stroke:  Dominant left lentiform nucleus/corona radiata infarct secondary to small vessel disease but contribution from atrial fibrillation with suboptimal anticoagulation cannot be ruled out  Resultant right hemiparesis .baseline mild dementia  MRI   left lentiform nucleus/corona radiata infarct. Old L basal ganglia and R thalamic lacunes. small vessel disease.   TCd ordered  Carotid Doppler  pending   2D Echo  pending   LDL 90  HgbA1c pending  pradaxa for VTE prophylaxis  DIET SOFT Room service appropriate?: Yes; Fluid consistency:: Thin  Pradaxa (dabigatran) twice a day prior to admission, now on Pradaxa (dabigatran) twice a day  Dustin [name] counseled to be compliant with his antithrombotic medications  Ongoing aggressive stroke risk factor management  Therapy recommendations:  pending   Disposition:  pending  (lives with Dustin [name] and Dustin [name])  Hypertension  Stable  Permissive hypertension (OK if < 220/120) but gradually normalize in 5-7 days  Hyperlipidemia  Home meds:  mevacor 52, changed to pravachol 40 in hospital  LDL 90, goal < 70  Continue statin at discharge  Diabetes  HgbA1c pending, goal < 7.0  Other Stroke Risk Factors  Advanced age  Former Cigarette smoker  Hx stroke/TIA  Unknown details several years ago  Family hx stroke (father)  Other Active Problems  Baseline dementia  Hospital day # 0  I have personally examined this Dustin [name], reviewed notes, independently viewed imaging studies, participated in medical decision making and plan of care. I have made any additions or clarifications directly to the above note. He presented with right-sided weakness secondary to left brain subcortical infarct  etiology small vessel disease plus cardiac embolic given history of  atrial fibrillation and he was recently off anticoagulation for dental extraction. Dustin [name] also has suspect mild baseline dementia. I had a long discussion of the bedside with Dustin [name]'s Dustin [name] as well as Dustin [name] and answered questions about his stroke evaluation, prognosis, stroke prevention. Greater than 50% time during this study foramina to visit was spent on counseling and coordination of care about stroke and fall risk prevention . Discussed care plan  with Dr. Sherral Hammers, MD Medical Director [place] Pager: [phone] 03/09/2016 5:53 PM     To contact Stroke Continuity provider, please refer to [email or web address] After hours, contact General Neurology

## 2016-03-09 NOTE — Progress Notes (Signed)
Patient seen and examined. Admitted after midnight secondary to CVA. Anxious, confused and slightly agitated, demanding to be discharge home. Work up for CVA in progress and overall in no acute distress. Neurology is on board and will follow rec's. Please refer to H&P written by Dr. Jonnie Finner for further info/details on admission.  Plan: -complete work up -follow neurology rec's -hemodynamically stable at this point.  Dustin [name] [id number]

## 2016-03-09 NOTE — H&P (Signed)
[facility] History and Physical  Dustin [name] [id number] DOB: 1927/05/24 DOA: 03/08/2016  Referring physician: Dr Zenia Resides PCP: Osborne Casco, MD   Chief Complaint: Mult falls and R leg weakness  HPI: Dustin [name] is a 82 y.o. male with hx of afib, HTN, DM2 and CVA 20 yrs ago who presents to ED with multiple falls and unable to stand on R leg.  R leg weak < 24 hrs, not clear onset.  In ED MRI showed acute L corona radiata/lentiform nucleus.  (also remote lacunar infarcts L basal ganglia and R thalamus, mult small microbleeds likely from chron HTN, ^^ atrophy).  Asked to see patient for admission.  EKG / CXR reviewed, EKG afib w min ST depression antlat leads. CXR clear.  CBC and Cmet unremarkable.   Patient has been falling for over 1 year, had a bad fall onto the face 1 yr ago. Has had 4 falls in the last 2 wks. He has had knee problems rx'd w cortisone shots.  He was a heavy drinker for years, but drinks only small amts now.  Hx of prostate cancer rx w surgery. Hx pancreatitis resultling in GB removal. Hx HTN 10-20 yrs and DM about 5 not on insulin.  Has had memory problems for last 1-2 yrs, not driving the last 1-2 years.  Was using a walker and now is needing a wheelchair.  Family having a difficult time caring for him.   He had some bad teeth and have 7 teeth removed yesterday.   He is on pradaxa for afib, hasn't had his evening dose yet.  Is on po abx for the dental surgery also.    Patient was born in [place].  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  Retired in [place]. Moved with his wife to daughter's home in [place] at daughter's request due to health concerns 2 yrs ago. Has been to rehab facility (including [facility]) with good response per the dtr about 1 yr ago.  Daughter sayshe has a living will and patient says to "let him go" if his heartstops or his breathing stops.  Family (daughter) concurs with whatever his wishes are, the pt'swife did not  enter the discussion.     Was admitted here May 2016 with fall at home 2 wks prior.  Xrays showed fractures of ribs 7, 8,. 9 and 35% pneumothorax.  Had some dementia but didn't acknowledge it.  Memory was poor perdcsumm.  Chest tube was placed.  PT rec'd  SNF and pt was dc'd to SNF.     ROS  denies CP  no joint pain   no HA  no blurry vision  no rash  no diarrhea  no nausea/ vomiting  no dysuria  no difficulty voiding  no change in urine color    Past Medical History  Past Medical History  Diagnosis Date  . Pancreatitis   . A-fib ([place])   . Cancer ([place]) 2012    skin cancers  . Prostate cancer ([place])   . Dementia   . Stroke ([place])   . Hypertension   . Diabetes mellitus without complication ([place])   . Hyperlipidemia   . Poor balance   . Bilateral knee pain   . Arthritis    Past Surgical History  Past Surgical History  Procedure Laterality Date  . Eye surgery    . Appendectomy    . Cholecystectomy    . Pancreas surgery     Family History  Family History  Problem  Relation Age of Onset  . Heart attack Neg Hx   . Stroke Father   . Diabetes Mellitus II Sister   . Prostate cancer Neg Hx   . Colon cancer Father    Social History  reports that he has quit smoking. He has never used smokeless tobacco. He reports that he does not drink alcohol or use illicit drugs. Allergies No Known Allergies Home medications Prior to Admission medications   Medication Sig Start Date End Date Taking? Authorizing Provider  amoxicillin (AMOXIL) 500 MG tablet Take 500 mg by mouth 4 (four) times daily. ABT Start Date 03/07/16 & End Date 03/17/16.   Yes Historical Provider, MD  brimonidine (ALPHAGAN P) 0.1 % SOLN Place 1 drop into both eyes 2 (two) times daily.    Yes Historical Provider, MD  dabigatran (PRADAXA) 150 MG CAPS capsule Take 150 mg by mouth 2 (two) times daily.   Yes Historical Provider, MD  lovastatin (MEVACOR) 40 MG tablet Take 40 mg by mouth daily.    Yes Historical Provider, MD   metFORMIN (GLUCOPHAGE) 500 MG tablet Take 500 mg by mouth daily.   Yes Historical Provider, MD  sertraline (ZOLOFT) 25 MG tablet Take 1 tablet by mouth daily. 01/03/16  Yes Historical Provider, MD  valsartan-hydrochlorothiazide (DIOVAN-HCT) 80-12.5 MG per tablet Take 1 tablet by mouth daily.   Yes Historical Provider, MD   Liver Function Tests  Recent Labs Lab 03/08/16 2032  AST 37  ALT 29  ALKPHOS 51  BILITOT 4.1*  PROT 7.1  ALBUMIN 4.6   No results for input(s): LIPASE, AMYLASE in the last 168 hours. CBC  Recent Labs Lab 03/08/16 1953 03/08/16 2032  WBC  --  8.8  NEUTROABS  --  6.3  HGB 12.9* 12.7*  HCT 38.0* 36.7*  MCV  --  95.1  PLT  --  123XX123   Basic Metabolic Panel  Recent Labs Lab 03/08/16 1953 03/08/16 2032  NA 140 138  K 3.8 3.8  CL 99* 101  CO2  --  28  GLUCOSE 136* 139*  BUN 33* 31*  CREATININE 1.20 1.09  CALCIUM  --  9.6     Filed Vitals:   03/08/16 2023 03/08/16 2030 03/08/16 2200 03/08/16 2305  BP: 212/90 167/87 196/85   Pulse: 70 75 100   Temp:    98.5 F (36.9 C)  TempSrc:      Resp: 17 19 20    SpO2: 97% 96% 99%    Exam: Gen disheveled WM elderly, WDWN No rash, cyanosis or gangrene Sclera anicteric, throat clear  No jvd or bruits Chest clear bilat RRR no MRG Abd soft ntnd no mass or ascites +bs GU normal male MS no joint effusions or deformity Ext no LE or UE edema / no wounds or ulcers Neuro > oriented to "hospital" not to year or specificplace, knows people in the room VF's intact, no UE drift, no facial droop,  R leg weak 3/5 compared to L leg 4+/5 UE's 4+/5 bilat     Assessment: 1   Acute CVA L basal ganglia w RLE paresis 2   Chron debility w deconditioning and recurrent falling 3   Memory loss - prob mild /mod dementia 4   HTN on valsartan/ HCT, BP's up some 5   DM cont metformin 6   Chronic afib 7   Dispo - may need SNF, await PTrecs.   8   DNR - pt request and daughter concurs  Plan - admit, CVA w/u,  poss need  for rehab/ SNF     Sol Blazing [facility] Pager [phone]  Cell [phone]  If 7PM-7AM, please contact night-coverage [email or web address] Password [email or web address] 03/09/2016, 12:22 AM

## 2016-03-09 NOTE — ED Notes (Signed)
All patients belongings sent home with family, including white t-shirt

## 2016-03-09 NOTE — Progress Notes (Addendum)
*  PRELIMINARY RESULTS* Vascular Ultrasound Carotid Duplex (Doppler) has been completed.   Findings suggest 1-39% internal carotid artery stenosis bilaterally. Vertebral arteries are patent with antegrade flow.  Transcranial doppler has been completed.  03/09/2016 2:21 PM Maudry Mayhew, RVT, RDCS, RDMS

## 2016-03-09 NOTE — Discharge Instructions (Signed)
Fall Prevention in Hospitals, Adult As a hospital patient, your condition and the treatments you receive can increase your risk for falls. Some additional risk factors for falls in a hospital include:  Being in an unfamiliar environment.  Being on bed rest.  Your surgery.  Taking certain medicines.  Your tubing requirements, such as intravenous (IV) therapy or catheters. It is important that you learn how to decrease fall risks while at the hospital. Below are important tips that can help prevent falls. SAFETY TIPS FOR PREVENTING FALLS Talk about your risk of falling.  Ask your health care provider why you are at risk for falling. Is it your medicine, illness, tubing placement, or something else?  Make a plan with your health care provider to keep you safe from falls.  Ask your health care provider or pharmacist about side effects of your medicines. Some medicines can make you dizzy or affect your coordination. Ask for help.  Ask for help before getting out of bed. You may need to press your call button.  Ask for assistance in getting safely to the toilet.  Ask for a walker or cane to be put at your bedside. Ask that most of the side rails on your bed be placed up before your health care provider leaves the room.  Ask family or friends to sit with you.  Ask for things that are out of your reach, such as your glasses, hearing aids, telephone, bedside table, or call button. Follow these tips to avoid falling:  Stay lying or seated, rather than standing, while waiting for help.  Wear rubber-soled slippers or shoes whenever you walk in the hospital.  Avoid quick, sudden movements.  Change positions slowly.  Sit on the side of your bed before standing.  Stand up slowly and wait before you start to walk.  Let your health care provider know if there is a spill on the floor.  Pay careful attention to the medical equipment, electrical cords, and tubes around you.  When you  need help, use your call button by your bed or in the bathroom. Wait for one of your health care providers to help you.  If you feel dizzy or unsure of your footing, return to bed and wait for assistance.  Avoid being distracted by the TV, telephone, or another person in your room.  Do not lean or support yourself on rolling objects, such as IV poles or bedside tables.   This information is not intended to replace advice given to you by your health care provider. Make sure you discuss any questions you have with your health care provider.   Document Released: 09/21/2000 Document Revised: 10/15/2014 Document Reviewed: 06/01/2012 Elsevier Interactive Patient Education 2016 [place] on my medicine - Pradaxa (dabigatran)  This medication education was reviewed with me or my healthcare representative as part of my discharge preparation.    Why was Pradaxa prescribed for you? Pradaxa was prescribed for you to reduce the risk of forming blood clots that cause a stroke if you have a medical condition called atrial fibrillation (a type of irregular heartbeat).    What do you Need to know about PradAXa? Take your Pradaxa TWICE DAILY - one capsule in the morning and one tablet in the evening with or without food.  It would be best to take the doses about the same time each day.  The capsules should not be broken, chewed or opened - they must be swallowed whole.  Do not store  Pradaxa in other medication containers - once the bottle is opened the Pradaxa should be used within FOUR months; throw away any capsules that havent been by that time.  Take Pradaxa exactly as prescribed by your doctor.  DO NOT stop taking Pradaxa without talking to the doctor who prescribed the medication.  Stopping without other stroke prevention medication to take the place of Pradaxa may increase your risk of developing a clot that causes a stroke.  Refill your prescription before you run  out.  After discharge, you should have regular check-up appointments with your healthcare provider that is prescribing your Pradaxa.  In the future your dose may need to be changed if your kidney function or weight changes by a significant amount.  What do you do if you miss a dose? If you miss a dose, take it as soon as you remember on the same day.  If your next dose is less than 6 hours away, skip the missed dose.  Do not take two doses of PRADAXA at the same time.  Important Safety Information A possible side effect of Pradaxa is bleeding. You should call your healthcare provider right away if you experience any of the following: ? Bleeding from an injury or your nose that does not stop. ? Unusual colored urine (red or dark brown) or unusual colored stools (red or black). ? Unusual bruising for unknown reasons. ? A serious fall or if you hit your head (even if there is no bleeding).  Some medicines may interact with Pradaxa and might increase your risk of bleeding or clotting while on Pradaxa. To help avoid this, consult your healthcare provider or pharmacist prior to using any new prescription or non-prescription medications, including herbals, vitamins, non-steroidal anti-inflammatory drugs (NSAIDs) and supplements.  This website has more information on Pradaxa (dabigatran): [email or web address]://[email or web address].com

## 2016-03-10 ENCOUNTER — Inpatient Hospital Stay (HOSPITAL_COMMUNITY): Payer: Medicare PPO

## 2016-03-10 DIAGNOSIS — I639 Cerebral infarction, unspecified: Secondary | ICD-10-CM | POA: Insufficient documentation

## 2016-03-10 DIAGNOSIS — I638 Other cerebral infarction: Secondary | ICD-10-CM

## 2016-03-10 DIAGNOSIS — I6789 Other cerebrovascular disease: Secondary | ICD-10-CM

## 2016-03-10 DIAGNOSIS — R5381 Other malaise: Secondary | ICD-10-CM

## 2016-03-10 DIAGNOSIS — R29898 Other symptoms and signs involving the musculoskeletal system: Secondary | ICD-10-CM

## 2016-03-10 LAB — ECHOCARDIOGRAM COMPLETE
Height: 70 in
Weight: 2878.33 oz

## 2016-03-10 LAB — GLUCOSE, CAPILLARY
GLUCOSE-CAPILLARY: 152 mg/dL — AB (ref 65–99)
Glucose-Capillary: 156 mg/dL — ABNORMAL HIGH (ref 65–99)
Glucose-Capillary: 121 mg/dL — ABNORMAL HIGH (ref 65–99)

## 2016-03-10 LAB — URINE CULTURE: CULTURE: NO GROWTH

## 2016-03-10 LAB — HEMOGLOBIN A1C
Hgb A1c MFr Bld: 6.6 % — ABNORMAL HIGH (ref 4.8–5.6)
Mean Plasma Glucose: 143 mg/dL

## 2016-03-10 NOTE — Progress Notes (Signed)
  Echocardiogram 2D Echocardiogram has been performed.  Dustin [name] 03/10/2016, 3:05 PM

## 2016-03-10 NOTE — Progress Notes (Signed)
[facility] PROGRESS NOTE  Dustin [name] [id number] DOB: March 25, 1927 DOA: 03/08/2016 PCP: Osborne Casco, MD  Interim summary and HPI 82 y.o. male history of atrial fibrillation on Pradaxa, hypertension, hyperlipidemia, review stroke, diabetes mellitus and dementia, who was brought [facility] the ED at [facility] with a history of recurrent falls and right-sided weakness. Exact onset is unclear. He was last known well on 03/07/2016. MRI showed multiple acute nonhemorrhagic infarction involving the left lentiform nucleus/corona radiata. NIH stroke score was 6.  Complete work up; pradaxa for secondary prevention. CIR consult requested. Follow neurology rec's  Assessment/Plan: 1-Dominant left lentiform nucleus/corona radiata stroke -had hx of A. Fib -2-D echo pending -secondary prevention with pradaxa -concerns of potential medication compliance issues  -per PT will benefit of CIR for rehab -will follow neurology rec's  2- HTN -stable currently -allowing permissive HTN while in acute ischemic setting   3-HLD: -continue statins -LDL 90  4-diabetes type 2 -A1C 6.6 -continue SSI while inpatient  5-depression/anxiety -continue zoloft  6-glaucoma: Continue alphagan   7-dementia: -continue supportive care -PRN haldol for agitation   Code Status: DNR Family Communication: no family at bedside  Disposition Plan: will complete stroke work up and he will need CIR or SNF for rehab. Will follow rec's.   Consultants:  Neurology   Procedures:  MRI left lentiform nucleus/corona radiata infarct. Old L basal ganglia and R thalamic lacunes. small vessel disease.  TCd - Low mean flow velocities throughout anterior and posterior cerebral circulations due to suboptimal waveforms from poor occipital windows.Globally elevated pulsatility indices suggest diffuse intracranial atherosclerosis.     Carotid Doppler - 1-39% internal carotid artery stenosis  bilaterally. Vertebral arteries are patent with antegrade flow.  2D Echo - pending  Antibiotics:  None   HPI/Subjective: Pleasantly confused, in no acute distress. Denies CP and SOB. No aphasia or dysarthria. Patient with positive right hemiparesis   Objective: Filed Vitals:   03/10/16 1350 03/10/16 1727  BP: 127/77 115/67  Pulse: 70 70  Temp: 97.7 F (36.5 C) 97.6 F (36.4 C)  Resp: 18 18    Intake/Output Summary (Last 24 hours) at 03/10/16 1806 Last data filed at 03/10/16 1626  Gross per 24 hour  Intake    240 ml  Output    600 ml  Net   -360 ml   Filed Weights   03/09/16 0300  Weight: 81.6 kg (179 lb 14.3 oz)    Exam:   General:  Afebrile, no CP, oriented X2 (with some intermittent confusion and confabulation); had hx of dementia at baseline. Right hemiparesis on present on exam. No dysarthria   Cardiovascular: rate controlled, no rubs or gallops  Respiratory: no wheezing or crackles, good air movement   Abdomen: soft, NT, ND, positive BS  Musculoskeletal: no cyanosis or clubbing   Neurologic exam: no apraxia, no aphasia or dysarthria; PERRLA, MS 4/5 on left side and 3/5 on right side upper and lower extremities). Gait not tested   Data Reviewed: Basic Metabolic Panel:  Recent Labs Lab 03/08/16 1953 03/08/16 2032  NA 140 138  K 3.8 3.8  CL 99* 101  CO2  --  28  GLUCOSE 136* 139*  BUN 33* 31*  CREATININE 1.20 1.09  CALCIUM  --  9.6   Liver Function Tests:  Recent Labs Lab 03/08/16 2032  AST 37  ALT 29  ALKPHOS 51  BILITOT 4.1*  PROT 7.1  ALBUMIN 4.6   CBC:  Recent Labs Lab 03/08/16 1953 03/08/16 2032  WBC  --  8.8  NEUTROABS  --  6.3  HGB 12.9* 12.7*  HCT 38.0* 36.7*  MCV  --  95.1  PLT  --  187    CBG:  Recent Labs Lab 03/09/16 1110 03/09/16 1632 03/09/16 2131 03/10/16 0624 03/10/16 1619  GLUCAP 149* 172* 171* 152* 156*    Recent Results (from the past 240 hour(s))  Urine culture     Status: None   Collection  Time: 03/08/16  6:31 PM  Result Value Ref Range Status   Specimen Description URINE, CLEAN CATCH  Final   Special Requests NONE  Final   Culture NO GROWTH Performed at [facility]   Final   Report Status 03/10/2016 FINAL  Final     Studies: Dg Chest 2 View  03/08/2016  CLINICAL DATA:  Acute onset of generalized weakness. Initial encounter. EXAM: CHEST  2 VIEW COMPARISON:  Chest radiograph performed 02/17/2015 FINDINGS: The lungs are well-aerated and clear. There is no evidence of focal opacification, pleural effusion or pneumothorax. The heart is normal in size; the mediastinal contour is within normal limits. No acute osseous abnormalities are seen. Chronic left-sided rib deformities are noted. IMPRESSION: No acute cardiopulmonary process seen. Electronically Signed   By: Garald Balding M.D.   On: 03/08/2016 22:30   Ct Head Wo Contrast  03/08/2016  CLINICAL DATA:  Status post multiple falls.  History of dementia. EXAM: CT HEAD WITHOUT CONTRAST CT CERVICAL SPINE WITHOUT CONTRAST TECHNIQUE: Multidetector CT imaging of the head and cervical spine was performed following the standard protocol without intravenous contrast. Multiplanar CT image reconstructions of the cervical spine were also generated. COMPARISON:  02/16/2015 FINDINGS: CT HEAD FINDINGS No mass effect or midline shift. No evidence of acute intracranial hemorrhage, or infarction. No abnormal extra-axial fluid collections. There is atrophy and chronic small vessel disease changes. Probably remote left basal ganglia lacunar infarct is seen. Basal cisterns are preserved. No depressed skull fractures. Visualized paranasal sinuses and mastoid air cells are not opacified. CT CERVICAL SPINE FINDINGS There is straightening of the cervical lordosis, likely positional. There is no evidence for acute fracture or dislocation. Multilevel moderate to severe osteoarthritic changes of the cervical spine, including posterior facet arthropathy. Carotid  calcifications are seen. Prevertebral soft tissues have a normal appearance. Lung apices have a normal appearance. IMPRESSION: No acute intracranial abnormality. Atrophy, chronic microvascular disease. Remote left basal ganglia lacunar infarct. No evidence of acute traumatic injury to the cervical spine. Multilevel osteoarthritic changes of the cervical spine. Electronically Signed   By: Fidela Salisbury M.D.   On: 03/08/2016 19:35   Ct Cervical Spine Wo Contrast  03/08/2016  CLINICAL DATA:  Status post multiple falls.  History of dementia. EXAM: CT HEAD WITHOUT CONTRAST CT CERVICAL SPINE WITHOUT CONTRAST TECHNIQUE: Multidetector CT imaging of the head and cervical spine was performed following the standard protocol without intravenous contrast. Multiplanar CT image reconstructions of the cervical spine were also generated. COMPARISON:  02/16/2015 FINDINGS: CT HEAD FINDINGS No mass effect or midline shift. No evidence of acute intracranial hemorrhage, or infarction. No abnormal extra-axial fluid collections. There is atrophy and chronic small vessel disease changes. Probably remote left basal ganglia lacunar infarct is seen. Basal cisterns are preserved. No depressed skull fractures. Visualized paranasal sinuses and mastoid air cells are not opacified. CT CERVICAL SPINE FINDINGS There is straightening of the cervical lordosis, likely positional. There is no evidence for acute fracture or dislocation. Multilevel moderate to severe osteoarthritic changes of the cervical spine, including posterior facet arthropathy.  Carotid calcifications are seen. Prevertebral soft tissues have a normal appearance. Lung apices have a normal appearance. IMPRESSION: No acute intracranial abnormality. Atrophy, chronic microvascular disease. Remote left basal ganglia lacunar infarct. No evidence of acute traumatic injury to the cervical spine. Multilevel osteoarthritic changes of the cervical spine. Electronically Signed   By:  Fidela Salisbury M.D.   On: 03/08/2016 19:35   Mr Brain Wo Contrast  03/08/2016  CLINICAL DATA:  Initial evaluation for dementia. EXAM: MRI HEAD WITHOUT CONTRAST TECHNIQUE: Multiplanar, multiecho pulse sequences of the brain and surrounding structures were obtained without intravenous contrast. COMPARISON:  Prior CT from earlier the same day. FINDINGS: Diffuse prominence of the CSF containing spaces is compatible with advanced cerebral atrophy. Patchy and confluent T2/FLAIR hyperintensity within the periventricular and deep white matter both cerebral hemispheres most consistent with chronic small vessel ischemic disease, moderate nature. Chronic small vessel ischemic type changes present within the pons. Remote lacunar infarcts present within the left basal ganglia and right thalamus. No remote cortical ischemia appreciated. Numerous foci susceptibility artifact seen scattered throughout the deep gray nuclei, brainstem, and cerebellum, with a few additional tiny foci within the cerebral hemispheres, most consistent with chronic small micro hemorrhages, likely related to chronic underlying hypertension. Acute curvilinear ischemic infarct involving the posterior aspect of the left lentiform nucleus extending into the left corona radiata present (series 4, image 30). No associated hemorrhage or mass effect. No other acute infarct. Gray-white matter differentiation otherwise maintained. Major intracranial vascular flow voids are preserved. No mass lesion, midline shift, or mass effect. Ventricular prominence related to global parenchymal volume loss without hydrocephalus. No extra-axial fluid collection. Major dural sinuses are grossly patent. Craniocervical junction normal. Scattered degenerative spondylolysis within the visualized upper cervical spine without significant stenosis. Incidental note made of a partially empty sella. No acute abnormality about the orbits. Patient is status post cataract extraction on  the left. Paranasal sinuses are clear. Left mastoid effusion noted. Right mastoid air cells clear. Inner ear structures normal. Bone marrow signal intensity within normal limits. No scalp soft tissue abnormality. IMPRESSION: 1. Small acute ischemic nonhemorrhagic infarct involving the left lentiform nucleus/corona radiata. No associated mass effect. 2. Remote lacunar infarcts involving the left basal ganglia and right thalamus. 3. Multiple small chronic micro hemorrhages involving the deep gray nuclei, brainstem, and cerebellum, most likely related to chronic underlying hypertension. 4. Advanced cerebral atrophy with moderate chronic small vessel ischemic disease. 5. Left mastoid effusion. Electronically Signed   By: Jeannine Boga M.D.   On: 03/08/2016 22:09    Scheduled Meds: .  stroke: mapping our early stages of recovery book   Does not apply Once  . amoxicillin  500 mg Oral QID  . brimonidine  1 drop Both Eyes BID  . dabigatran  150 mg Oral BID  . irbesartan  75 mg Oral Daily   And  . hydrochlorothiazide  12.5 mg Oral Daily  . insulin aspart  0-8 Units Subcutaneous TID WC  . metFORMIN  500 mg Oral Q breakfast  . pravastatin  40 mg Oral q1800  . sertraline  25 mg Oral Daily   Continuous Infusions:   Principal Problem:   Acute CVA (cerebrovascular accident) ([place]) Active Problems:   Dementia   Physical debility   Essential hypertension   Diabetes mellitus type 2, noninsulin dependent ([place])   Chronic atrial fibrillation ([place])   DNR (do not resuscitate)   Falls   Right leg weakness    Time spent: 30 minutes  Barton Dubois  [facility] Pager [phone]. If 7PM-7AM, please contact night-coverage at [email or web address], password [facility] 03/10/2016, 6:06 PM  LOS: 1 day

## 2016-03-10 NOTE — Evaluation (Signed)
Physical Therapy Evaluation Patient Details Name: Dustin [name] MRN: [id number] DOB: 1927-04-06 Today's Date: 03/10/2016   History of Present Illness  Pt is an 82 y.o. male with hx of afib, HTN, DM2 and CVA 20 yrs ago who presents to ED with multiple falls and unable to stand on R leg. R leg weak < 24 hrs, not clear onset. In ED MRI showed acute L corona radiata/lentiform nucleus. (also remote lacunar infarcts L basal ganglia and R thalamus, mult small microbleeds. PMH includes h/o Etoh use, Hx of prostate cancer rx w surgery  Clinical Impression  Unclear pt's baseline level of mobility prior to admission. Pt reports that his wife has health issues, therefore, may not be able to assist much upon discharge. He also reports that his dau will be available only prn "she is the [place]" and she is currently out of town in [place].  He answered questions inconsistently and appears to have poor insight to his deficits although he is highly motivated to incr his activity level. Therapy will continue to follow to assist with discharge planning and follow up recommendations. Will continue to follow patient while on this venue of care to progress mobility.    Follow Up Recommendations CIR    Equipment Recommendations  Rolling walker with 5" wheels;3in1 (PT);Wheelchair (measurements PT)    Recommendations for Other Services Rehab consult     Precautions / Restrictions Precautions Precautions: Fall Precaution Comments: multiple falls within past year Restrictions Weight Bearing Restrictions: No      Mobility  Bed Mobility Overal bed mobility: Needs Assistance Bed Mobility: Supine to Sit     Supine to sit: Min assist;HOB elevated     General bed mobility comments: with rail  Transfers Overall transfer level: Needs assistance Equipment used: Rolling walker (2 wheeled) Transfers: Sit to/from Stand Sit to Stand: Mod assist         General transfer comment: mod cues  for hand placement  Ambulation/Gait Ambulation/Gait assistance: Max assist;+2 safety/equipment (+1 follow with recliner for safety) Ambulation Distance (Feet): 10 Feet Assistive device: Rolling walker (2 wheeled) Gait Pattern/deviations: Step-to pattern;Decreased stance time - right;Decreased weight shift to left     General Gait Details: heavy lean to Rt, difficulty with Rt hand placement on RW, slowed, apraxic Rt leg advancement  Stairs            Wheelchair Mobility    Modified Rankin (Stroke Patients Only) Modified Rankin (Stroke Patients Only) Pre-Morbid Rankin Score: Slight disability Modified Rankin: Moderately severe disability     Balance Overall balance assessment: Needs assistance Sitting-balance support: Feet supported;Bilateral upper extremity supported Sitting balance-Leahy Scale: Fair Sitting balance - Comments: difficulty using Rt UE for support   Standing balance support: Bilateral upper extremity supported Standing balance-Leahy Scale: Zero Standing balance comment: Rt lateral lean                             Pertinent Vitals/Pain Pain Assessment: No/denies pain    Home Living Family/patient expects to be discharged to:: Private residence Living Arrangements: Spouse/significant other Available Help at Discharge: Family;Available 24 hours/day Type of Home: House Home Access: Level entry (via garage)     Home Layout: One level   Additional Comments: unable to fully assess home environment secondary to decr cognition, no family present    Prior Function Level of Independence: Independent         Comments: per pt     Hand  Dominance   Dominant Hand: Right    Extremity/Trunk Assessment   Upper Extremity Assessment: Defer to OT evaluation           Lower Extremity Assessment: RLE deficits/detail RLE Deficits / Details: 4/5 hip flexion, 4/5 knee ext, 4/5 PF, apraxic during gait    Cervical / Trunk Assessment: Normal   Communication   Communication: HOH  Cognition Arousal/Alertness: Awake/alert Behavior During Therapy: WFL for tasks assessed/performed Overall Cognitive Status: No family/caregiver present to determine baseline cognitive functioning       Memory: Decreased short-term memory              General Comments General comments (skin integrity, edema, etc.): Appears to demo intellectual awareness level, poor anticipatory awareness."I can stand to shower so I won't need a seat."    Exercises General Exercises - Lower Extremity Ankle Circles/Pumps: AROM;Right;10 reps;Seated Long Arc Quad: AROM;Right;5 reps;Seated Straight Leg Raises: AROM;Right;10 reps;Seated Hip Flexion/Marching: AROM;Right;5 reps;Seated      Assessment/Plan    PT Assessment Patient needs continued PT services  PT Diagnosis Difficulty walking;Abnormality of gait;Hemiplegia dominant side   PT Problem List Decreased strength;Decreased activity tolerance;Decreased balance;Decreased mobility;Decreased coordination;Decreased knowledge of use of DME;Decreased safety awareness  PT Treatment Interventions DME instruction;Gait training;Functional mobility training;Therapeutic activities;Therapeutic exercise;Balance training;Neuromuscular re-education;Cognitive remediation;Patient/family education   PT Goals (Current goals can be found in the Care Plan section) Acute Rehab PT Goals Patient Stated Goal: go home PT Goal Formulation: Patient unable to participate in goal setting Time For Goal Achievement: 03/17/16 Potential to Achieve Goals: Fair    Frequency Min 4X/week   Barriers to discharge Decreased caregiver support wife may not be able to provide physical assist upon discharge, dau available only prn per pt    Co-evaluation               End of Session Equipment Utilized During Treatment: Gait belt Activity Tolerance: Patient tolerated treatment well Patient left: in chair;with call bell/phone within  reach;with chair alarm set Nurse Communication: Mobility status         Time: 1111-1135 PT Time Calculation (min) (ACUTE ONLY): 24 min   Charges:   PT Evaluation $PT Eval Moderate Complexity: 1 Procedure     PT G CodesMalka So, PT [phone]  Maecyn Panning 03/10/2016, 4:05 PM

## 2016-03-10 NOTE — Progress Notes (Signed)
STROKE TEAM PROGRESS NOTE   HISTORY OF PRESENT ILLNESS (per record) Dustin [name] is an 82 y.o. male history of atrial fibrillation on Pradaxa, hypertension, hyperlipidemia, stroke, diabetes mellitus and dementia, who was brought to the ED at [facility] with a history of recurrent falls and right-sided weakness. Exact onset is unclear. He was last known well on 03/07/2016. MRI showed multiple acute nonhemorrhagic infarction involving the left lentiform nucleus/corona radiata. NIH stroke score was 6. Patient was not administered IV t-PA secondary to  beyond time under for treatment consideration. He was admitted for further evaluation and treatment.   SUBJECTIVE (INTERVAL HISTORY) No family members present. The patient voices no new complaints. zHe will need to go to snf for rehab   OBJECTIVE Temp:  [97.7 F (36.5 C)-98.6 F (37 C)] 97.7 F (36.5 C) (06/03 1350) Pulse Rate:  [54-81] 70 (06/03 1350) Cardiac Rhythm:  [-] Atrial fibrillation (06/03 0751) Resp:  [16-18] 18 (06/03 1350) BP: (111-157)/(57-105) 127/77 mmHg (06/03 1350) SpO2:  [97 %-100 %] 99 % (06/03 1350)  CBC:   Recent Labs Lab 03/08/16 1953 03/08/16 2032  WBC  --  8.8  NEUTROABS  --  6.3  HGB 12.9* 12.7*  HCT 38.0* 36.7*  MCV  --  95.1  PLT  --  123XX123    Basic Metabolic Panel:   Recent Labs Lab 03/08/16 1953 03/08/16 2032  NA 140 138  K 3.8 3.8  CL 99* 101  CO2  --  28  GLUCOSE 136* 139*  BUN 33* 31*  CREATININE 1.20 1.09  CALCIUM  --  9.6    Lipid Panel:     Component Value Date/Time   CHOL 163 03/09/2016 0709   TRIG 81 03/09/2016 0709   HDL 57 03/09/2016 0709   CHOLHDL 2.9 03/09/2016 0709   VLDL 16 03/09/2016 0709   LDLCALC 90 03/09/2016 0709   HgbA1c:  Lab Results  Component Value Date   HGBA1C 6.6* 03/09/2016   Urine Drug Screen: No results found for: LABOPIA, COCAINSCRNUR, LABBENZ, AMPHETMU, THCU, LABBARB    IMAGING  Dg Chest 2 View 03/08/2016   No acute  cardiopulmonary process seen.     Ct Head Wo Contrast 03/08/2016   No acute intracranial abnormality.  Atrophy, chronic microvascular disease.  Remote left basal ganglia lacunar infarct.      Ct Cervical Spine Wo Contrast 03/08/2016   No evidence of acute traumatic injury to the cervical spine.  Multilevel osteoarthritic changes of the cervical spine.     Mr Brain Wo Contrast 03/08/2016   1. Small acute ischemic nonhemorrhagic infarct involving the left lentiform nucleus/corona radiata.   No associated mass effect.  2. Remote lacunar infarcts involving the left basal ganglia and right thalamus.  3. Multiple small chronic micro hemorrhages involving the deep gray nuclei, brainstem, and cerebellum, most likely related to  chronic underlying hypertension.  4. Advanced cerebral atrophy with moderate chronic small vessel ischemic disease.  5. Left mastoid effusion.    Transcranial Dopplers 03/09/2016 Low mean flow velocities throughout anterior and posterior cerebral circulations due to suboptimal waveforms from poor occipital windows.Globally elevated pulsatility indices suggest diffuse intracranial atherosclerosis.    PHYSICAL EXAM Pleasant elderly Caucasian male. He is restless and wants to go home. . Afebrile. Head is nontraumatic. Neck is supple without bruit.    Cardiac exam no murmur or gallop. Lungs are clear to auscultation. Distal pulses are well felt. Neurological Exam :  Awake alert oriented to place and person only. Diminished  attention, registration and recall. Poor insight into his condition. Follows one and two-step commands only. No aphasia, apraxia dysarthria. Pupils equal reactive. Fundi were not visualized. Vision acuity and fields seem adequate. Face is symmetric without weakness. Tongue midline. Motor system exam no upper extremity drift mild right lower extremity drift. Mild weakness of right grip and intrinsic hand muscles and right hip flexors. Sensation  diminished on the right hemibody compared to the left. Plantars downgoing. Coordination slow but accurate on the normal on the left. Gait was not tested.    ASSESSMENT/PLAN Mr. Dustin [name] is a 82 y.o. male with history of atrial fibrillation on Pradaxa, hypertension, hyperlipidemia, stroke, diabetes mellitus and dementia presenting with recurrent falls and R sided weakness. He did not receive IV t-PA due to delay in arrival and anticoagulation.  Stroke:  Dominant left lentiform nucleus/corona radiata infarct secondary to small vessel disease but contribution from atrial fibrillation with suboptimal anticoagulation cannot be ruled out  Resultant right hemiparesis .baseline mild dementia  MRI   left lentiform nucleus/corona radiata infarct. Old L basal ganglia and R thalamic lacunes. small vessel disease.   TCd - See above  Carotid Doppler - 1-39% internal carotid artery stenosis bilaterally. Vertebral arteries are patent with antegrade flow.  2D Echo - pending  LDL 90  HgbA1c - 6.6  Pradaxa for VTE prophylaxis DIET SOFT Room service appropriate?: Yes; Fluid consistency:: Thin  Pradaxa (dabigatran) twice a day prior to admission, now on Pradaxa (dabigatran) twice a day  Patient counseled to be compliant with his antithrombotic medications  Ongoing aggressive stroke risk factor management  Therapy recommendations:  pending   Disposition:  pending  (lives with wife and daughter)  Hypertension  Stable  Permissive hypertension (OK if < 220/120) but gradually normalize in 5-7 days  Hyperlipidemia  Home meds:  mevacor 28, changed to pravachol 40 in hospital  LDL 90, goal < 70  Continue statin at discharge  Diabetes  HgbA1c 6.6, goal < 7.0  Other Stroke Risk Factors  Advanced age  Former Cigarette smoker  Hx stroke/TIA  Unknown details several years ago  Family hx stroke (father)  Other Active Problems  Baseline dementia  Recently off anticoagulation  for dental extraction  Hospital day # [place] PA-C [facility] Pager [phone] 03/10/2016, 2:34 PM .I have personally examined this patient, reviewed notes, independently viewed imaging studies, participated in medical decision making and plan of care. I have made any additions or clarifications directly to the above note. Agree with note above.    Antony Contras, MD Medical Director [place] Pager: [phone] 03/10/2016 3:47 PM     To contact Stroke Continuity provider, please refer to [email or web address] After hours, contact General Neurology

## 2016-03-10 NOTE — Evaluation (Signed)
Speech Language Pathology Evaluation Patient Details Name: Dustin [name] MRN: [id number] DOB: 02-21-1927 Today's Date: 03/10/2016 Time: [id number] SLP Time Calculation (min) (ACUTE ONLY): 25 min  Problem List:  Patient Active Problem List   Diagnosis Date Noted  . Acute CVA (cerebrovascular accident) ([place]) 03/09/2016  . Physical debility 03/09/2016  . Essential hypertension 03/09/2016  . Diabetes mellitus type 2, noninsulin dependent ([place]) 03/09/2016  . Chronic atrial fibrillation ([place]) 03/09/2016  . DNR (do not resuscitate) 03/09/2016  . Falls 03/09/2016  . Right leg weakness 03/09/2016  . Pancreatic mass 01/25/2016  . Dementia   . Fall 02/15/2015  . Multiple fractures of ribs of left side 02/15/2015  . Traumatic pneumothorax 02/14/2015   Past Medical History:  Past Medical History  Diagnosis Date  . Pancreatitis   . A-fib ([place])   . Cancer ([place]) 2012    skin cancers  . Prostate cancer ([place])   . Dementia   . Stroke ([place])   . Hypertension   . Diabetes mellitus without complication ([place])   . Hyperlipidemia   . Poor balance   . Bilateral knee pain   . Arthritis    Past Surgical History:  Past Surgical History  Procedure Laterality Date  . Eye surgery    . Appendectomy    . Cholecystectomy    . Pancreas surgery     HPI:  Dustin [name] is an 82 y.o. male history of atrial fibrillation on Pradaxa, hypertension, hyperlipidemia, review stroke, diabetes mellitus and dementia, who was brought to the ED at [facility] with a history of recurrent falls and right-sided weakness. Exact onset is unclear. He was last known well on 03/07/2016. MRI showed multiple acute nonhemorrhagic infarction involving the left lentiform nucleus/corona radiata. NIH stroke score was 6.   Assessment / Plan / Recommendation Clinical Impression  Pt presents with  moderate cognitive deficits, suspected to be worse than baseline functioning, though difficult to assess without family  members present (per MD notes family suspected mild dementia though pt has not been formally evaluated). RN reports pt easily verbally agitated though none exhibited during SLP interaction. Expressive and receptive language appear intact along with oral motor skills. Cognitive deficits characterized by decreased executive functioning skills, decreased recall of novel information, decreased insight and problem solving, and reduced thought organization. Pt states that his spouse is available 24 hours a day upon DC, however given hx of multiple falls, cognitive deficits, and acute onset of right sided weakness recommend SNF placement for short term rehab to maximize safety and independent functioning. ST to follow up for cognitive intervention during acute stay.      SLP Assessment  Patient needs continued Speech Lanaguage Pathology Services    Follow Up Recommendations  Skilled Nursing facility    Frequency and Duration min 1 x/week  1 week      SLP Evaluation Prior Functioning  Cognitive/Linguistic Baseline: Baseline deficits Type of Home: House  Lives With: Spouse Available Help at Discharge: Family;Available 24 hours/day Education: 12th  Vocation: Retired   Associate Professor  Overall Cognitive Status: Difficult to assess Arousal/Alertness: Awake/alert Orientation Level: Oriented to person;Disoriented to time;Oriented to situation Memory: Impaired Memory Impairment: Decreased recall of new information;Decreased short term memory Decreased Short Term Memory: Verbal basic;Functional basic Awareness: Impaired Problem Solving: Impaired Executive Function: Reasoning;Self Monitoring;Decision Making Reasoning: Impaired Decision Making: Impaired Self Monitoring: Impaired Behaviors: Poor frustration tolerance;Verbal agitation Safety/Judgment: Impaired    Comprehension  Auditory Comprehension Overall Auditory Comprehension: Appears within functional limits for tasks assessed Yes/No  Questions:  Within Functional Limits Interfering Components: Processing speed;Working Curator:  (states left visual deficit at baseline )    Expression Expression Primary Mode of Expression: Verbal Verbal Expression Overall Verbal Expression: Appears within functional limits for tasks assessed Written Expression Dominant Hand: Right (impacted by onset of right hemiplegia )   Oral / Motor  Oral Motor/Sensory Function Overall Oral Motor/Sensory Function: Within functional limits Motor Speech Overall Motor Speech: Appears within functional limits for tasks assessed   GO                   Dustin Chaco MA, CCC-SLP Acute Care Speech Language Pathologist    Dustin [name] 03/10/2016, 2:30 PM

## 2016-03-11 LAB — GLUCOSE, CAPILLARY
GLUCOSE-CAPILLARY: 135 mg/dL — AB (ref 65–99)
GLUCOSE-CAPILLARY: 210 mg/dL — AB (ref 65–99)
Glucose-Capillary: 143 mg/dL — ABNORMAL HIGH (ref 65–99)
Glucose-Capillary: 107 mg/dL — ABNORMAL HIGH (ref 65–99)

## 2016-03-11 MED ORDER — BISACODYL 10 MG RE SUPP
10.0000 mg | Freq: Every day | RECTAL | Status: DC | PRN
  Filled 2016-03-11: qty 1

## 2016-03-11 NOTE — Progress Notes (Signed)
Physical Therapy Treatment Patient Details Name: Dustin [name] MRN: [id number] DOB: 1927-04-03 Today's Date: 03/11/2016    History of Present Illness Pt is an 82 y.o. male with hx of afib, HTN, DM2 and CVA 20 yrs ago who presents to ED with multiple falls and unable to stand on R leg. R leg weak < 24 hrs, not clear onset. In ED MRI showed acute L corona radiata/lentiform nucleus. (also remote lacunar infarcts L basal ganglia and R thalamus, mult small microbleeds. PMH includes h/o Etoh use, Hx of prostate cancer rx w surgery    PT Comments    Pt progressing towards physical therapy goals. Was able to attempt x4 Sit<>Stand this session and overall demonstrated a good rehab effort. Continue to feel that this patient would benefit from continued therapy at the CIR level. Will continue to follow.   Follow Up Recommendations  CIR     Equipment Recommendations  Rolling walker with 5" wheels;3in1 (PT);Wheelchair (measurements PT)    Recommendations for Other Services Rehab consult     Precautions / Restrictions Precautions Precautions: Fall Precaution Comments: multiple falls within past year Restrictions Weight Bearing Restrictions: No    Mobility  Bed Mobility Overal bed mobility: Needs Assistance Bed Mobility: Supine to Sit     Supine to sit: Min guard;HOB elevated     General bed mobility comments: Heavy use of rail and increased time required. Hands on assist provided for trunk support.   Transfers Overall transfer level: Needs assistance Equipment used: Rolling walker (2 wheeled) Transfers: Sit to/from Stand Sit to Stand: Mod assist;+2 physical assistance         General transfer comment: Pt attempted sit<>stand x4 during session. Mod assist +2 was provided on all attempts, however pt was not able to maintain standing at end of session due to fatigue and reported "knees giving out".   Ambulation/Gait Ambulation/Gait assistance: Mod assist;Max assist;+2  physical assistance;+2 safety/equipment Ambulation Distance (Feet): 10 Feet Assistive device: Rolling walker (2 wheeled) Gait Pattern/deviations: Step-to pattern;Decreased stride length;Decreased weight shift to left;Decreased stance time - right (R lateral lean) Gait velocity: Decreased Gait velocity interpretation: Below normal speed for age/gender General Gait Details: heavy lean to Rt, difficulty with Rt hand placement on RW, slowed. Pt required increased assist to maintain upright posture and to assist into leaning towards midline. +2 for physical assist initially, with progression to +2 for chair follow once pt was mobile.    Stairs            Wheelchair Mobility    Modified Rankin (Stroke Patients Only) Modified Rankin (Stroke Patients Only) Pre-Morbid Rankin Score: Slight disability Modified Rankin: Moderately severe disability     Balance Overall balance assessment: Needs assistance Sitting-balance support: Feet supported;No upper extremity supported Sitting balance-Leahy Scale: Fair Sitting balance - Comments: difficulty using Rt UE for support   Standing balance support: Bilateral upper extremity supported;During functional activity Standing balance-Leahy Scale: Zero Standing balance comment: Max assist required at times.                     Cognition Arousal/Alertness: Awake/alert Behavior During Therapy: WFL for tasks assessed/performed Overall Cognitive Status: No family/caregiver present to determine baseline cognitive functioning       Memory: Decreased short-term memory              Exercises      General Comments        Pertinent Vitals/Pain Pain Assessment: Faces Faces Pain Scale: Hurts little more Pain Location:  B knees after standing/ambulation attempts. Pain Descriptors / Indicators: Discomfort;Sore Pain Intervention(s): Limited activity within patient's tolerance;Monitored during session;Repositioned    Home Living                       Prior Function            PT Goals (current goals can now be found in the care plan section) Acute Rehab PT Goals Patient Stated Goal: go home PT Goal Formulation: Patient unable to participate in goal setting Time For Goal Achievement: 03/17/16 Potential to Achieve Goals: Fair Progress towards PT goals: Progressing toward goals    Frequency  Min 4X/week    PT Plan Current plan remains appropriate    Co-evaluation             End of Session Equipment Utilized During Treatment: Gait belt Activity Tolerance: Patient tolerated treatment well Patient left: in chair;with call bell/phone within reach;with chair alarm set     Time: 1147-1208 PT Time Calculation (min) (ACUTE ONLY): 21 min  Charges:  $Gait Training: 8-22 mins                    G Codes:      Rolinda Roan March 19, 2016, 2:28 PM  Rolinda Roan, PT, DPT Acute Rehabilitation Services Pager: [phone]

## 2016-03-11 NOTE — Progress Notes (Signed)
STROKE TEAM PROGRESS NOTE   HISTORY OF PRESENT ILLNESS (per record) Dustin [name] is an 82 y.o. male history of atrial fibrillation on Pradaxa, hypertension, hyperlipidemia, stroke, diabetes mellitus and dementia, who was brought to the ED at [facility] with a history of recurrent falls and right-sided weakness. Exact onset is unclear. He was last known well on 03/07/2016. MRI showed multiple acute nonhemorrhagic infarction involving the left lentiform nucleus/corona radiata. NIH stroke score was 6. Patient was not administered IV t-PA secondary to  beyond time under for treatment consideration. He was admitted for further evaluation and treatment.   SUBJECTIVE (INTERVAL HISTORY) No family members present. The patient voices no new complaints. He is waiting to go to snf for rehab   OBJECTIVE Temp:  [97.6 F (36.4 C)-99.1 F (37.3 C)] 98 F (36.7 C) (06/04 0934) Pulse Rate:  [63-70] 68 (06/04 0934) Cardiac Rhythm:  [-] Atrial fibrillation (06/03 2029) Resp:  [18-20] 20 (06/04 0934) BP: (113-134)/(57-77) 113/67 mmHg (06/04 0934) SpO2:  [91 %-99 %] 91 % (06/04 0934)  CBC:   Recent Labs Lab 03/08/16 1953 03/08/16 2032  WBC  --  8.8  NEUTROABS  --  6.3  HGB 12.9* 12.7*  HCT 38.0* 36.7*  MCV  --  95.1  PLT  --  123XX123    Basic Metabolic Panel:   Recent Labs Lab 03/08/16 1953 03/08/16 2032  NA 140 138  K 3.8 3.8  CL 99* 101  CO2  --  28  GLUCOSE 136* 139*  BUN 33* 31*  CREATININE 1.20 1.09  CALCIUM  --  9.6    Lipid Panel:     Component Value Date/Time   CHOL 163 03/09/2016 0709   TRIG 81 03/09/2016 0709   HDL 57 03/09/2016 0709   CHOLHDL 2.9 03/09/2016 0709   VLDL 16 03/09/2016 0709   LDLCALC 90 03/09/2016 0709   HgbA1c:  Lab Results  Component Value Date   HGBA1C 6.6* 03/09/2016   Urine Drug Screen: No results found for: LABOPIA, COCAINSCRNUR, LABBENZ, AMPHETMU, THCU, LABBARB    IMAGING  Dg Chest 2 View 03/08/2016   No acute cardiopulmonary  process seen.     Ct Head Wo Contrast 03/08/2016   No acute intracranial abnormality.  Atrophy, chronic microvascular disease.  Remote left basal ganglia lacunar infarct.      Ct Cervical Spine Wo Contrast 03/08/2016   No evidence of acute traumatic injury to the cervical spine.  Multilevel osteoarthritic changes of the cervical spine.     Mr Brain Wo Contrast 03/08/2016   1. Small acute ischemic nonhemorrhagic infarct involving the left lentiform nucleus/corona radiata.   No associated mass effect.  2. Remote lacunar infarcts involving the left basal ganglia and right thalamus.  3. Multiple small chronic micro hemorrhages involving the deep gray nuclei, brainstem, and cerebellum, most likely related to  chronic underlying hypertension.  4. Advanced cerebral atrophy with moderate chronic small vessel ischemic disease.  5. Left mastoid effusion.    Transcranial Dopplers 03/09/2016 Low mean flow velocities throughout anterior and posterior cerebral circulations due to suboptimal waveforms from poor occipital windows.Globally elevated pulsatility indices suggest diffuse intracranial atherosclerosis.    PHYSICAL EXAM Pleasant elderly Caucasian male. He is restless and wants to go home. . Afebrile. Head is nontraumatic. Neck is supple without bruit.    Cardiac exam no murmur or gallop. Lungs are clear to auscultation. Distal pulses are well felt. Neurological Exam :  Awake alert oriented to place and person only. Diminished  attention, registration and recall. Poor insight into his condition. Follows one and two-step commands only. No aphasia, apraxia dysarthria. Pupils equal reactive. Fundi were not visualized. Vision acuity and fields seem adequate. Face is symmetric without weakness. Tongue midline. Motor system exam no upper extremity drift mild right lower extremity drift. Mild weakness of right grip and intrinsic hand muscles and right hip flexors. Sensation diminished on the  right hemibody compared to the left. Plantars downgoing. Coordination slow but accurate on the normal on the left. Gait was not tested.    ASSESSMENT/PLAN Mr. Jahel Leleux is a 82 y.o. male with history of atrial fibrillation on Pradaxa, hypertension, hyperlipidemia, stroke, diabetes mellitus and dementia presenting with recurrent falls and R sided weakness. He did not receive IV t-PA due to delay in arrival and anticoagulation.  Stroke:  Dominant left lentiform nucleus/corona radiata infarct secondary to small vessel disease but contribution from atrial fibrillation with suboptimal anticoagulation cannot be ruled out  Resultant right hemiparesis .baseline mild dementia  MRI   left lentiform nucleus/corona radiata infarct. Old L basal ganglia and R thalamic lacunes. small vessel disease.   TCd - See above  Carotid Doppler - 1-39% internal carotid artery stenosis bilaterally. Vertebral arteries are patent with antegrade flow. 2D Echo - Left ventricle: The cavity size was normal. There was mild  concentric hypertrophy. Systolic function was normal. The  estimated ejection fraction was in the range of 60% to 65%. Wall  motion was normal; there were no regional wall motion  abnormalities.  - Aortic valve: Moderate diffuse thickening and calcification  LDL 90  HgbA1c - 6.6  Pradaxa for VTE prophylaxis DIET SOFT Room service appropriate?: Yes; Fluid consistency:: Thin  Pradaxa (dabigatran) twice a day prior to admission, now on Pradaxa (dabigatran) twice a day  Patient counseled to be compliant with his antithrombotic medications  Ongoing aggressive stroke risk factor management  Therapy recommendations:  pending   Disposition:  pending  (lives with wife and daughter)  Hypertension  Stable  Permissive hypertension (OK if < 220/120) but gradually normalize in 5-7 days  Hyperlipidemia  Home meds:  mevacor 69, changed to pravachol 40 in hospital  LDL 90, goal <  70  Continue statin at discharge  Diabetes  HgbA1c 6.6, goal < 7.0  Other Stroke Risk Factors  Advanced age  Former Cigarette smoker  Hx stroke/TIA  Unknown details several years ago  Family hx stroke (father)  Other Active Problems  Baseline dementia  Recently off anticoagulation for dental extraction  Hospital day # 2     Continue Pradaxa for stroke prevention for atrial fibrillation and maintain aggressive risk factor modification with tight control of hypertension with blood pressure goal below 130/90, diabetes with hemoglobin A1c goal below 7 and lipids with LDL cholesterol goal below 70 mg percent. Follow-up as an outpatient in stroke clinic in 2 months. Stroke team will sign off. Kindly call for questions. Antony Contras, MD Medical Director [facility] Pager: [phone] 03/11/2016 12:48 PM     To contact Stroke Continuity provider, please refer to [email or web address] After hours, contact General Neurology

## 2016-03-11 NOTE — Progress Notes (Signed)
[facility] PROGRESS NOTE  Dustin [name] [id number] DOB: 06/28/1927 DOA: 03/08/2016 PCP: Osborne Casco, MD  Interim summary and HPI 82 y.o. male history of atrial fibrillation on Pradaxa, hypertension, hyperlipidemia, review stroke, diabetes mellitus and dementia, who was brought to the ED at [facility] with a history of recurrent falls and right-sided weakness. Exact onset is unclear. He was last known well on 03/07/2016. MRI showed multiple acute nonhemorrhagic infarction involving the left lentiform nucleus/corona radiata. NIH stroke score was 6.  Pradaxa for secondary prevention. CIR consult requested. Follow neurology rec's  Assessment/Plan: 1-Dominant left lentiform nucleus/corona radiata stroke -had hx of A. Fib -2-D echo: w/o acute source for emboli appreciated. Preserved EF and no WMA -secondary prevention with pradaxa -concerns of potential medication compliance issues  -per PT will benefit of CIR for rehab -will follow neurology rec's  2- HTN -stable currently -allowing permissive HTN while in acute ischemic setting   3-HLD: -continue statins -LDL 90  4-diabetes type 2 -A1C 6.6 -continue SSI while inpatient  5-depression/anxiety -continue zoloft  6-glaucoma: Continue alphagan   7-dementia: -continue supportive care -PRN haldol for agitation   Code Status: DNR Family Communication: no family at bedside  Disposition Plan: will need CIR or SNF for rehab. Will follow rec's. Stroke work up completed   Consultants:  Neurology   Procedures:  MRI left lentiform nucleus/corona radiata infarct. Old L basal ganglia and R thalamic lacunes. small vessel disease.  TCd - Low mean flow velocities throughout anterior and posterior cerebral circulations due to suboptimal waveforms from poor occipital windows.Globally elevated pulsatility indices suggest diffuse intracranial atherosclerosis.     Carotid Doppler - 1-39% internal  carotid artery stenosis bilaterally. Vertebral arteries are patent with antegrade flow.  2D Echo -  - Left ventricle: The cavity size was normal. There was mild  concentric hypertrophy. Systolic function was normal. The  estimated ejection fraction was in the range of 60% to 65%. Wall  motion was normal; there were no regional wall motion  abnormalities. - Aortic valve: Moderate diffuse thickening and calcification. - Left atrium: The atrium was mildly dilated.  Antibiotics:  None   HPI/Subjective: No aphasia or dysarthria. Patient with positive right hemiparesis. Pleasantly confused. Able to follow commands.   Objective: Filed Vitals:   03/11/16 0934 03/11/16 1422  BP: 113/67 105/89  Pulse: 68 89  Temp: 98 F (36.7 C) 98.6 F (37 C)  Resp: 20 20    Intake/Output Summary (Last 24 hours) at 03/11/16 1547 Last data filed at 03/10/16 2053  Gross per 24 hour  Intake    240 ml  Output    500 ml  Net   -260 ml   Filed Weights   03/09/16 0300  Weight: 81.6 kg (179 lb 14.3 oz)    Exam:   General:  Afebrile, no CP, oriented X2 (with some intermittent confusion and confabulation); had hx of dementia at baseline. Right hemiparesis on present on exam. No dysarthria   Cardiovascular: rate controlled, no rubs or gallops  Respiratory: no wheezing or crackles, good air movement   Abdomen: soft, NT, ND, positive BS  Musculoskeletal: no cyanosis or clubbing   Neurologic exam: no apraxia, no aphasia or dysarthria; PERRLA, MS 4/5 on left side and 3/5 on right side upper and lower extremities). Gait not tested   Data Reviewed: Basic Metabolic Panel:  Recent Labs Lab 03/08/16 1953 03/08/16 2032  NA 140 138  K 3.8 3.8  CL 99* 101  CO2  --  28  GLUCOSE 136* 139*  BUN 33* 31*  CREATININE 1.20 1.09  CALCIUM  --  9.6   Liver Function Tests:  Recent Labs Lab 03/08/16 2032  AST 37  ALT 29  ALKPHOS 51  BILITOT 4.1*  PROT 7.1  ALBUMIN 4.6   CBC:  Recent  Labs Lab 03/08/16 1953 03/08/16 2032  WBC  --  8.8  NEUTROABS  --  6.3  HGB 12.9* 12.7*  HCT 38.0* 36.7*  MCV  --  95.1  PLT  --  187    CBG:  Recent Labs Lab 03/10/16 0624 03/10/16 1619 03/10/16 2108 03/11/16 0657 03/11/16 1104  GLUCAP 152* 156* 121* 135* 143*    Recent Results (from the past 240 hour(s))  Urine culture     Status: None   Collection Time: 03/08/16  6:31 PM  Result Value Ref Range Status   Specimen Description URINE, CLEAN CATCH  Final   Special Requests NONE  Final   Culture NO GROWTH Performed at [facility]   Final   Report Status 03/10/2016 FINAL  Final     Studies: No results found.  Scheduled Meds: .  stroke: mapping our early stages of recovery book   Does not apply Once  . amoxicillin  500 mg Oral QID  . brimonidine  1 drop Both Eyes BID  . dabigatran  150 mg Oral BID  . irbesartan  75 mg Oral Daily   And  . hydrochlorothiazide  12.5 mg Oral Daily  . insulin aspart  0-8 Units Subcutaneous TID WC  . metFORMIN  500 mg Oral Q breakfast  . pravastatin  40 mg Oral q1800  . sertraline  25 mg Oral Daily   Continuous Infusions:   Principal Problem:   Acute CVA (cerebrovascular accident) ([place]) Active Problems:   Dementia   Physical debility   Essential hypertension   Diabetes mellitus type 2, noninsulin dependent ([place])   Chronic atrial fibrillation ([place])   DNR (do not resuscitate)   Falls   Right leg weakness   Cerebral infarction due to unspecified mechanism    Time spent: 30 minutes    Barton Dubois  [facility] Pager [phone]. If 7PM-7AM, please contact night-coverage at [email or web address], password [facility] 03/11/2016, 3:47 PM  LOS: 2 days

## 2016-03-12 DIAGNOSIS — I10 Essential (primary) hypertension: Secondary | ICD-10-CM

## 2016-03-12 DIAGNOSIS — I480 Paroxysmal atrial fibrillation: Secondary | ICD-10-CM

## 2016-03-12 DIAGNOSIS — W19XXXD Unspecified fall, subsequent encounter: Secondary | ICD-10-CM

## 2016-03-12 DIAGNOSIS — I639 Cerebral infarction, unspecified: Principal | ICD-10-CM

## 2016-03-12 DIAGNOSIS — I482 Chronic atrial fibrillation: Secondary | ICD-10-CM

## 2016-03-12 DIAGNOSIS — E119 Type 2 diabetes mellitus without complications: Secondary | ICD-10-CM

## 2016-03-12 DIAGNOSIS — Z8673 Personal history of transient ischemic attack (TIA), and cerebral infarction without residual deficits: Secondary | ICD-10-CM

## 2016-03-12 DIAGNOSIS — D649 Anemia, unspecified: Secondary | ICD-10-CM

## 2016-03-12 DIAGNOSIS — F0390 Unspecified dementia without behavioral disturbance: Secondary | ICD-10-CM

## 2016-03-12 LAB — GLUCOSE, CAPILLARY
GLUCOSE-CAPILLARY: 127 mg/dL — AB (ref 65–99)
Glucose-Capillary: 115 mg/dL — ABNORMAL HIGH (ref 65–99)
Glucose-Capillary: 181 mg/dL — ABNORMAL HIGH (ref 65–99)
Glucose-Capillary: 149 mg/dL — ABNORMAL HIGH (ref 65–99)

## 2016-03-12 NOTE — Evaluation (Addendum)
Occupational Therapy Evaluation Patient Details Name: Dustin [name] MRN: [id number] DOB: 04/08/1927 Today's Date: 03/12/2016    History of Present Illness Pt is an 82 y.o. male with hx of afib, HLD, dementia, HTN, DM2 and CVA 20 yrs ago who presented to ED with multiple falls and unable to stand on R leg. MRI showed acute L corona radiata/lentiform nucleus and also remote lacunar infarcts L basal ganglia and R thalamus, multiple small microbleeds. PMH includes h/o ETOH use, Hx of prostate cancer rx w surgery   Clinical Impression   Pt admitted with above. Unsure of pt's true PLOF. Feel pt will benefit from acute OT to increase independence prior to d/c. Recommending CIR for rehab.     Follow Up Recommendations  CIR;Supervision/Assistance - 24 hour    Equipment Recommendations  Other (comment) (defer to next venue)    Recommendations for Other Services       Precautions / Restrictions Precautions Precautions: Fall Precaution Comments: multiple falls within past year Restrictions Weight Bearing Restrictions: No      Mobility Bed Mobility Overal bed mobility: Needs Assistance Bed Mobility: Supine to Sit     Supine to sit: Mod assist        Transfers Overall transfer level: Needs assistance Equipment used: Rolling walker (2 wheeled) Transfers: Sit to/from Stand Sit to Stand: +2 physical assistance;Mod assist         General transfer comment: assist with hand placement and positioning of Rt foot as well as assist to boost to stand.    Balance      Min-Mod Assist for standing balance at sink. +2 Mod A for ambulation.                                       ADL Overall ADL's : Needs assistance/impaired     Grooming: Oral care;Wash/dry face;Standing (Min-Mod A for balance at sink)           Upper Body Dressing : Moderate assistance;Sitting   Lower Body Dressing: +2 for physical assistance;Moderate assistance;Sit to/from stand   Toilet  Transfer: +2 for physical assistance;Ambulation;RW;Maximal assistance (sit to stand from bed and chair)           Functional mobility during ADLs: +2 for physical assistance;Rolling walker;Maximal assistance       Vision     Perception     Praxis      Pertinent Vitals/Pain Pain Assessment: Faces Faces Pain Scale: Hurts a little bit Pain Location: bottom Pain Descriptors / Indicators: Sore Pain Intervention(s): Monitored during session;Repositioned     Hand Dominance     Extremity/Trunk Assessment Upper Extremity Assessment Upper Extremity Assessment: RUE deficits/detail;LUE deficits/detail RUE Deficits / Details: decreased AROM shoulder flexion; weakness in shoulder flexors RUE Sensation: decreased light touch RUE Coordination: decreased gross motor (difficult to assess due to cognition) LUE Deficits / Details: weakness in shoulder flexors   Lower Extremity Assessment Lower Extremity Assessment: Defer to PT evaluation       Communication Communication Communication: No difficulties   Cognition Arousal/Alertness: Awake/alert Behavior During Therapy: WFL for tasks assessed/performed Overall Cognitive Status: No family/caregiver present to determine baseline cognitive functioning (history of dementia)                     General Comments       Exercises       Shoulder Instructions      Home  Living Family/patient expects to be discharged to:: Unsure Living Arrangements: Spouse/significant other Available Help at Discharge: Family;Available 24 hours/day Type of Home: House Home Access: Level entry (via garage)     Home Layout: One level                      Lives With: Spouse    Prior Functioning/Environment Level of Independence: Independent        Comments: per pt    OT Diagnosis: Generalized weakness   OT Problem List: Decreased strength;Decreased range of motion;Decreased activity tolerance;Impaired balance (sitting and/or  standing);Decreased cognition;Decreased knowledge of use of DME or AE;Impaired sensation;Decreased knowledge of precautions;Decreased safety awareness   OT Treatment/Interventions: Self-care/ADL training;Therapeutic exercise;DME and/or AE instruction;Therapeutic activities;Cognitive remediation/compensation;Balance training;Patient/family education    OT Goals(Current goals can be found in the care plan section) Acute Rehab OT Goals Patient Stated Goal: not stated OT Goal Formulation: With patient Time For Goal Achievement: 03/19/16 Potential to Achieve Goals: Good  OT Frequency: Min 2X/week   Barriers to D/C:            Co-evaluation PT/OT/SLP Co-Evaluation/Treatment: Yes Reason for Co-Treatment: For patient/therapist safety   OT goals addressed during session: ADL's and self-care;Other (comment) (mobility)      End of Session Equipment Utilized During Treatment: Gait belt;Rolling walker  Activity Tolerance: Patient limited by fatigue Patient left: in chair;with call bell/phone within reach;with chair alarm set;with nursing/sitter in room   Time: [id number] OT Time Calculation (min): 19 min Charges:  OT General Charges $OT Visit: 1 Procedure OT Evaluation $OT Eval Moderate Complexity: 1 Procedure G-CodesBenito Mccreedy OTR/L [id number] 03/12/2016, 1:41 PM

## 2016-03-12 NOTE — Clinical Social Work Note (Signed)
Clinical Social Work Assessment  Patient Details  Name: Dustin [name] MRN: [id number] Date of Birth: 1927/02/13  Date of referral:  03/12/16               Reason for consult:  Facility Placement                Permission sought to share information with:  Facility Sport and exercise psychologist, Family Supports Permission granted to share information::  Yes, Verbal Permission Granted  Name::     Lysle Rubens Daughter [phone]  Agency::  SNF admissions  Relationship::     Contact Information:     Housing/Transportation Living arrangements for the past 2 months:  Single Family Home Source of Information:  Patient Patient Interpreter Needed:  None Criminal Activity/Legal Involvement Pertinent to Current Situation/Hospitalization:  No - Comment as needed Significant Relationships:  Adult Children Lives with:  Self Do you feel safe going back to the place where you live?  No Need for family participation in patient care:  No (Coment)  Care giving concerns: Patient needs some rehab before he can return back home.  Social Worker assessment / plan:  Patient is a widowed 82 year old male who is alert and oriented x4.  Patient expresses that he lives in a house, patient states he has been to SNF in the past before and is familiar of what to expect at SNF.  Patient was explained role of CSW to help patient understand if he does not get accepted to CIR then SNF will be the back up plan or go home with home health.  Patient expressed that is okay for CSW to begin bed search process and to fax information to [facility].  Patient did not express any other questions, CSW explained that his insurance should pay for stay at SNF if he is not approved to inpatient rehab.  Patient expressed he does not want to go to [facility] again, since he was there before. Employment status:  Retired Nurse, adult PT Recommendations:  [place] /  Referral to community resources:  [place]  Patient/Family's Response to care:  Patient in agreement to going to SNF for short term rehab, if he is not accepted to CIR.  Patient/Family's Understanding of and Emotional Response to Diagnosis, Current Treatment, and Prognosis:  Patient aware of current treatment plan an diagnosis.  Emotional Assessment Appearance:  Appears stated age Attitude/Demeanor/Rapport:    Affect (typically observed):  Appropriate, Calm, Stable Orientation:  Oriented to  Time, Oriented to Place, Oriented to Self, Oriented to Situation Alcohol / Substance use:  Not Applicable Psych involvement (Current and /or in the community):  No (Comment)  Discharge Needs  Concerns to be addressed:  Lack of Support Readmission within the last 30 days:  No Current discharge risk:  Lack of support system, Lives alone Barriers to Discharge:  Insurance Authorization   Anell Barr 03/12/2016, 6:26 PM

## 2016-03-12 NOTE — Progress Notes (Signed)
Physical Therapy Treatment Patient Details Name: Dustin [name] MRN: [id number] DOB: Feb 26, 1927 Today's Date: 03/12/2016    History of Present Illness Pt is an 82 y.o. male with hx of afib, HLD, dementia, HTN, DM2 and CVA 20 yrs ago who presented to ED with multiple falls and unable to stand on R leg. MRI showed acute L corona radiata/lentiform nucleus and also remote lacunar infarcts L basal ganglia and R thalamus, multiple small microbleeds. PMH includes h/o ETOH use, Hx of prostate cancer rx w surgery    PT Comments    Pt able to tolerate increased ambulation distance this date but remains to have R sided weakness, significant impaired balance, increased falls risk, and requires increased assist for all transfers and ambulation. Con't to recommend CIR upon d/c.  Follow Up Recommendations  CIR     Equipment Recommendations  Rolling walker with 5" wheels;3in1 (PT);Wheelchair (measurements PT)    Recommendations for Other Services Rehab consult     Precautions / Restrictions Precautions Precautions: Fall Restrictions Weight Bearing Restrictions: No    Mobility  Bed Mobility Overal bed mobility: Needs Assistance Bed Mobility: Supine to Sit     Supine to sit: Mod assist     General bed mobility comments: max directional v/c's, assist for trunk elevation and to bring hips to EOB  Transfers Overall transfer level: Needs assistance Equipment used: Rolling walker (2 wheeled) Transfers: Sit to/from Stand Sit to Stand: +2 physical assistance;Mod assist         General transfer comment: v/c's for safe hand placement, modA to achieve full upright trunk and step into walker  Ambulation/Gait Ambulation/Gait assistance: +2 physical assistance;+2 safety/equipment Ambulation Distance (Feet): 30 Feet Assistive device: Rolling walker (2 wheeled) Gait Pattern/deviations: Step-to pattern;Decreased stride length;Decreased stance time - right;Decreased step length -  right;Shuffle;Staggering right;Narrow base of support Gait velocity: decreased Gait velocity interpretation: Below normal speed for age/gender General Gait Details: progressive lean to the right, OT assist with weight shift to the left, PT on hips for tacilte cues of midline and when to advance R LE. pt with minimal foot clearance on R, with onset of fatigue pt strong progressive R lateral lean   Stairs            Wheelchair Mobility    Modified Rankin (Stroke Patients Only) Modified Rankin (Stroke Patients Only) Pre-Morbid Rankin Score: Slight disability Modified Rankin: Moderately severe disability     Balance Overall balance assessment: Needs assistance Sitting-balance support: Feet supported Sitting balance-Leahy Scale: Fair Sitting balance - Comments: pt able to maintain midline while sitting EOB without external support   Standing balance support: Bilateral upper extremity supported Standing balance-Leahy Scale: Poor Standing balance comment: pt stood at sink with OT to brush teeth, pt with progressive R lateral lean with onset of faituge, proped self up on sink, PT to provide support at hips                    Cognition Arousal/Alertness: Awake/alert Behavior During Therapy: WFL for tasks assessed/performed Overall Cognitive Status: No family/caregiver present to determine baseline cognitive functioning       Memory: Decreased short-term memory              Exercises      General Comments        Pertinent Vitals/Pain      Home Living                      Prior Function  PT Goals (current goals can now be found in the care plan section) Acute Rehab PT Goals Patient Stated Goal: none stated Progress towards PT goals: Progressing toward goals    Frequency  Min 4X/week    PT Plan Current plan remains appropriate    Co-evaluation PT/OT/SLP Co-Evaluation/Treatment: Yes Reason for Co-Treatment: Complexity of the  patient's impairments (multi-system involvement) PT goals addressed during session: Mobility/safety with mobility       End of Session Equipment Utilized During Treatment: Gait belt Activity Tolerance: Patient tolerated treatment well Patient left: in chair;with call bell/phone within reach;with chair alarm set     Time: [id number] PT Time Calculation (min) (ACUTE ONLY): 25 min  Charges:  $Gait Training: 8-22 mins                    G Codes:      Kingsley Callander 03/12/2016, 4:27 PM   Kittie Plater, PT, DPT Pager #: [phone] Office #: [phone]

## 2016-03-12 NOTE — Progress Notes (Signed)
[facility] PROGRESS NOTE  Willliam [name] [id number] DOB: 1927-01-07 DOA: 03/08/2016 PCP: Osborne Casco, MD  Interim summary and HPI 82 y.o. male history of atrial fibrillation on Pradaxa, hypertension, hyperlipidemia, review stroke, diabetes mellitus and dementia, who was brought to the ED at [facility] with a history of recurrent falls and right-sided weakness. Exact onset is unclear. He was last known well on 03/07/2016. MRI showed multiple acute nonhemorrhagic infarction involving the left lentiform nucleus/corona radiata. NIH stroke score was 6.  Complete work up; pradaxa for secondary prevention. CIR consult requested will follow up rec's  Assessment/Plan: 1-Dominant left lentiform nucleus/corona radiata stroke -had hx of A. Fib -2-D echo w/o acute source of emboli; preserved EF. -secondary prevention with pradaxa -concerns of potential medication compliance issues  -per PT will benefit of CIR for rehab. Final rec's pending   2- HTN -stable currently -allowing permissive HTN while in acute ischemic setting   3-HLD: -continue statins -LDL 90  4-diabetes type 2 -A1C 6.6 -continue SSI while inpatient  5-depression/anxiety -continue zoloft  6-glaucoma: Continue alphagan   7-dementia: -continue supportive care -PRN haldol for agitation   Code Status: DNR Family Communication: no family at bedside  Disposition Plan: will need CIR or SNF for rehab. Will follow rec's.   Consultants:  Neurology   Procedures:  MRI left lentiform nucleus/corona radiata infarct. Old L basal ganglia and R thalamic lacunes. small vessel disease.  TCd - Low mean flow velocities throughout anterior and posterior cerebral circulations due to suboptimal waveforms from poor occipital windows.Globally elevated pulsatility indices suggest diffuse intracranial atherosclerosis.   Carotid Doppler - 1-39% internal carotid artery stenosis bilaterally. Vertebral  arteries are patent with antegrade flow.  2D Echo -  - Left ventricle: The cavity size was normal. There was mild  concentric hypertrophy. Systolic function was normal. The  estimated ejection fraction was in the range of 60% to 65%. Wall  motion was normal; there were no regional wall motion  abnormalities. - Aortic valve: Moderate diffuse thickening and calcification. - Left atrium: The atrium was mildly dilated.  Antibiotics:  None   HPI/Subjective: Pleasantly confused, in no acute distress. Denies CP and SOB. No aphasia or dysarthria. Patient with positive right hemiparesis on exam; unchanged. Even he feel slightly stronger.  Objective: Filed Vitals:   03/12/16 1348 03/12/16 1741  BP: 118/69 114/58  Pulse: 53 67  Temp: 98.1 F (36.7 C) 97.8 F (36.6 C)  Resp: 20 20    Intake/Output Summary (Last 24 hours) at 03/12/16 1802 Last data filed at 03/12/16 1700  Gross per 24 hour  Intake   1140 ml  Output   1100 ml  Net     40 ml   Filed Weights   03/09/16 0300  Weight: 81.6 kg (179 lb 14.3 oz)    Exam:   General:  Afebrile, no CP, oriented X2 (with some intermittent confusion and confabulation); had hx of dementia at baseline. Right hemiparesis on present on exam. No dysarthria or apraxia. Denies SOB.  Cardiovascular: rate controlled, no rubs or gallops  Respiratory: no wheezing or crackles, good air movement   Abdomen: soft, NT, ND, positive BS  Musculoskeletal: no cyanosis or clubbing   Neurologic exam: no apraxia, no aphasia or dysarthria; PERRLA, MS 4/5 on left side and 3/5 on right side upper and lower extremities). Gait not tested   Data Reviewed: Basic Metabolic Panel:  Recent Labs Lab 03/08/16 1953 03/08/16 2032  NA 140 138  K 3.8 3.8  CL  99* 101  CO2  --  28  GLUCOSE 136* 139*  BUN 33* 31*  CREATININE 1.20 1.09  CALCIUM  --  9.6   Liver Function Tests:  Recent Labs Lab 03/08/16 2032  AST 37  ALT 29  ALKPHOS 51  BILITOT 4.1*   PROT 7.1  ALBUMIN 4.6   CBC:  Recent Labs Lab 03/08/16 1953 03/08/16 2032  WBC  --  8.8  NEUTROABS  --  6.3  HGB 12.9* 12.7*  HCT 38.0* 36.7*  MCV  --  95.1  PLT  --  187    CBG:  Recent Labs Lab 03/11/16 1617 03/11/16 2134 03/12/16 0622 03/12/16 1111 03/12/16 1627  GLUCAP 210* 107* 115* 181* 127*    Recent Results (from the past 240 hour(s))  Urine culture     Status: None   Collection Time: 03/08/16  6:31 PM  Result Value Ref Range Status   Specimen Description URINE, CLEAN CATCH  Final   Special Requests NONE  Final   Culture NO GROWTH Performed at [facility]   Final   Report Status 03/10/2016 FINAL  Final     Studies: No results found.  Scheduled Meds: .  stroke: mapping our early stages of recovery book   Does not apply Once  . amoxicillin  500 mg Oral QID  . brimonidine  1 drop Both Eyes BID  . dabigatran  150 mg Oral BID  . irbesartan  75 mg Oral Daily   And  . hydrochlorothiazide  12.5 mg Oral Daily  . insulin aspart  0-8 Units Subcutaneous TID WC  . metFORMIN  500 mg Oral Q breakfast  . pravastatin  40 mg Oral q1800  . sertraline  25 mg Oral Daily   Continuous Infusions:   Principal Problem:   Acute CVA (cerebrovascular accident) ([place]) Active Problems:   Dementia   Physical debility   Essential hypertension   Diabetes mellitus type 2, noninsulin dependent ([place])   Chronic atrial fibrillation ([place])   DNR (do not resuscitate)   Falls   Right leg weakness   Cerebral infarction due to unspecified mechanism   History of CVA (cerebrovascular accident)   PAF (paroxysmal atrial fibrillation) ([place])   Normocytic anemia    Time spent: 30 minutes    Barton Dubois  [facility] Pager [phone]. If 7PM-7AM, please contact night-coverage at [email or web address], password [facility] 03/12/2016, 6:02 PM  LOS: 3 days

## 2016-03-12 NOTE — Progress Notes (Signed)
Inpatient Rehabilitation  Admissions coordinator will follow up with pt. tomorrow.    [place] Admissions Coordinator Cell [phone] Office [phone]

## 2016-03-12 NOTE — Clinical Social Work Placement (Signed)
   CLINICAL SOCIAL WORK PLACEMENT  NOTE  Date:  03/12/2016  Patient Details  Name: Boluwatife Gavette MRN: [id number] Date of Birth: October 06, 1927  Clinical Social Work is seeking post-discharge placement for this patient at the [place] level of care (*CSW will initial, date and re-position this form in  chart as items are completed):  Yes   Patient/family provided with [place] Work Department's list of facilities offering this level of care within the geographic area requested by the patient (or if unable, by the patient's family).  Yes   Patient/family informed of their freedom to choose among providers that offer the needed level of care, that participate in Medicare, Medicaid or managed care program needed by the patient, have an available bed and are willing to accept the patient.  Yes   Patient/family informed of [facility]'s ownership interest in [facility] and [facility], as well as of the fact that they are under no obligation to receive care at these facilities.  PASRR submitted to EDS on       PASRR number received on       Existing PASRR number confirmed on       FL2 transmitted to all facilities in geographic area requested by pt/family on       FL2 transmitted to all facilities within larger geographic area on       Patient informed that his/her managed care company has contracts with or will negotiate with certain facilities, including the following:            Patient/family informed of bed offers received.  Patient chooses bed at       Physician recommends and patient chooses bed at      Patient to be transferred to   on  .  Patient to be transferred to facility by       Patient family notified on   of transfer.  Name of family member notified:        PHYSICIAN Please sign FL2     Additional Comment:    _______________________________________________ Ross Ludwig, LCSWA 03/12/2016, 6:32 PM

## 2016-03-12 NOTE — Care Management Important Message (Signed)
Important Message  Patient Details  Name: Dustin [name] MRN: [id number] Date of Birth: 1927/08/15   Medicare Important Message Given:  Yes    Loann Quill 03/12/2016, 11:58 AM

## 2016-03-12 NOTE — Progress Notes (Signed)
Patient's daughter Henrietta Dine) is on vacation with a six hour time difference. She wants patient to go to CIR and is concerned she won't be able to make quick decisions about a SNF if it comes to that. I told her we would keep her updated and wouldn't make any decisions without family input. Shacoria Latif, Rande Brunt, RN

## 2016-03-12 NOTE — Consult Note (Signed)
Physical Medicine and Rehabilitation Consult Reason for Consult: Left lentiform nuclear sclerosing radiata infarct Referring Physician: [facility]   HPI: Dustin [name] is a 82 y.o. right handed male dementia, hypertension, diabetes mellitus, CVA 20 years ago, atrial fibrillation maintained on Pradaxa. Per chart review patient lives with wife. One level home. Reported to be independent prior to admission. Wife with limited physical assistance. Presented 03/09/2016 with multiple falls right side weakness. MRI of the brain showed small acute ischemic nonhemorrhagic infarct involving the left lentiform nucleus corona radiata. Remote lacunar infarcts involving the left basal ganglia and right thalamus. Advanced cerebral atrophy. Echocardiogram with ejection fraction of 65% no wall motion abnormalities. Carotid Dopplers with no ICA stenosis. Patient did not receive TPA. Neurology consulted patient presently remains on Pradaxa. Patient is on mechanical soft diet. Physical therapy evaluation completed 03/10/2016 with recommendations of physical medicine rehabilitation consult.   Review of Systems  Constitutional: Negative for fever and chills.  HENT: Negative for hearing loss.   Eyes: Negative for blurred vision and double vision.  Respiratory: Negative for cough and shortness of breath.   Cardiovascular: Negative for chest pain and leg swelling.  Gastrointestinal: Positive for constipation. Negative for nausea and vomiting.  Genitourinary: Positive for urgency. Negative for dysuria and hematuria.  Musculoskeletal: Positive for myalgias.  Skin: Negative for rash.  Neurological: Positive for weakness. Negative for seizures and headaches.  Psychiatric/Behavioral: Positive for memory loss.  All other systems reviewed and are negative.  Past Medical History  Diagnosis Date  . Pancreatitis   . A-fib ([place])   . Cancer ([place]) 2012    skin cancers  . Prostate cancer ([place])   . Dementia   . Stroke  ([place])   . Hypertension   . Diabetes mellitus without complication ([place])   . Hyperlipidemia   . Poor balance   . Bilateral knee pain   . Arthritis    Past Surgical History  Procedure Laterality Date  . Eye surgery    . Appendectomy    . Cholecystectomy    . Pancreas surgery     Family History  Problem Relation Age of Onset  . Heart attack Neg Hx   . Stroke Father   . Diabetes Mellitus II Sister   . Prostate cancer Neg Hx   . Colon cancer Father    Social History:  reports that he has quit smoking. He has never used smokeless tobacco. He reports that he does not drink alcohol or use illicit drugs. Allergies: No Known Allergies Medications Prior to Admission  Medication Sig Dispense Refill  . amoxicillin (AMOXIL) 500 MG tablet Take 500 mg by mouth 4 (four) times daily. ABT Start Date 03/07/16 & End Date 03/17/16.    . brimonidine (ALPHAGAN P) 0.1 % SOLN Place 1 drop into both eyes 2 (two) times daily.     . dabigatran (PRADAXA) 150 MG CAPS capsule Take 150 mg by mouth 2 (two) times daily.    [name] lovastatin (MEVACOR) 40 MG tablet Take 40 mg by mouth daily.     . metFORMIN (GLUCOPHAGE) 500 MG tablet Take 500 mg by mouth daily.    . sertraline (ZOLOFT) 25 MG tablet Take 1 tablet by mouth daily.  3  . valsartan-hydrochlorothiazide (DIOVAN-HCT) 80-12.5 MG per tablet Take 1 tablet by mouth daily.      Home: Home Living Family/patient expects to be discharged to:: Private residence Living Arrangements: Spouse/significant other Available Help at Discharge: Family, Available 24 hours/day Type of Home: House  Home Access: Level entry (via garage) Home Layout: One level Additional Comments: unable to fully assess home environment secondary to decr cognition, no family present  Lives With: Spouse (pt reports spouse has health issues, dau lives 5 mins away)  Functional History: Prior Function Level of Independence: Independent Comments: per pt Functional Status:  Mobility: Bed  Mobility Overal bed mobility: Needs Assistance Bed Mobility: Supine to Sit Supine to sit: Min guard, HOB elevated General bed mobility comments: Heavy use of rail and increased time required. Hands on assist provided for trunk support.  Transfers Overall transfer level: Needs assistance Equipment used: Rolling walker (2 wheeled) Transfers: Sit to/from Stand Sit to Stand: Mod assist, +2 physical assistance General transfer comment: Pt attempted sit<>stand x4 during session. Mod assist +2 was provided on all attempts, however pt was not able to maintain standing at end of session due to fatigue and reported "knees giving out".  Ambulation/Gait Ambulation/Gait assistance: Mod assist, Max assist, +2 physical assistance, +2 safety/equipment Ambulation Distance (Feet): 10 Feet Assistive device: Rolling walker (2 wheeled) Gait Pattern/deviations: Step-to pattern, Decreased stride length, Decreased weight shift to left, Decreased stance time - right (R lateral lean) General Gait Details: heavy lean to Rt, difficulty with Rt hand placement on RW, slowed. Pt required increased assist to maintain upright posture and to assist into leaning towards midline. +2 for physical assist initially, with progression to +2 for chair follow once pt was mobile.  Gait velocity: Decreased Gait velocity interpretation: Below normal speed for age/gender    ADL:    Cognition: Cognition Overall Cognitive Status: No family/caregiver present to determine baseline cognitive functioning Arousal/Alertness: Awake/alert Orientation Level: Oriented to person, Oriented to place, Oriented to time Memory: Impaired Memory Impairment: Decreased recall of new information, Decreased short term memory Decreased Short Term Memory: Verbal basic, Functional basic Awareness: Impaired Problem Solving: Impaired Executive Function: Reasoning, Self Monitoring, Decision Making Reasoning: Impaired Decision Making: Impaired Self  Monitoring: Impaired Behaviors: Poor frustration tolerance, Verbal agitation Safety/Judgment: Impaired Cognition Arousal/Alertness: Awake/alert Behavior During Therapy: WFL for tasks assessed/performed Overall Cognitive Status: No family/caregiver present to determine baseline cognitive functioning Memory: Decreased short-term memory  Blood pressure 146/81, pulse 59, temperature 98.3 F (36.8 C), temperature source Oral, resp. rate 20, height 5\' 10"  (1.778 m), weight 81.6 kg (179 lb 14.3 oz), SpO2 100 %. Physical Exam  Vitals reviewed. Constitutional: He appears well-developed and well-nourished.  HENT:  Head: Normocephalic.  Eyes: EOM are normal.  Neck: Normal range of motion. Neck supple. No thyromegaly present.  Cardiovascular:  Irregularly irregular  Respiratory: Effort normal and breath sounds normal. No respiratory distress.  GI: Soft. Bowel sounds are normal. He exhibits no distension.  Musculoskeletal: He exhibits no edema or tenderness.  Neurological: He is alert.  Patient is able to provide his date of birth as well as age, although appears slight confused.  Poor insight and awareness of his deficits.  He did follow simple commands Sensation intact to light touch Motor: B/l UE: 4/5 proximal to distal B/l LE: 4/5 proximal to distal DTRs symmetric  Skin: Skin is warm and dry.  Psychiatric: He has a normal mood and affect. His behavior is normal.   Results for orders placed or performed during the hospital encounter of 03/08/16 (from the past 24 hour(s))  Glucose, capillary     Status: Abnormal   Collection Time: 03/11/16  6:57 AM  Result Value Ref Range   Glucose-Capillary 135 (H) 65 - 99 mg/dL   Comment 1 Notify RN  Comment 2 Document in Chart   Glucose, capillary     Status: Abnormal   Collection Time: 03/11/16 11:04 AM  Result Value Ref Range   Glucose-Capillary 143 (H) 65 - 99 mg/dL  Glucose, capillary     Status: Abnormal   Collection Time: 03/11/16  4:17  PM  Result Value Ref Range   Glucose-Capillary 210 (H) 65 - 99 mg/dL  Glucose, capillary     Status: Abnormal   Collection Time: 03/11/16  9:34 PM  Result Value Ref Range   Glucose-Capillary 107 (H) 65 - 99 mg/dL   Comment 1 Notify RN    Comment 2 Document in Chart    No results found.  Assessment/Plan: Diagnosis: Left lentiform nuclear sclerosing radiata infarct Labs and images independently reviewed.  Records reviewed and summated above. Stroke: Continue secondary stroke prophylaxis and Risk Factor Modification listed below:   Antiplatelet therapy:   Blood Pressure Management:  Continue current medication with prn's with permisive HTN per primary team Statin Agent:   Diabetes management:    1. Does the need for close, 24 hr/day medical supervision in concert with the patient's rehab needs make it unreasonable for this patient to be served in a less intensive setting? Potentially 2. Co-Morbidities requiring supervision/potential complications: dementia (cont to monitor), HTN (monitor and provide prns in accordance with increased physical exertion and pain), diabetes mellitus (Monitor in accordance with exercise and adjust meds as necessary), CVA 20 years ago, atrial fibrillation (monitor HR with increased activity), dysphagia (cont SLP, advance diet as tolerated), Normocytic anemia (transfuse if necessary to ensure appropriate perfusion for increased activity tolerance) 3. Due to safety, disease management, medication administration and patient education, does the patient require 24 hr/day rehab nursing? Yes 4. Does the patient require coordinated care of a physician, rehab nurse, PT (1-2 hrs/day, 5 days/week), OT (1-2 hrs/day, 5 days/week) and SLP (1-2 hrs/day, 5 days/week) to address physical and functional deficits in the context of the above medical diagnosis(es)? Yes Addressing deficits in the following areas: balance, endurance, locomotion, strength, transferring, dressing,  toileting, cognition and psychosocial support 5. Can the patient actively participate in an intensive therapy program of at least 3 hrs of therapy per day at least 5 days per week? Yes 6. The potential for patient to make measurable gains while on inpatient rehab is excellent 7. Anticipated functional outcomes upon discharge from inpatient rehab are min assist  with PT, min assist with OT, supervision and min assist with SLP. 8. Estimated rehab length of stay to reach the above functional goals is: 16-19 days. 9. Does the patient have adequate social supports and living environment to accommodate these discharge functional goals? Potentially 10. Anticipated D/C setting: TBD 11. Anticipated post D/C treatments: HH therapy and Home excercise program 12. Overall Rehab/Functional Prognosis: good  RECOMMENDATIONS: This patient's condition is appropriate for continued rehabilitative care in the following setting: Will need to clarify caregiver support at discharge.  If this is unavailable, pt may require SNF as he will unlikely be able to obtain and independent level of functioning after a short IRF. Patient has agreed to participate in recommended program. Yes Note that insurance prior authorization may be required for reimbursement for recommended care.  Comment: Rehab Admissions Coordinator to follow up.  Delice Lesch, MD 03/12/2016

## 2016-03-12 NOTE — NC FL2 (Signed)
[place] LEVEL OF CARE SCREENING TOOL     IDENTIFICATION  Patient Name: Dustin [name] Birthdate: Dec 03, 1926 Sex: male Admission Date (Current Location): 03/08/2016  [facility] and [place] Number:  Herbalist and Address:  [facility], [place] [address], [place], [place] [address]      Provider Number: [id number]  Attending Physician Name and Address:  Barton Dubois, MD  Relative Name and Phone Number:  [phone]    Current Level of Care: Hospital Recommended Level of Care: [place] Prior Approval Number:    Date Approved/Denied:   PASRR Number:  pending  Discharge Plan: SNF    Current Diagnoses: Patient Active Problem List   Diagnosis Date Noted  . History of CVA (cerebrovascular accident)   . PAF (paroxysmal atrial fibrillation) ([place])   . Normocytic anemia   . Cerebral infarction due to unspecified mechanism   . Acute CVA (cerebrovascular accident) ([place]) 03/09/2016  . Physical debility 03/09/2016  . Essential hypertension 03/09/2016  . Diabetes mellitus type 2, noninsulin dependent ([place]) 03/09/2016  . Chronic atrial fibrillation ([place]) 03/09/2016  . DNR (do not resuscitate) 03/09/2016  . Falls 03/09/2016  . Right leg weakness 03/09/2016  . Pancreatic mass 01/25/2016  . Dementia   . Fall 02/15/2015  . Multiple fractures of ribs of left side 02/15/2015  . Traumatic pneumothorax 02/14/2015    Orientation RESPIRATION BLADDER Height & Weight     Self, Time, Situation, Place  Normal Continent Weight: 179 lb 14.3 oz (81.6 kg) Height:  5\' 10"  (177.8 cm)  BEHAVIORAL SYMPTOMS/MOOD NEUROLOGICAL BOWEL NUTRITION STATUS      Continent Diet (Mechanical Soft)  AMBULATORY STATUS COMMUNICATION OF NEEDS Skin   Limited Assist Verbally Normal                       Personal Care Assistance Level of Assistance  Bathing, Dressing Bathing Assistance: Limited assistance   Dressing  Assistance: Limited assistance     Functional Limitations Info  Sight, Hearing, Speech Sight Info: Adequate Hearing Info: Adequate Speech Info: Adequate    SPECIAL CARE FACTORS FREQUENCY  PT (By licensed PT), OT (By licensed OT), Speech therapy     PT Frequency: 5x a week OT Frequency: 5x a week            Contractures Contractures Info: Not present    Additional Factors Info  Code Status, Allergies, Psychotropic, Insulin Sliding Scale Code Status Info: DNR Allergies Info: NKA Psychotropic Info: Zoloft Insulin Sliding Scale Info: 3x a day       Current Medications (03/12/2016):  This is the current hospital active medication list Current Facility-Administered Medications  Medication Dose Route Frequency Provider Last Rate Last Dose  .  stroke: mapping our early stages of recovery book   Does not apply Once Roney Jaffe, MD      . acetaminophen (TYLENOL) tablet 650 mg  650 mg Oral Q4H PRN Roney Jaffe, MD       Or  . acetaminophen (TYLENOL) suppository 650 mg  650 mg Rectal Q4H PRN Roney Jaffe, MD      . amoxicillin (AMOXIL) capsule 500 mg  500 mg Oral QID Roney Jaffe, MD   500 mg at 03/12/16 1718  . bisacodyl (DULCOLAX) suppository 10 mg  10 mg Rectal Daily PRN Barton Dubois, MD      . brimonidine (ALPHAGAN) 0.2 % ophthalmic solution 1 drop  1 drop Both Eyes BID Roney Jaffe, MD  1 drop at 03/12/16 1038  . dabigatran (PRADAXA) capsule 150 mg  150 mg Oral BID Roney Jaffe, MD   150 mg at 03/12/16 1033  . haloperidol lactate (HALDOL) injection 0.5 mg  0.5 mg Intravenous Q8H PRN Barton Dubois, MD   0.5 mg at 03/09/16 1511  . irbesartan (AVAPRO) tablet 75 mg  75 mg Oral Daily Roney Jaffe, MD   75 mg at 03/12/16 1038   And  . hydrochlorothiazide (MICROZIDE) capsule 12.5 mg  12.5 mg Oral Daily Roney Jaffe, MD   12.5 mg at 03/12/16 1034  . insulin aspart (novoLOG) injection 0-8 Units  0-8 Units Subcutaneous TID WC Roney Jaffe, MD   2 Units at 03/12/16 1200   . metFORMIN (GLUCOPHAGE) tablet 500 mg  500 mg Oral Q breakfast Roney Jaffe, MD   500 mg at 03/12/16 1033  . pravastatin (PRAVACHOL) tablet 40 mg  40 mg Oral q1800 Roney Jaffe, MD   40 mg at 03/12/16 1718  . senna-docusate (Senokot-S) tablet 1 tablet  1 tablet Oral QHS PRN Roney Jaffe, MD   1 tablet at 03/11/16 1041  . sertraline (ZOLOFT) tablet 25 mg  25 mg Oral Daily Roney Jaffe, MD   25 mg at 03/12/16 1037  . temazepam (RESTORIL) capsule 7.5 mg  7.5 mg Oral QHS PRN Barton Dubois, MD   7.5 mg at 03/10/16 2226     Discharge Medications: Please see discharge summary for a list of discharge medications.  Relevant Imaging Results:  Relevant Lab Results:   Additional Information SSN   Taraoluwa Thakur, Jones Broom, LCSWA

## 2016-03-13 LAB — GLUCOSE, CAPILLARY
Glucose-Capillary: 132 mg/dL — ABNORMAL HIGH (ref 65–99)
Glucose-Capillary: 204 mg/dL — ABNORMAL HIGH (ref 65–99)
Glucose-Capillary: 124 mg/dL — ABNORMAL HIGH (ref 65–99)

## 2016-03-13 MED ORDER — ACETAMINOPHEN 325 MG PO TABS: 650.0000 mg | ORAL_TABLET | Freq: Four times a day (QID) | ORAL | Status: DC | PRN

## 2016-03-13 NOTE — Progress Notes (Addendum)
I have made attempts to reach pt's wife and daughter over the phone.  I left a voicemail on the [phone] number.  Pt. Is unable to tell me if this is his wife's number (appears  On facesheet).  I will continue to reach pt's family for plan.  Please call if questions.  Redbird Smith Admissions Coordinator Cell [phone] Office [phone]   1252:  I note pt. Has discharge orders and a DC summary.  I will sign off.    Gerlean Ren

## 2016-03-13 NOTE — Clinical Social Work Note (Signed)
Clinical Social Worker contacted patient's dtr, Angela Nevin in regards to discharge planning. Pt's dtr still out of town and expected to return on tomorrow, 6/6.   IP REHAB has signed off. [facility] willing to [facility] patient on 5 day LOG pending auth however will require family to complete paperwork due to diagnosis of Dementia.   Pt's dtr requesting to discus d/c plans with MD. MD notified and planning to contact dtr, Carla.   CSW remains available as needed.   Glendon Axe, MSW, LCSWA [phone] 03/13/2016 2:17 PM

## 2016-03-13 NOTE — Clinical Social Work Note (Signed)
Admissions paperwork to be completed at bedside.   Clinical Social Worker facilitated patient discharge including contacting patient family and facility to confirm patient discharge plans.  Clinical information faxed to facility and family agreeable with plan.  CSW arranged ambulance transport via [facility] to [facility] and [facility] .  RN to call report prior to discharge.  Clinical Social Worker will sign off for now as social work intervention is no longer needed. Please consult [place] again if new need arises.  Glendon Axe, MSW, LCSWA [phone] 03/13/2016 3:07 PM

## 2016-03-13 NOTE — Clinical Social Work Placement (Signed)
   CLINICAL SOCIAL WORK PLACEMENT  NOTE  Date:  03/13/2016  Patient Details  Name: Dustin [name] MRN: [id number] Date of Birth: 10-01-1927  Clinical Social Work is seeking post-discharge placement for this patient at the [place] level of care (*CSW will initial, date and re-position this form in  chart as items are completed):  Yes   Patient/family provided with [facility] Work Department's list of facilities offering this level of care within the geographic area requested by the patient (or if unable, by the patient's family).  Yes   Patient/family informed of their freedom to choose among providers that offer the needed level of care, that participate in Medicare, Medicaid or managed care program needed by the patient, have an available bed and are willing to accept the patient.  Yes   Patient/family informed of [facility]'s ownership interest in [facility] and [facility], as well as of the fact that they are under no obligation to receive care at these facilities.  PASRR submitted to EDS on       PASRR number received on       Existing PASRR number confirmed on 03/13/16     FL2 transmitted to all facilities in geographic area requested by pt/family on 03/13/16     FL2 transmitted to all facilities within larger geographic area on       Patient informed that his/her managed care company has contracts with or will negotiate with certain facilities, including the following:        Yes   Patient/family informed of bed offers received.  Patient chooses bed at  [facility] )     Physician recommends and patient chooses bed at      Patient to be transferred to  ([facility]) on 03/13/16.  Patient to be transferred to facility by  [facility] )     Patient family notified on 03/13/16 of transfer.  Name of family member notified:   (Pt's wife at bedside and dtr, Dominica via telephone)      PHYSICIAN Please sign FL2, Please prepare priority discharge summary, including medications     Additional Comment:    _______________________________________________ Rozell Searing, LCSW 03/13/2016, 3:07 PM

## 2016-03-13 NOTE — Progress Notes (Signed)
I met with the patient at the bedside to discuss the recommendation for IP rehab as long as  he has adequate social supports per PRM consult.  He gives me permission to phone his wife to discuss rehab plans.  I have updated Kelli Willard, RNCM of where we are in process.  She states pt. is likely medically ready for DC today.  I will not be able to get insurance approval today in all likelihood.  I will phone wife.  Please call if questions.    PT Inpatient Rehab Admissions Coordinator Cell [phone] Office [phone]  

## 2016-03-13 NOTE — Progress Notes (Signed)
Physical Therapy Treatment Patient Details Name: Dustin [name] MRN: [id number] DOB: 1927-04-15 Today's Date: 03/13/2016    History of Present Illness Pt is an 82 y.o. male with hx of afib, HLD, dementia, HTN, DM2 and CVA 20 yrs ago who presented to ED with multiple falls and unable to stand on R leg. MRI showed acute L corona radiata/lentiform nucleus and also remote lacunar infarcts L basal ganglia and R thalamus, multiple small microbleeds. PMH includes h/o ETOH use, Hx of prostate cancer rx w surgery    PT Comments    Pt con't to demo impaired balance, R LE weakness, impaired R LE sensation and requires maxAx2 for safe ambulation due to strong R lateral lean and R LE weakness. Acute PT to con't to follow.  Follow Up Recommendations  CIR     Equipment Recommendations  Rolling walker with 5" wheels;3in1 (PT);Wheelchair (measurements PT)    Recommendations for Other Services Rehab consult     Precautions / Restrictions Precautions Precautions: Fall Restrictions Weight Bearing Restrictions: No    Mobility  Bed Mobility Overal bed mobility: Needs Assistance Bed Mobility: Supine to Sit     Supine to sit: Mod assist     General bed mobility comments: max directional v/c's, increased time, assist for trunk elevation  Transfers Overall transfer level: Needs assistance Equipment used: Rolling walker (2 wheeled) Transfers: Sit to/from Stand Sit to Stand: +2 physical assistance;Mod assist         General transfer comment: v/c's for safe hand placement, modA to achieve full upright trunk and step into walker  Ambulation/Gait Ambulation/Gait assistance: Max assist;+2 physical assistance Ambulation Distance (Feet): 30 Feet (x2) Assistive device: Rolling walker (2 wheeled);2 person hand held assist Gait Pattern/deviations: Decreased stride length;Step-to pattern;Decreased stance time - right;Staggering right Gait velocity: dec Gait velocity interpretation: Below normal  speed for age/gender General Gait Details: trialed ambulation with bilat HHA due to pt pushing walker to far out in front of self. Pt con't to required maxA to advance R LE and max tactile cues to weight shift and sequence stepping pattern   Stairs            Wheelchair Mobility    Modified Rankin (Stroke Patients Only) Modified Rankin (Stroke Patients Only) Pre-Morbid Rankin Score: Slight disability Modified Rankin: Moderately severe disability     Balance Overall balance assessment: Needs assistance Sitting-balance support: Feet supported;No upper extremity supported Sitting balance-Leahy Scale: Fair     Standing balance support: Bilateral upper extremity supported Standing balance-Leahy Scale: Poor                      Cognition Arousal/Alertness: Awake/alert Behavior During Therapy: WFL for tasks assessed/performed Overall Cognitive Status: History of cognitive impairments - at baseline (has dementia)       Memory: Decreased short-term memory              Exercises      General Comments        Pertinent Vitals/Pain Pain Assessment: Faces Faces Pain Scale: Hurts a little bit Pain Location: R thight Pain Descriptors / Indicators: Sore (bruised) Pain Intervention(s): Monitored during session    Home Living                      Prior Function            PT Goals (current goals can now be found in the care plan section) Acute Rehab PT Goals Patient Stated Goal: none stated  Progress towards PT goals: Progressing toward goals    Frequency  Min 4X/week    PT Plan Current plan remains appropriate    Co-evaluation             End of Session Equipment Utilized During Treatment: Gait belt Activity Tolerance: Patient tolerated treatment well Patient left: in chair;with call bell/phone within reach;with chair alarm set     Time: [id number] PT Time Calculation (min) (ACUTE ONLY): 21 min  Charges:  $Gait Training: 8-22  mins                    G Codes:      Kingsley Callander 03/13/2016, 2:26 PM   Kittie Plater, PT, DPT Pager #: [phone] Office #: [phone]

## 2016-03-13 NOTE — Discharge Summary (Signed)
Physician Discharge Summary  Dustin [name] [id number] DOB: April 15, 1927 DOA: 03/08/2016  PCP: Osborne Casco, MD  Admit date: 03/08/2016 Discharge date: 03/13/2016  Time spent: 35 minutes  Recommendations for Outpatient Follow-up:  BMET to follow up renal function and electrolytes (in 1 week) Reassess BP and adjust medications as needed  Discharge Diagnoses:  Principal Problem:   Acute CVA (cerebrovascular accident) ([place]) Active Problems:   Dementia   Physical debility   Essential hypertension   Diabetes mellitus type 2, noninsulin dependent ([place])   Chronic atrial fibrillation ([place])   DNR (do not resuscitate)   Falls   Right leg weakness   Cerebral infarction due to unspecified mechanism   History of CVA (cerebrovascular accident)   PAF (paroxysmal atrial fibrillation) ([place])   Normocytic anemia   Discharge Condition: stable and improved. Will discharge to [facility] for rehabilitation and further care. Follow up with neurology in 2 months.  Diet recommendation: heart healthy and modified carb diet  Filed Weights   03/09/16 0300  Weight: 81.6 kg (179 lb 14.3 oz)    History of present illness:  82 y.o. male history of atrial fibrillation on Pradaxa, hypertension, hyperlipidemia, review stroke, diabetes mellitus and dementia, who was brought to the ED at [facility] with a history of recurrent falls and right-sided weakness. Exact onset is unclear. He was last known well on 03/07/2016. MRI showed multiple acute nonhemorrhagic infarction involving the left lentiform nucleus/corona radiata. NIH stroke score was 6.  Hospital Course:  1-Dominant left lentiform nucleus/corona radiata stroke -had hx of A. Fib; was off meds recently for cataracts and dental procedure -2-D echo w/o acute source of emboli; preserved EF. -continue secondary prevention with pradaxa -concerns of potential medication compliance issues; at discharge from SNF will benefit of [facility] -will be  discharge to SNF for rehabilitation   2- HTN -stable currently -allowed permissive HTN while in acute ischemic setting -will resume home antihypertensive regimen at discharge   3-HLD: -continue statins -LDL 90  4-diabetes type 2 -A1C 6.6 -continue oral hypoglycemic regimen and modified carb diet   5-depression/anxiety -continue zoloft  6-glaucoma: -Continue alphagan   7-dementia: -continue supportive care -constant reorientation   Procedures:  MRI left lentiform nucleus/corona radiata infarct. Old L basal ganglia and R thalamic lacunes. small vessel disease.  TCd - Low mean flow velocities throughout anterior and posterior cerebral circulations due to suboptimal waveforms from poor occipital windows.Globally elevated pulsatility indices suggest diffuse intracranial atherosclerosis.   Carotid Doppler - 1-39% internal carotid artery stenosis bilaterally. Vertebral arteries are patent with antegrade flow.  2D Echo - - Left ventricle: The cavity size was normal. There was mild  concentric hypertrophy. Systolic function was normal. The  estimated ejection fraction was in the range of 60% to 65%. Wall  motion was normal; there were no regional wall motion  abnormalities. - Aortic valve: Moderate diffuse thickening and calcification. - Left atrium: The atrium was mildly dilated.  Consultations:  Neurology   Discharge Exam: Filed Vitals:   03/13/16 0505 03/13/16 0930  BP: 141/59 114/61  Pulse: 59 78  Temp: 98 F (36.7 C) 98.3 F (36.8 C)  Resp: 20 20    General: Afebrile, no CP, oriented X2 (with some intermittent confusion and confabulation); had hx of dementia at baseline. Right hemiparesis on present on exam. No dysarthria or apraxia. Denies SOB.  Cardiovascular: rate controlled, no rubs or gallops  Respiratory: no wheezing or crackles, good air movement   Abdomen: soft, NT, ND, positive BS  Musculoskeletal:  no cyanosis or clubbing    Neurologic exam: no apraxia, no aphasia or dysarthria; PERRLA, MS 4/5 on left side and 3/5 on right side upper and lower extremities). Gait not tested   Discharge Instructions   Discharge Instructions    Diet - low sodium heart healthy    Complete by:  As directed      Discharge instructions    Complete by:  As directed   Medications as prescribed Maintain adequate hydration  Follow heart healthy diet Follow up in 2 months with Dr. Leonie Man  Repeat BMET in 1 week to follow electrolytes and renal function          Current Discharge Medication List    START taking these medications   Details  acetaminophen (TYLENOL) 325 MG tablet Take 2 tablets (650 mg total) by mouth every 6 (six) hours as needed for mild pain, fever or headache.      CONTINUE these medications which have NOT CHANGED   Details  amoxicillin (AMOXIL) 500 MG tablet Take 500 mg by mouth 4 (four) times daily. ABT Start Date 03/07/16 & End Date 03/17/16.    brimonidine (ALPHAGAN P) 0.1 % SOLN Place 1 drop into both eyes 2 (two) times daily.     dabigatran (PRADAXA) 150 MG CAPS capsule Take 150 mg by mouth 2 (two) times daily.    lovastatin (MEVACOR) 40 MG tablet Take 40 mg by mouth daily.     metFORMIN (GLUCOPHAGE) 500 MG tablet Take 500 mg by mouth daily.    sertraline (ZOLOFT) 25 MG tablet Take 1 tablet by mouth daily. Refills: 3    valsartan-hydrochlorothiazide (DIOVAN-HCT) 80-12.5 MG per tablet Take 1 tablet by mouth daily.       No Known Allergies Follow-up Information    Follow up with SETHI,PRAMOD, MD In 2 months.   Specialties:  Neurology, Radiology   Contact information:   [address] [place] [address] [phone]        The results of significant diagnostics from this hospitalization (including imaging, microbiology, ancillary and laboratory) are listed below for reference.    Significant Diagnostic Studies: Dg Chest 2 View  03/08/2016  CLINICAL DATA:  Acute onset of  generalized weakness. Initial encounter. EXAM: CHEST  2 VIEW COMPARISON:  Chest radiograph performed 02/17/2015 FINDINGS: The lungs are well-aerated and clear. There is no evidence of focal opacification, pleural effusion or pneumothorax. The heart is normal in size; the mediastinal contour is within normal limits. No acute osseous abnormalities are seen. Chronic left-sided rib deformities are noted. IMPRESSION: No acute cardiopulmonary process seen. Electronically Signed   By: Garald Balding M.D.   On: 03/08/2016 22:30   Ct Head Wo Contrast  03/08/2016  CLINICAL DATA:  Status post multiple falls.  History of dementia. EXAM: CT HEAD WITHOUT CONTRAST CT CERVICAL SPINE WITHOUT CONTRAST TECHNIQUE: Multidetector CT imaging of the head and cervical spine was performed following the standard protocol without intravenous contrast. Multiplanar CT image reconstructions of the cervical spine were also generated. COMPARISON:  02/16/2015 FINDINGS: CT HEAD FINDINGS No mass effect or midline shift. No evidence of acute intracranial hemorrhage, or infarction. No abnormal extra-axial fluid collections. There is atrophy and chronic small vessel disease changes. Probably remote left basal ganglia lacunar infarct is seen. Basal cisterns are preserved. No depressed skull fractures. Visualized paranasal sinuses and mastoid air cells are not opacified. CT CERVICAL SPINE FINDINGS There is straightening of the cervical lordosis, likely positional. There is no evidence for acute fracture  or dislocation. Multilevel moderate to severe osteoarthritic changes of the cervical spine, including posterior facet arthropathy. Carotid calcifications are seen. Prevertebral soft tissues have a normal appearance. Lung apices have a normal appearance. IMPRESSION: No acute intracranial abnormality. Atrophy, chronic microvascular disease. Remote left basal ganglia lacunar infarct. No evidence of acute traumatic injury to the cervical spine. Multilevel  osteoarthritic changes of the cervical spine. Electronically Signed   By: Fidela Salisbury M.D.   On: 03/08/2016 19:35   Ct Cervical Spine Wo Contrast  03/08/2016  CLINICAL DATA:  Status post multiple falls.  History of dementia. EXAM: CT HEAD WITHOUT CONTRAST CT CERVICAL SPINE WITHOUT CONTRAST TECHNIQUE: Multidetector CT imaging of the head and cervical spine was performed following the standard protocol without intravenous contrast. Multiplanar CT image reconstructions of the cervical spine were also generated. COMPARISON:  02/16/2015 FINDINGS: CT HEAD FINDINGS No mass effect or midline shift. No evidence of acute intracranial hemorrhage, or infarction. No abnormal extra-axial fluid collections. There is atrophy and chronic small vessel disease changes. Probably remote left basal ganglia lacunar infarct is seen. Basal cisterns are preserved. No depressed skull fractures. Visualized paranasal sinuses and mastoid air cells are not opacified. CT CERVICAL SPINE FINDINGS There is straightening of the cervical lordosis, likely positional. There is no evidence for acute fracture or dislocation. Multilevel moderate to severe osteoarthritic changes of the cervical spine, including posterior facet arthropathy. Carotid calcifications are seen. Prevertebral soft tissues have a normal appearance. Lung apices have a normal appearance. IMPRESSION: No acute intracranial abnormality. Atrophy, chronic microvascular disease. Remote left basal ganglia lacunar infarct. No evidence of acute traumatic injury to the cervical spine. Multilevel osteoarthritic changes of the cervical spine. Electronically Signed   By: Fidela Salisbury M.D.   On: 03/08/2016 19:35   Mr Brain Wo Contrast  03/08/2016  CLINICAL DATA:  Initial evaluation for dementia. EXAM: MRI HEAD WITHOUT CONTRAST TECHNIQUE: Multiplanar, multiecho pulse sequences of the brain and surrounding structures were obtained without intravenous contrast. COMPARISON:  Prior CT  from earlier the same day. FINDINGS: Diffuse prominence of the CSF containing spaces is compatible with advanced cerebral atrophy. Patchy and confluent T2/FLAIR hyperintensity within the periventricular and deep white matter both cerebral hemispheres most consistent with chronic small vessel ischemic disease, moderate nature. Chronic small vessel ischemic type changes present within the pons. Remote lacunar infarcts present within the left basal ganglia and right thalamus. No remote cortical ischemia appreciated. Numerous foci susceptibility artifact seen scattered throughout the deep gray nuclei, brainstem, and cerebellum, with a few additional tiny foci within the cerebral hemispheres, most consistent with chronic small micro hemorrhages, likely related to chronic underlying hypertension. Acute curvilinear ischemic infarct involving the posterior aspect of the left lentiform nucleus extending into the left corona radiata present (series 4, image 30). No associated hemorrhage or mass effect. No other acute infarct. Gray-white matter differentiation otherwise maintained. Major intracranial vascular flow voids are preserved. No mass lesion, midline shift, or mass effect. Ventricular prominence related to global parenchymal volume loss without hydrocephalus. No extra-axial fluid collection. Major dural sinuses are grossly patent. Craniocervical junction normal. Scattered degenerative spondylolysis within the visualized upper cervical spine without significant stenosis. Incidental note made of a partially empty sella. No acute abnormality about the orbits. Patient is status post cataract extraction on the left. Paranasal sinuses are clear. Left mastoid effusion noted. Right mastoid air cells clear. Inner ear structures normal. Bone marrow signal intensity within normal limits. No scalp soft tissue abnormality. IMPRESSION: 1. Small acute ischemic nonhemorrhagic infarct  involving the left lentiform nucleus/corona  radiata. No associated mass effect. 2. Remote lacunar infarcts involving the left basal ganglia and right thalamus. 3. Multiple small chronic micro hemorrhages involving the deep gray nuclei, brainstem, and cerebellum, most likely related to chronic underlying hypertension. 4. Advanced cerebral atrophy with moderate chronic small vessel ischemic disease. 5. Left mastoid effusion. Electronically Signed   By: Jeannine Boga M.D.   On: 03/08/2016 22:09    Microbiology: Recent Results (from the past 240 hour(s))  Urine culture     Status: None   Collection Time: 03/08/16  6:31 PM  Result Value Ref Range Status   Specimen Description URINE, CLEAN CATCH  Final   Special Requests NONE  Final   Culture NO GROWTH Performed at [facility]   Final   Report Status 03/10/2016 FINAL  Final     Labs: Basic Metabolic Panel:  Recent Labs Lab 03/08/16 1953 03/08/16 2032  NA 140 138  K 3.8 3.8  CL 99* 101  CO2  --  28  GLUCOSE 136* 139*  BUN 33* 31*  CREATININE 1.20 1.09  CALCIUM  --  9.6   Liver Function Tests:  Recent Labs Lab 03/08/16 2032  AST 37  ALT 29  ALKPHOS 51  BILITOT 4.1*  PROT 7.1  ALBUMIN 4.6   CBC:  Recent Labs Lab 03/08/16 1953 03/08/16 2032  WBC  --  8.8  NEUTROABS  --  6.3  HGB 12.9* 12.7*  HCT 38.0* 36.7*  MCV  --  95.1  PLT  --  187    CBG:  Recent Labs Lab 03/12/16 1111 03/12/16 1627 03/12/16 2123 03/13/16 0504 03/13/16 1133  GLUCAP 181* 127* 149* 132* 204*    Signed:  Barton Dubois MD.  [facility] 03/13/2016, 12:29 PM

## 2016-03-13 NOTE — Clinical Social Work Note (Signed)
[place] MUST PASARR confirmed: [id number]. SSN also confirmed: [place], MSW, LCSWA [phone] 03/13/2016 3:09 PM

## 2016-03-13 NOTE — Progress Notes (Signed)
I was able to speak with pt's daughter Angela Nevin over the phone from [place].  She received a call from Dr. Dyann Kief while we were talking.  Per Angela Nevin, Dr. Dyann Kief believes pt. will need a longer rehab period than CIR could afford.  Per PMR consult, unless pt. has caregiver support, SNF is preferred.  Daughter acknowledges that pt. will need a longer recovery period and is in agreement with SNF.  I updated Glendon Axe, CSW.   Please call if questions.  [place] Admissions Coordinator Cell [phone] Office [phone]

## 2016-03-13 NOTE — Progress Notes (Signed)
Pt discharged to [facility]. Report called to Dellis Filbert at facility.  Discharge information placed into chart for PTAR to take to facility.  Telemetry discontinued. Wendee Copp

## 2016-05-23 ENCOUNTER — Encounter: Payer: Self-pay | Admitting: Neurology

## 2016-05-23 ENCOUNTER — Ambulatory Visit (INDEPENDENT_AMBULATORY_CARE_PROVIDER_SITE_OTHER): Payer: Medicare PPO | Admitting: Neurology

## 2016-05-23 VITALS — BP 138/79 | HR 51 | Ht 70.0 in

## 2016-05-23 DIAGNOSIS — I6381 Other cerebral infarction due to occlusion or stenosis of small artery: Secondary | ICD-10-CM

## 2016-05-23 DIAGNOSIS — I639 Cerebral infarction, unspecified: Secondary | ICD-10-CM

## 2016-05-23 DIAGNOSIS — F0280 Dementia in other diseases classified elsewhere without behavioral disturbance: Secondary | ICD-10-CM

## 2016-05-23 DIAGNOSIS — G308 Other Alzheimer's disease: Secondary | ICD-10-CM

## 2016-05-23 MED ORDER — DONEPEZIL HCL 5 MG PO TABS: 5.0000 mg | ORAL_TABLET | Freq: Every day | ORAL | 3 refills | Status: DC

## 2016-05-23 NOTE — Patient Instructions (Addendum)
I had a long d/w patient about his recent stroke, risk for recurrent stroke/TIAs, personally independently reviewed imaging studies and stroke evaluation results and answered questions.Continue Pradaxa  for atrial fibrillation for secondary stroke prevention and maintain strict control of hypertension with blood pressure goal below 130/90, diabetes with hemoglobin A1c goal below 6.5% and lipids with LDL cholesterol goal below 70 mg/dL. I also recommend continuing on ongoing physical and occupational therapy. Trial of Aricept for his dementia. Start 5 mg daily with food for one month and then increase to 10 mg daily if tolerated. I have discussed possible side effects with the patient's, wife and daughter and answered questions. Return for follow-up in 3 months with nurse practitioner call earlier if necessary   Dementia Dementia is a general term for problems with brain function. A person with dementia has memory loss and a hard time with at least one other brain function such as thinking, speaking, or problem solving. Dementia can affect social functioning, how you do your job, your mood, or your personality. The changes may be hidden for a long time. The earliest forms of this disease are usually not detected by family or friends. Dementia can be:  Irreversible.  Potentially reversible.  Partially reversible.  Progressive. This means it can get worse over time. CAUSES  Irreversible dementia causes may include:  Degeneration of brain cells (Alzheimer disease or Lewy body dementia).  Multiple small strokes (vascular dementia).  Infection (chronic meningitis or Creutzfeldt-Jakob disease).  Frontotemporal dementia. This affects younger people, age 6 to 44, compared to those who have Alzheimer disease.  Dementia associated with other disorders like Parkinson disease, Huntington disease, or HIV-associated dementia. Potentially or partially reversible dementia causes may  include:  Medicines.  Metabolic causes such as excessive alcohol intake, vitamin B12 deficiency, or thyroid disease.  Masses or pressure in the brain such as a tumor, blood clot, or hydrocephalus. SIGNS AND SYMPTOMS  Symptoms are often hard to detect. Family members or coworkers may not notice them early in the disease process. Different people with dementia may have different symptoms. Symptoms can include:  A hard time with memory, especially recent memory. Long-term memory may not be impaired.  Asking the same question multiple times or forgetting something someone just said.  A hard time speaking your thoughts or finding certain words.  A hard time solving problems or performing familiar tasks (such as how to use a telephone).  Sudden changes in mood.  Changes in personality, especially increasing moodiness or mistrust.  Depression.  A hard time understanding complex ideas that were never a problem in the past. DIAGNOSIS  There are no specific tests for dementia.   Your health care provider may recommend a thorough evaluation. This is because some forms of dementia can be reversible. The evaluation will likely include a physical exam and getting a detailed history from you and a family member. The history often gives the best clues and suggestions for a diagnosis.  Memory testing may be done. A detailed brain function evaluation called neuropsychologic testing may be helpful.  Lab tests and brain imaging (such as a CT scan or MRI scan) are sometimes important.  Sometimes observation and re-evaluation over time is very helpful. TREATMENT  Treatment depends on the cause.   If the problem is a vitamin deficiency, it may be helped or cured with supplements.  For dementias such as Alzheimer disease, medicines are available to stabilize or slow the course of the disease. There are no cures for this  type of dementia.  Your health care provider can help direct you to groups,  organizations, and other health care providers to help with decisions in the care of you or your loved one. HOME CARE INSTRUCTIONS The care of individuals with dementia is varied and dependent upon the progression of the dementia. The following suggestions are intended for the person living with, or caring for, the person with dementia.  Create a safe environment.  Remove the locks on bathroom doors to prevent the person from accidentally locking himself or herself in.  Use childproof latches on kitchen cabinets and any place where cleaning supplies, chemicals, or alcohol are kept.  Use childproof covers in unused electrical outlets.  Install childproof devices to keep doors and windows secured.  Remove stove knobs or install safety knobs and an automatic shut-off on the stove.  Lower the temperature on water heaters.  Label medicines and keep them locked up.  Secure knives, lighters, matches, power tools, and guns, and keep these items out of reach.  Keep the house free from clutter. Remove rugs or anything that might contribute to a fall.  Remove objects that might break and hurt the person.  Make sure lighting is good, both inside and outside.  Install grab rails as needed.  Use a monitoring device to alert you to falls or other needs for help.  Reduce confusion.  Keep familiar objects and people around.  Use night lights or dim lights at night.  Label items or areas.  Use reminders, notes, or directions for daily activities or tasks.  Keep a simple, consistent routine for waking, meals, bathing, dressing, and bedtime.  Create a calm, quiet environment.  Place large clocks and calendars prominently.  Display emergency numbers and home address near all telephones.  Use cues to establish different times of the day. An example is to open curtains to let the natural light in during the day.   Use effective communication.  Choose simple words and short  sentences.  Use a gentle, calm tone of voice.  Be careful not to interrupt.  If the person is struggling to find a word or communicate a thought, try to provide the word or thought.  Ask one question at a time. Allow the person ample time to answer questions. Repeat the question again if the person does not respond.  Reduce nighttime restlessness.  Provide a comfortable bed.  Have a consistent nighttime routine.  Ensure a regular walking or physical activity schedule. Involve the person in daily activities as much as possible.  Limit napping during the day.  Limit caffeine.  Attend social events that stimulate rather than overwhelm the senses.  Encourage good nutrition and hydration.  Reduce distractions during meal times and snacks.  Avoid foods that are too hot or too cold.  Monitor chewing and swallowing ability.  Continue with routine vision, hearing, dental, and medical screenings.  Give medicines only as directed by the health care provider.  Monitor driving abilities. Do not allow the person to drive when safe driving is no longer possible.  Register with an identification program which could provide location assistance in the event of a missing person situation. SEEK MEDICAL CARE IF:   New behavioral problems start such as moodiness, aggressiveness, or seeing things that are not there (hallucinations).  Any new problem with brain function happens. This includes problems with balance, speech, or falling a lot.  Problems with swallowing develop.  Any symptoms of other illness happen. Small changes or worsening  in any aspect of brain function can be a sign that the illness is getting worse. It can also be a sign of another medical illness such as infection. Seeing a health care provider right away is important. SEEK IMMEDIATE MEDICAL CARE IF:   A fever develops.  New or worsened confusion develops.  New or worsened sleepiness develops.  Staying awake  becomes hard to do.   This information is not intended to replace advice given to you by your health care provider. Make sure you discuss any questions you have with your health care provider.   Document Released: 03/20/2001 Document Revised: 10/15/2014 Document Reviewed: 02/19/2011 Elsevier Interactive Patient Education [employer].

## 2016-05-23 NOTE — Progress Notes (Signed)
[facility] [address] [place]. [place] [address] [phone]       OFFICE FOLLOW-UP NOTE  Dustin. Dustin [name] Date of Birth:  [birth year] Medical Record Number:  [id number]   HPI: Dustin [name] is 82 year Caucasian male who is seen today for the first office follow-up visit following hospital admission for stroke in May 2017. He is accompanied by his wife and daughter who provides most of the history.Dustin [name] is an 82 y.o. male history of atrial fibrillation on Pradaxa, hypertension, hyperlipidemia, stroke, diabetes mellitus and dementia, who was brought to the ED at [facility] with a history of recurrent falls and right-sided weakness. Exact onset is unclear. He was last known well on 03/07/2016. MRI showed multiple acute nonhemorrhagic infarction involving the left lentiform nucleus/corona radiata. NIH stroke score was 6. Patient was not administered IV t-PA secondary to  beyond time under for treatment consideration. He was admitted for further evaluation and treatment. CT scan of the head on admission showed remote left basal ganglia lacunar infarct. MRI scan confirmed a small acute left lentiform nucleus/corona radiata infarct. There are multiple chronic microhemorrhages in the deep gray matter and brainstem noted indicative of chronic hypertension. There is also advanced generalized cerebral atrophy. Transcranial Doppler study showed low mean flow velocities throughout with globally elevated pulsatility indexes suggesting diffuse intracranial atherosclerosis. Carotid Doppler showed no significant extracranial stenosis. LDL cholesterol was 90 mg percent. Hemoglobin A1c was 6.6. Transthoracic echo showed normal ejection fraction. Patient was started on Pradaxa for his known history of atrial fibrillation for stroke prevention. He was discharged home to the care of his wife and has been getting outpatient home physical and occupational therapy. He walks very  little with the help of therapist with the waistband. He spends most of his time in the wheelchair. He also has advanced dementia at baseline. He has never been on medications like Aricept or Namenda as per the family. He's noticed a slight to cognitive worsening since this stroke. He is tolerating Pradaxa without bleeding or bruising. Blood pressure well controlled today it is 138/79. I had a long discussion with the family with regards to treatment options for dementia and recommend a trial of Aricept if he can tolerate it  ROS:   14 system review of systems is positive for  cough, constipation, frequency of urination, headache, speech difficulty, weakness, walking difficulty, skin wounds and all other systems negative PMH:  Past Medical History:  Diagnosis Date  . A-fib ([place])   . Arthritis   . Bilateral knee pain   . Cancer ([place]) 2012   skin cancers  . Dementia   . Diabetes mellitus without complication ([place])   . Hyperlipidemia   . Hypertension   . Pancreatitis   . Poor balance   . Prostate cancer ([place])   . Stroke [facility])     Social History:  Social History   Social History  . Marital status: Married    Spouse name: N/A  . Number of children: N/A  . Years of education: N/A   Occupational History  . Not on file.   Social History Main Topics  . Smoking status: Former Research scientist (life sciences)  . Smokeless tobacco: Never Used  . Alcohol use 1.2 oz/week    1 Cans of beer, 1 Glasses of wine per week     Comment: "a beer once in a while"  . Drug use: No  . Sexual activity: Not on file   Other Topics Concern  . Not  on file   Social History Narrative  . No narrative on file    Medications:   Current Outpatient Prescriptions on File Prior to Visit  Medication Sig Dispense Refill  . brimonidine (ALPHAGAN P) 0.1 % SOLN Place 1 drop into both eyes 2 (two) times daily.     . dabigatran (PRADAXA) 150 MG CAPS capsule Take 150 mg by mouth 2 (two) times daily.    [name] lovastatin (MEVACOR) 40 MG  tablet Take 40 mg by mouth daily.     . metFORMIN (GLUCOPHAGE) 500 MG tablet Take 500 mg by mouth daily.    . sertraline (ZOLOFT) 25 MG tablet Take 1 tablet by mouth daily.  3  . valsartan-hydrochlorothiazide (DIOVAN-HCT) 80-12.5 MG per tablet Take 1 tablet by mouth daily.     No current facility-administered medications on file prior to visit.     Allergies:  No Known Allergies  Physical Exam General: well developed, well nourished Elderly Caucasian male sitting in a wheelchair,  in no evident distress Head: head normocephalic and atraumatic.  Neck: supple with no carotid or supraclavicular bruits Cardiovascular: regular rate and rhythm, no murmurs Musculoskeletal: no deformity Skin:  no rash/petichiae Vascular:  Normal pulses all extremities Vitals:   05/23/16 1144  BP: 138/79  Pulse: (!) 51   Neurologic Exam Mental Status: Awake and fully alert. Disoriented to place and time. Recent and remote memory poor. Attention span, concentration and fund of knowledge diminished. Poor insight into his condition. Easy distractibility. Detailed Mini-Mental status exam not done. Recall 0/3.[name] Mood and affect appropriate.  Cranial Nerves: Fundoscopic exam reveals sharp disc margins. Pupils equal, briskly reactive to light. Extraocular movements full without nystagmus. Visual fields full to confrontation. Hearing intact. Facial sensation intact. Face, tongue, palate moves normally and symmetrically.  Motor: Normal bulk and tone. Normal strength in all tested extremity muscles. Diminished fine finger movements on the right. Orbits left over right upper extremity. Sensory.: intact to touch ,pinprick .position and vibratory sensation.  Coordination: Rapid alternating movements normal in all extremities. Finger-to-nose and heel-to-shin performed accurately bilaterally. Gait and Station:  Deferred as patient is full assist and did not get his walker  Reflexes: 1+ and symmetric. Toes downgoing.   NIHSS   2 Modified Rankin  4   ASSESSMENT: 82 year Caucasian male with baseline dementia who developed left internal capsule infarct in May 2017 likely from small vessel disease but has a history of atrial fibrillation.    PLAN: I had a long d/w patient about his recent stroke, risk for recurrent stroke/TIAs, personally independently reviewed imaging studies and stroke evaluation results and answered questions.Continue Pradaxa  for atrial fibrillation for secondary stroke prevention and maintain strict control of hypertension with blood pressure goal below 130/90, diabetes with hemoglobin A1c goal below 6.5% and lipids with LDL cholesterol goal below 70 mg/dL. I also recommend continuing on ongoing physical and occupational therapy. Trial of Aricept for his dementia. Start 5 mg daily with food for one month and then increase to 10 mg daily if tolerated. I have discussed possible side effects with the patient's, wife and daughter and answered questions. Return for follow-up in 3 months with nurse practitioner call earlier if necessary  Greater than 50% of time during this 25 minute visit was spent on counseling,explanation of diagnosis, planning of further management, discussion with patient and family and coordination of care Antony Contras, MD  [facility] [address] [place], [place] [address]  Phone [phone] Fax [phone] Note: This  document was prepared with digital dictation and possible smart phrase technology. Any transcriptional errors that result from this process are unintentional

## 2016-05-30 ENCOUNTER — Telehealth: Payer: Self-pay

## 2016-05-30 NOTE — Telephone Encounter (Signed)
RN spoke with patients daughter about his donepezil being approve.This is a new medication for the patient. Rn stated the PA was approve only for 30 days. The PA will end 06/25/2016. Rn stated patient will take one at night for 4 weeks and than 2 tablets daily. PTs daughter verbalized understanding.

## 2016-08-22 ENCOUNTER — Ambulatory Visit: Payer: Medicare PPO | Admitting: Nurse Practitioner

## 2016-10-01 ENCOUNTER — Inpatient Hospital Stay (HOSPITAL_COMMUNITY)
Admission: EM | Admit: 2016-10-01 | Discharge: 2016-10-10 | DRG: 101 | Disposition: A | Discharge status: Skilled Nursing Facility | Payer: Medicare PPO | Attending: Internal Medicine | Admitting: Internal Medicine

## 2016-10-01 ENCOUNTER — Emergency Department (HOSPITAL_COMMUNITY): Payer: Medicare PPO

## 2016-10-01 ENCOUNTER — Encounter (HOSPITAL_COMMUNITY): Payer: Self-pay | Admitting: Emergency Medicine

## 2016-10-01 DIAGNOSIS — I69398 Other sequelae of cerebral infarction: Secondary | ICD-10-CM

## 2016-10-01 DIAGNOSIS — K6289 Other specified diseases of anus and rectum: Secondary | ICD-10-CM | POA: Diagnosis present

## 2016-10-01 DIAGNOSIS — L8990 Pressure ulcer of unspecified site, unspecified stage: Secondary | ICD-10-CM | POA: Diagnosis present

## 2016-10-01 DIAGNOSIS — I10 Essential (primary) hypertension: Secondary | ICD-10-CM | POA: Diagnosis present

## 2016-10-01 DIAGNOSIS — R748 Abnormal levels of other serum enzymes: Secondary | ICD-10-CM | POA: Diagnosis not present

## 2016-10-01 DIAGNOSIS — R410 Disorientation, unspecified: Secondary | ICD-10-CM | POA: Diagnosis present

## 2016-10-01 DIAGNOSIS — Z6825 Body mass index (BMI) 25.0-25.9, adult: Secondary | ICD-10-CM

## 2016-10-01 DIAGNOSIS — Z8673 Personal history of transient ischemic attack (TIA), and cerebral infarction without residual deficits: Secondary | ICD-10-CM

## 2016-10-01 DIAGNOSIS — R4701 Aphasia: Secondary | ICD-10-CM | POA: Diagnosis present

## 2016-10-01 DIAGNOSIS — I482 Chronic atrial fibrillation, unspecified: Secondary | ICD-10-CM | POA: Diagnosis present

## 2016-10-01 DIAGNOSIS — Z7984 Long term (current) use of oral hypoglycemic drugs: Secondary | ICD-10-CM

## 2016-10-01 DIAGNOSIS — R000 Tachycardia, unspecified: Secondary | ICD-10-CM

## 2016-10-01 DIAGNOSIS — F0391 Unspecified dementia with behavioral disturbance: Secondary | ICD-10-CM | POA: Diagnosis not present

## 2016-10-01 DIAGNOSIS — R569 Unspecified convulsions: Secondary | ICD-10-CM

## 2016-10-01 DIAGNOSIS — I480 Paroxysmal atrial fibrillation: Secondary | ICD-10-CM | POA: Diagnosis present

## 2016-10-01 DIAGNOSIS — I119 Hypertensive heart disease without heart failure: Secondary | ICD-10-CM | POA: Diagnosis present

## 2016-10-01 DIAGNOSIS — E785 Hyperlipidemia, unspecified: Secondary | ICD-10-CM | POA: Diagnosis present

## 2016-10-01 DIAGNOSIS — K625 Hemorrhage of anus and rectum: Secondary | ICD-10-CM | POA: Diagnosis not present

## 2016-10-01 DIAGNOSIS — R001 Bradycardia, unspecified: Secondary | ICD-10-CM | POA: Diagnosis not present

## 2016-10-01 DIAGNOSIS — I959 Hypotension, unspecified: Secondary | ICD-10-CM | POA: Diagnosis present

## 2016-10-01 DIAGNOSIS — E119 Type 2 diabetes mellitus without complications: Secondary | ICD-10-CM

## 2016-10-01 DIAGNOSIS — C61 Malignant neoplasm of prostate: Secondary | ICD-10-CM | POA: Diagnosis present

## 2016-10-01 DIAGNOSIS — Z515 Encounter for palliative care: Secondary | ICD-10-CM | POA: Diagnosis not present

## 2016-10-01 DIAGNOSIS — I495 Sick sinus syndrome: Secondary | ICD-10-CM | POA: Diagnosis present

## 2016-10-01 DIAGNOSIS — Z87891 Personal history of nicotine dependence: Secondary | ICD-10-CM

## 2016-10-01 DIAGNOSIS — F03918 Unspecified dementia, unspecified severity, with other behavioral disturbance: Secondary | ICD-10-CM | POA: Diagnosis present

## 2016-10-01 DIAGNOSIS — Z66 Do not resuscitate: Secondary | ICD-10-CM | POA: Diagnosis present

## 2016-10-01 DIAGNOSIS — G9340 Encephalopathy, unspecified: Secondary | ICD-10-CM

## 2016-10-01 DIAGNOSIS — I472 Ventricular tachycardia: Secondary | ICD-10-CM

## 2016-10-01 DIAGNOSIS — F0151 Vascular dementia with behavioral disturbance: Secondary | ICD-10-CM | POA: Diagnosis present

## 2016-10-01 DIAGNOSIS — L89151 Pressure ulcer of sacral region, stage 1: Secondary | ICD-10-CM | POA: Clinically undetermined

## 2016-10-01 DIAGNOSIS — F05 Delirium due to known physiological condition: Secondary | ICD-10-CM | POA: Diagnosis present

## 2016-10-01 DIAGNOSIS — I69351 Hemiplegia and hemiparesis following cerebral infarction affecting right dominant side: Secondary | ICD-10-CM

## 2016-10-01 DIAGNOSIS — N179 Acute kidney failure, unspecified: Secondary | ICD-10-CM | POA: Diagnosis present

## 2016-10-01 DIAGNOSIS — G40909 Epilepsy, unspecified, not intractable, without status epilepticus: Secondary | ICD-10-CM

## 2016-10-01 DIAGNOSIS — Z823 Family history of stroke: Secondary | ICD-10-CM

## 2016-10-01 DIAGNOSIS — Z85828 Personal history of other malignant neoplasm of skin: Secondary | ICD-10-CM

## 2016-10-01 DIAGNOSIS — E1151 Type 2 diabetes mellitus with diabetic peripheral angiopathy without gangrene: Secondary | ICD-10-CM | POA: Diagnosis present

## 2016-10-01 DIAGNOSIS — E876 Hypokalemia: Secondary | ICD-10-CM | POA: Diagnosis not present

## 2016-10-01 DIAGNOSIS — Z7901 Long term (current) use of anticoagulants: Secondary | ICD-10-CM

## 2016-10-01 DIAGNOSIS — R627 Adult failure to thrive: Secondary | ICD-10-CM | POA: Diagnosis present

## 2016-10-01 DIAGNOSIS — I674 Hypertensive encephalopathy: Secondary | ICD-10-CM | POA: Diagnosis present

## 2016-10-01 DIAGNOSIS — R079 Chest pain, unspecified: Secondary | ICD-10-CM | POA: Diagnosis not present

## 2016-10-01 HISTORY — DX: Hypertensive heart disease without heart failure: I11.9

## 2016-10-01 HISTORY — DX: Chronic atrial fibrillation, unspecified: I48.20

## 2016-10-01 LAB — PROTIME-INR
INR: 2.44
Prothrombin Time: 26.9 seconds — ABNORMAL HIGH (ref 11.4–15.2)

## 2016-10-01 LAB — CBC
HEMATOCRIT: 41.2 % (ref 39.0–52.0)
HEMOGLOBIN: 13.8 g/dL (ref 13.0–17.0)
MCH: 32.5 pg (ref 26.0–34.0)
MCHC: 33.5 g/dL (ref 30.0–36.0)
MCV: 97.2 fL (ref 78.0–100.0)
Platelets: 164 10*3/uL (ref 150–400)
RBC: 4.24 MIL/uL (ref 4.22–5.81)
RDW: 13 % (ref 11.5–15.5)
WBC: 8.9 10*3/uL (ref 4.0–10.5)

## 2016-10-01 LAB — COMPREHENSIVE METABOLIC PANEL
ALBUMIN: 4.4 g/dL (ref 3.5–5.0)
ALK PHOS: 41 U/L (ref 38–126)
ALT: 19 U/L (ref 17–63)
ANION GAP: 20 — AB (ref 5–15)
AST: 34 U/L (ref 15–41)
BILIRUBIN TOTAL: 2.4 mg/dL — AB (ref 0.3–1.2)
BUN: 17 mg/dL (ref 6–20)
CALCIUM: 9.5 mg/dL (ref 8.9–10.3)
CO2: 16 mmol/L — ABNORMAL LOW (ref 22–32)
Chloride: 102 mmol/L (ref 101–111)
Creatinine, Ser: 1.42 mg/dL — ABNORMAL HIGH (ref 0.61–1.24)
GFR calc Af Amer: 50 mL/min — ABNORMAL LOW (ref >60)
GFR, EST NON AFRICAN AMERICAN: 43 mL/min — AB (ref >60)
GLUCOSE: 199 mg/dL — AB (ref 65–99)
Potassium: 3.6 mmol/L (ref 3.5–5.1)
Sodium: 138 mmol/L (ref 135–145)
TOTAL PROTEIN: 6.6 g/dL (ref 6.5–8.1)

## 2016-10-01 LAB — I-STAT CHEM 8, ED
BUN: 18 mg/dL (ref 6–20)
CALCIUM ION: 1.06 mmol/L — AB (ref 1.15–1.40)
CHLORIDE: 103 mmol/L (ref 101–111)
Creatinine, Ser: 1.2 mg/dL (ref 0.61–1.24)
GLUCOSE: 192 mg/dL — AB (ref 65–99)
HCT: 40 % (ref 39.0–52.0)
Hemoglobin: 13.6 g/dL (ref 13.0–17.0)
Potassium: 3.5 mmol/L (ref 3.5–5.1)
Sodium: 139 mmol/L (ref 135–145)
TCO2: 19 mmol/L (ref 0–100)

## 2016-10-01 LAB — URINALYSIS, ROUTINE W REFLEX MICROSCOPIC
BACTERIA UA: NONE SEEN
Bilirubin Urine: NEGATIVE
Glucose, UA: 50 mg/dL — AB
KETONES UR: NEGATIVE mg/dL
Leukocytes, UA: NEGATIVE
Nitrite: NEGATIVE
PROTEIN: 100 mg/dL — AB
Specific Gravity, Urine: 1.017 (ref 1.005–1.030)
pH: 5 (ref 5.0–8.0)

## 2016-10-01 LAB — DIFFERENTIAL
Basophils Absolute: 0 10*3/uL (ref 0.0–0.1)
Basophils Relative: 0 %
EOS PCT: 1 %
Eosinophils Absolute: 0.1 10*3/uL (ref 0.0–0.7)
LYMPHS ABS: 5.1 10*3/uL — AB (ref 0.7–4.0)
LYMPHS PCT: 57 %
MONO ABS: 0.5 10*3/uL (ref 0.1–1.0)
MONOS PCT: 6 %
Neutro Abs: 3.2 10*3/uL (ref 1.7–7.7)
Neutrophils Relative %: 36 %

## 2016-10-01 LAB — CBG MONITORING, ED: GLUCOSE-CAPILLARY: 179 mg/dL — AB (ref 65–99)

## 2016-10-01 LAB — APTT: aPTT: 53 seconds — ABNORMAL HIGH (ref 24–36)

## 2016-10-01 LAB — I-STAT TROPONIN, ED: Troponin i, poc: 0.03 ng/mL (ref 0.00–0.08)

## 2016-10-01 MED ORDER — LORAZEPAM 2 MG/ML IJ SOLN
1.0000 mg | Freq: Once | INTRAMUSCULAR | Status: AC
  Administered 2016-10-01: 1 mg via INTRAVENOUS
  Filled 2016-10-01: qty 1

## 2016-10-01 NOTE — ED Provider Notes (Signed)
[facility] DEPT Provider Note   CSN: [id number] Arrival date & time: 10/01/16  2045     History   Chief Complaint Chief Complaint  Patient presents with  . Code Stroke    HPI Zadarius Marn is a 82 y.o. male.  The history is provided by the EMS personnel and a relative. The history is limited by the condition of the patient. No language interpreter was used.     Patient is an 82 year old male with a past medical history of multiple strokes who presents today with a phase and subsequent seizure-like activity. Patient has no prior history of seizures. This occurred within the last 1-2 hours before arrival. Arrives as a code stroke. History is limited due to the patient's mental status. He has notable confusion and is not obeying commands.  Daughter and son-in-law arrive later stated the patient has dementia but is at least aware of surroundings and obeys commands at baseline. They do relate that the patient had seemed confused over the last week. They are not sure if he's had any clear infectious signs or symptoms including fever, cough, congestion, hematuria, dysuria.  Patient is on Pradaxa for atrial fibrillation. Daughter believes that he is compliant with his medications as noted in our chart.  Past Medical History:  Diagnosis Date  . A-fib ([place])   . Arthritis   . Bilateral knee pain   . Cancer ([place]) 2012   skin cancers  . Dementia   . Diabetes mellitus without complication ([place])   . Hyperlipidemia   . Hypertension   . Pancreatitis   . Poor balance   . Prostate cancer ([place])   . Stroke [facility])     Patient Active Problem List   Diagnosis Date Noted  . Seizure ([place]) 10/01/2016  . Acute encephalopathy 10/01/2016  . History of CVA (cerebrovascular accident)   . PAF (paroxysmal atrial fibrillation) ([place])   . Normocytic anemia   . Cerebral infarction due to unspecified mechanism   . Acute CVA (cerebrovascular accident) ([place]) 03/09/2016  . Physical debility  03/09/2016  . Essential hypertension 03/09/2016  . Diabetes mellitus type 2, noninsulin dependent ([place]) 03/09/2016  . Chronic atrial fibrillation ([place]) 03/09/2016  . DNR (do not resuscitate) 03/09/2016  . Falls 03/09/2016  . Right leg weakness 03/09/2016  . Pancreatic mass 01/25/2016  . Dementia   . Fall 02/15/2015  . Multiple fractures of ribs of left side 02/15/2015  . Traumatic pneumothorax 02/14/2015    Past Surgical History:  Procedure Laterality Date  . APPENDECTOMY    . CHOLECYSTECTOMY    . EYE SURGERY    . PANCREAS SURGERY         Home Medications    Prior to Admission medications   Medication Sig Start Date End Date Taking? Authorizing Provider  bicalutamide (CASODEX) 50 MG tablet Take 50 mg by mouth daily.   Yes Historical Provider, MD  brimonidine (ALPHAGAN P) 0.1 % SOLN Place 1 drop into both eyes 2 (two) times daily.    Yes Historical Provider, MD  dabigatran (PRADAXA) 150 MG CAPS capsule Take 150 mg by mouth 2 (two) times daily.   Yes Historical Provider, MD  lovastatin (MEVACOR) 40 MG tablet Take 40 mg by mouth daily.    Yes Historical Provider, MD  metFORMIN (GLUCOPHAGE) 500 MG tablet Take 500 mg by mouth daily.   Yes Historical Provider, MD  omeprazole (PRILOSEC) 20 MG capsule Take 20 mg by mouth daily.   Yes Historical Provider, MD  sertraline (ZOLOFT) 25  MG tablet Take 1 tablet by mouth daily. 01/03/16  Yes Historical Provider, MD  valsartan-hydrochlorothiazide (DIOVAN-HCT) 80-12.5 MG per tablet Take 1 tablet by mouth daily.   Yes Historical Provider, MD    Family History Family History  Problem Relation Age of Onset  . Stroke Father   . Colon cancer Father   . Diabetes Mellitus II Sister   . Heart attack Neg Hx   . Prostate cancer Neg Hx     Social History Social History  Substance Use Topics  . Smoking status: Former Research scientist (life sciences)  . Smokeless tobacco: Never Used  . Alcohol use 1.2 oz/week    1 Cans of beer, 1 Glasses of wine per week     Comment:  "a beer once in a while"     Allergies   Patient has no known allergies.   Review of Systems Review of Systems  Unable to perform ROS: Mental status change     Physical Exam Updated Vital Signs BP 134/97   Pulse 117   Temp 98.4 F (36.9 C)   Resp 23   Wt 82.1 kg   SpO2 94%   BMI 25.97 kg/m   Physical Exam  Constitutional: He appears distressed.  HENT:  Head: Normocephalic and atraumatic.  Eyes: Conjunctivae are normal. Right eye exhibits no discharge. Left eye exhibits no discharge.  L pupil appears larger than the R.  Neck: Normal range of motion. Neck supple.  Cardiovascular: Normal rate.  An irregularly irregular rhythm present.  Pulmonary/Chest: Effort normal and breath sounds normal. No respiratory distress.  Abdominal: Soft. He exhibits no distension.  Musculoskeletal: He exhibits no edema.  Neurological: He is disoriented. GCS eye subscore is 4. GCS verbal subscore is 2. GCS motor subscore is 4.  Skin: Skin is warm and dry. He is not diaphoretic.  Nursing note and vitals reviewed.    ED Treatments / Results  Labs (all labs ordered are listed, but only abnormal results are displayed) Labs Reviewed  PROTIME-INR - Abnormal; Notable for the following:       Result Value   Prothrombin Time 26.9 (*)    All other components within normal limits  APTT - Abnormal; Notable for the following:    aPTT 53 (*)    All other components within normal limits  DIFFERENTIAL - Abnormal; Notable for the following:    Lymphs Abs 5.1 (*)    All other components within normal limits  COMPREHENSIVE METABOLIC PANEL - Abnormal; Notable for the following:    CO2 16 (*)    Glucose, Bld 199 (*)    Creatinine, Ser 1.42 (*)    Total Bilirubin 2.4 (*)    GFR calc non Af Amer 43 (*)    GFR calc Af Amer 50 (*)    Anion gap 20 (*)    All other components within normal limits  URINALYSIS, ROUTINE W REFLEX MICROSCOPIC - Abnormal; Notable for the following:    Glucose, UA 50 (*)     Hgb urine dipstick SMALL (*)    Protein, ur 100 (*)    Squamous Epithelial / LPF 0-5 (*)    All other components within normal limits  CBG MONITORING, ED - Abnormal; Notable for the following:    Glucose-Capillary 179 (*)    All other components within normal limits  I-STAT CHEM 8, ED - Abnormal; Notable for the following:    Glucose, Bld 192 (*)    Calcium, Ion 1.06 (*)    All other components within  normal limits  CBC  I-STAT TROPOININ, ED    EKG  EKG Interpretation None       Radiology Ct Head Code Stroke W/o Cm  Result Date: 10/01/2016 CLINICAL DATA:  Code stroke. 82 year old male with slurred speech and witnessed seizure. Atrial fibrillation. Dementia. Prostate cancer. Initial encounter. EXAM: CT HEAD WITHOUT CONTRAST TECHNIQUE: Contiguous axial images were obtained from the base of the skull through the vertex without intravenous contrast. COMPARISON:  03/08/2016 head CT and brain MR. FINDINGS: Brain: No intracranial hemorrhage or CT evidence of large acute infarct. Evaluation of acute infarct limited by motion. Remote left basal ganglia/ corona radiata infarct. Chronic microvascular changes. Global atrophy. Ventricular prominence unchanged and probably atrophy rather hydrocephalus. Vascular: Vascular calcifications. Middle cerebral artery branch vessels slightly dense but without change since prior exam. Skull: No acute abnormality. Sinuses/Orbits: Post left lens replacement. Minimal exophthalmos. Visualized sinuses clear. Other: Negative. ASPECTS [facility] Stroke Program Early CT Score) - Ganglionic level infarction (caudate, lentiform nuclei, internal capsule, insula, M1-M3 cortex): 7 - Supraganglionic infarction (M4-M6 cortex): 3 Total score (0-10 with 10 being normal): 10 IMPRESSION: 1. No intracranial hemorrhage or CT evidence of large acute infarct. Evaluation limited by motion. Remote left basal ganglia/corona radiata infarct. Chronic microvascular changes. Global atrophy. 2.  ASPECTS is 10 These results were called by telephone at the time of interpretation on 10/01/2016 at 9:15 pm to Dr. Elnora Morrison , who verbally acknowledged these results. Electronically Signed   By: Genia Del M.D.   On: 10/01/2016 21:16    Procedures Procedures (including critical care time)  Medications Ordered in ED Medications  LORazepam (ATIVAN) injection 1 mg (1 mg Intravenous Given 10/01/16 2244)     Initial Impression / Assessment and Plan / ED Course  I have reviewed the triage vital signs and the nursing notes.  Pertinent labs & imaging results that were available during my care of the patient were reviewed by me and considered in my medical decision making (see chart for details).  Clinical Course     Patient is an 82 year old male who presents today as a code stroke after sudden onset aphasia and seizure like activity. She was taken immediately to the CT scanner which did not demonstrate any obvious intracranial abnormality that would be acute. Neurology was promptly at the bedside for this evaluation. No recommendation for TPA given his current presentation.  Differential would include strokelike symptoms secondary to seizure. No prior seizure and there is no obvious lesions on the brain. This could be secondary to patient's known prior history of CVA in the past. Neurology at this point recommends MRI which has been ordered and is pending. Recommends EEG and consideration of starting antiepileptic medications.  This was discussed with the family who is agreeable with this plan. Patient will be admitted to the hospitalist service under observation status. Patient remained stable here in the emergency department at the time of admission.  Final Clinical Impressions(s) / ED Diagnoses   Final diagnoses:  Seizures [facility])    New Prescriptions New Prescriptions   No medications on file     Theodosia Quay, MD 10/01/16 [place]    Elnora Morrison, MD 10/07/16 1958

## 2016-10-01 NOTE — H&P (Signed)
History and Physical    Blane Pask [id number] DOB: 1926-12-04 DOA: 10/01/2016  PCP: Osborne Casco, MD  Neurology: Leonie Man  Patient coming from: Home  Chief Complaint: Aphasia, seizure  HPI: Dustin [name] is a 82 y.o. gentleman with a history of multiple prior strokes, residual right sided weakness at baseline, HTN, HLD, DM, dementia, prostate cancer, and chronic atrial fibrillation (CHADS-Vasc score of 6, anticoagulated with Pradaxa) who presents to the ED via EMS for evaluation of transient aphasia, witnessed by his wife during dinner, followed by reported convulsions concerning for new onset seizure activity.  Per his daughter, the patient has appeared to be in his baseline state of health except for recent urinary frequency.  No dysuria.  No fever.  No falls or LOC.  No known head trauma.  No recent medication changes (stopped Aricept on his own due to perceived side effects).  ED Course: The patient has been evaluated by neurology.  EKG shows atrial fibrillation with intermittent V-paced complexes. Head CT negative for acute process.  Creatinine mildly elevated at 1.42.  Bicarb 16.  Bilirubin 2.4.  INR 2.44.  Troponin negative.  At the time of my exam, the patient is agitated and disoriented.  Ativan 1mg  IV ordered.  Review of Systems: Limited by patient's mental status changes.  Past Medical History:  Diagnosis Date  . A-fib ([place])   . Arthritis   . Bilateral knee pain   . Cancer ([place]) 2012   skin cancers  . Dementia   . Diabetes mellitus without complication ([place])   . Hyperlipidemia   . Hypertension   . Pancreatitis   . Poor balance   . Prostate cancer ([place])   . Stroke [facility])     Past Surgical History:  Procedure Laterality Date  . APPENDECTOMY    . CHOLECYSTECTOMY    . EYE SURGERY    . PANCREAS SURGERY       reports that he has quit smoking. He has never used smokeless tobacco. No EtOH or illicit drug use.  He has one daughter who is is  POA.  No Known Allergies  Family History  Problem Relation Age of Onset  . Stroke Father   . Colon cancer Father   . Diabetes Mellitus II Sister   . Heart attack Neg Hx   . Prostate cancer Neg Hx      Prior to Admission medications   Medication Sig Start Date End Date Taking? Authorizing Provider  bicalutamide (CASODEX) 50 MG tablet Take 50 mg by mouth daily.   Yes Historical Provider, MD  brimonidine (ALPHAGAN P) 0.1 % SOLN Place 1 drop into both eyes 2 (two) times daily.    Yes Historical Provider, MD  dabigatran (PRADAXA) 150 MG CAPS capsule Take 150 mg by mouth 2 (two) times daily.   Yes Historical Provider, MD  lovastatin (MEVACOR) 40 MG tablet Take 40 mg by mouth daily.    Yes Historical Provider, MD  metFORMIN (GLUCOPHAGE) 500 MG tablet Take 500 mg by mouth daily.   Yes Historical Provider, MD  omeprazole (PRILOSEC) 20 MG capsule Take 20 mg by mouth daily.   Yes Historical Provider, MD  sertraline (ZOLOFT) 25 MG tablet Take 1 tablet by mouth daily. 01/03/16  Yes Historical Provider, MD  valsartan-hydrochlorothiazide (DIOVAN-HCT) 80-12.5 MG per tablet Take 1 tablet by mouth daily.   Yes Historical Provider, MD    Physical Exam: Vitals:   10/01/16 2130 10/01/16 2215 10/01/16 2248 10/01/16 2300  BP: (!) 128/46 105/83  134/97  Pulse: 84 117    Resp: 21 23    Temp:   98.4 F (36.9 C)   SpO2: 97% 94%    Weight:          Constitutional: Restless, agitated but he is not acutely decompensating. Vitals:   10/01/16 2130 10/01/16 2215 10/01/16 2248 10/01/16 2300  BP: (!) 128/46 105/83  134/97  Pulse: 84 117    Resp: 21 23    Temp:   98.4 F (36.9 C)   SpO2: 97% 94%    Weight:       Eyes: pupils are asymmetric at baseline and sluggish, lids and conjunctivae normal ENMT: Mucous membranes are very dry.  Neck: normal appearance, supple Respiratory: clear to auscultation bilaterally, no wheezing, no crackles. Normal respiratory effort. No accessory muscle use.   Cardiovascular: Mildly tachycardic and irregular.  No murmurs / rubs / gallops. No extremity edema. 2+ pedal pulses.  GI: abdomen is soft and compressible.  No distention.  No tenderness.  No masses palpated.  Bowel sounds are present. Musculoskeletal:  No joint deformity in upper and lower extremities. Good ROM, no contractures. Normal muscle tone.  Skin: no rashes, warm and dry Neurologic: CN exam limited by mental status changes but he is moving all four extremities spontaneously and on command. Strength symmetric bilaterally. Psychiatric: He is only oriented to person.  Insight and judgement are impaired.    Labs on Admission: I have personally reviewed following labs and imaging studies  CBC:  Recent Labs Lab 10/01/16 2052 10/01/16 2058  WBC 8.9  --   NEUTROABS 3.2  --   HGB 13.8 13.6  HCT 41.2 40.0  MCV 97.2  --   PLT 164  --    Basic Metabolic Panel:  Recent Labs Lab 10/01/16 2052 10/01/16 2058  NA 138 139  K 3.6 3.5  CL 102 103  CO2 16*  --   GLUCOSE 199* 192*  BUN 17 18  CREATININE 1.42* 1.20  CALCIUM 9.5  --    GFR: Estimated Creatinine Clearance: 44.8 mL/min (by C-G formula based on SCr of 1.2 mg/dL). Liver Function Tests:  Recent Labs Lab 10/01/16 2052  AST 34  ALT 19  ALKPHOS 41  BILITOT 2.4*  PROT 6.6  ALBUMIN 4.4   Coagulation Profile:  Recent Labs Lab 10/01/16 2052  INR 2.44   CBG:  Recent Labs Lab 10/01/16 2049  GLUCAP 179*   Urine analysis: Requested in the ED.  Radiological Exams on Admission: Ct Head Code Stroke W/o Cm  Result Date: 10/01/2016 CLINICAL DATA:  Code stroke. 82 year old male with slurred speech and witnessed seizure. Atrial fibrillation. Dementia. Prostate cancer. Initial encounter. EXAM: CT HEAD WITHOUT CONTRAST TECHNIQUE: Contiguous axial images were obtained from the base of the skull through the vertex without intravenous contrast. COMPARISON:  03/08/2016 head CT and brain MR. FINDINGS: Brain: No  intracranial hemorrhage or CT evidence of large acute infarct. Evaluation of acute infarct limited by motion. Remote left basal ganglia/ corona radiata infarct. Chronic microvascular changes. Global atrophy. Ventricular prominence unchanged and probably atrophy rather hydrocephalus. Vascular: Vascular calcifications. Middle cerebral artery branch vessels slightly dense but without change since prior exam. Skull: No acute abnormality. Sinuses/Orbits: Post left lens replacement. Minimal exophthalmos. Visualized sinuses clear. Other: Negative. ASPECTS [facility] Stroke Program Early CT Score) - Ganglionic level infarction (caudate, lentiform nuclei, internal capsule, insula, M1-M3 cortex): 7 - Supraganglionic infarction (M4-M6 cortex): 3 Total score (0-10 with 10 being normal): 10 IMPRESSION: 1. No intracranial hemorrhage or  CT evidence of large acute infarct. Evaluation limited by motion. Remote left basal ganglia/corona radiata infarct. Chronic microvascular changes. Global atrophy. 2. ASPECTS is 10 These results were called by telephone at the time of interpretation on 10/01/2016 at 9:15 pm to Dr. Elnora Morrison , who verbally acknowledged these results. Electronically Signed   By: Genia Del M.D.   On: 10/01/2016 21:16    EKG: Independently reviewed. Noted above.  Assessment/Plan Principal Problem:   Seizure ([place]) Active Problems:   Dementia   Essential hypertension   Diabetes mellitus type 2, noninsulin dependent (HCC)   Chronic atrial fibrillation (HCC)   History of CVA (cerebrovascular accident)   Acute encephalopathy      Acute metabolic encephalopathy, likely triggered by acute neurological event today (acute CVA vs seizure).  We also need to rule out UTI. --Neurology consult greatly appreciated.   --MRI brain with and without contrast pending. --EEG pending --Fall precautions --Aspiration precautions --Seizure precautions --IV ativan prn for agitation or seizure activity --NPO until  bedside swallow eval.  If he fails, will need formal speech eval in the AM and we will plan to hold oral meds. --U/A pending.  Will request bladder scan to see if foley catheter is justified.  Chronic a fib --Anticoagulated with Pradaxa  Coagulopathy --Monitor INR --Monitor for signs of bleeding  HTN --Continue valsartan-HCT  HLD --Continue statin  DM --Hold metformin, sliding scale insulin coverage in the hospital  History of prostate cancer --On Casodex   DVT prophylaxis: Full anticoagulation with Pradaxa Code Status: DNR Family Communication: Patient's daughter, who is POA, present in the ED at time of admission. Disposition Plan: To be determined. Consults called: Neurology Admission status: Place in observation with telemetry monitoring.   TIME SPENT: 60 minutes   Eber Jones MD [facility] Pager [phone]  If 7PM-7AM, please contact night-coverage [email or web address] Password [facility]  10/01/2016, 11:29 PM

## 2016-10-01 NOTE — ED Notes (Signed)
Pt able to answer name, location and birthday

## 2016-10-01 NOTE — ED Triage Notes (Signed)
Pt eating dinner at home became aphasic and weak. H/s of stroke x3

## 2016-10-01 NOTE — Consult Note (Signed)
Reason for Consult: Code stroke Referring Physician: ER  Dustin [name] is an 82 y.o. male.  HPI: Sitting at the dinner table and became confused, aphasic and then was witnessed to have a GTCS with tongue bite and bowel incontinence.  No prior history of seizure.  Does have prior history of strokes.  He was on Pradaxa given a. Fib.  Reviewed CT Brain personally.  No blood.  Old left BG infarct.  Motion artifact degrades study.    Past Medical History:  Diagnosis Date  . A-fib ([place])   . Arthritis   . Bilateral knee pain   . Cancer ([place]) 2012   skin cancers  . Dementia   . Diabetes mellitus without complication ([place])   . Hyperlipidemia   . Hypertension   . Pancreatitis   . Poor balance   . Prostate cancer ([place])   . Stroke [facility])     Past Surgical History:  Procedure Laterality Date  . APPENDECTOMY    . CHOLECYSTECTOMY    . EYE SURGERY    . PANCREAS SURGERY      Family History  Problem Relation Age of Onset  . Stroke Father   . Colon cancer Father   . Diabetes Mellitus II Sister   . Heart attack Neg Hx   . Prostate cancer Neg Hx     Social History:  reports that he has quit smoking. He has never used smokeless tobacco. He reports that he drinks about 1.2 oz of alcohol per week . He reports that he does not use drugs.  Allergies: No Known Allergies  Prior to Admission medications   Medication Sig Start Date End Date Taking? Authorizing Provider  brimonidine (ALPHAGAN P) 0.1 % SOLN Place 1 drop into both eyes 2 (two) times daily.     Historical Provider, MD  dabigatran (PRADAXA) 150 MG CAPS capsule Take 150 mg by mouth 2 (two) times daily.    Historical Provider, MD  donepezil (ARICEPT) 5 MG tablet Take 1 tablet (5 mg total) by mouth at bedtime. Start one tablet daily with food x 4 weeks then two tablets daily 05/23/16   Garvin Fila, MD  lovastatin (MEVACOR) 40 MG tablet Take 40 mg by mouth daily.     Historical Provider, MD  metFORMIN (GLUCOPHAGE) 500 MG tablet  Take 500 mg by mouth daily.    Historical Provider, MD  omeprazole (PRILOSEC) 20 MG capsule Take 20 mg by mouth daily.    Historical Provider, MD  sertraline (ZOLOFT) 25 MG tablet Take 1 tablet by mouth daily. 01/03/16   Historical Provider, MD  valsartan-hydrochlorothiazide (DIOVAN-HCT) 80-12.5 MG per tablet Take 1 tablet by mouth daily.    Historical Provider, MD    Medications: Prior to Admission:  (Not in a hospital admission)  Results for orders placed or performed during the hospital encounter of 10/01/16 (from the past 48 hour(s))  CBG monitoring, ED     Status: Abnormal   Collection Time: 10/01/16  8:49 PM  Result Value Ref Range   Glucose-Capillary 179 (H) 65 - 99 mg/dL  Protime-INR     Status: Abnormal   Collection Time: 10/01/16  8:52 PM  Result Value Ref Range   Prothrombin Time 26.9 (H) 11.4 - 15.2 seconds   INR 2.44   APTT     Status: Abnormal   Collection Time: 10/01/16  8:52 PM  Result Value Ref Range   aPTT 53 (H) 24 - 36 seconds    Comment:  IF BASELINE aPTT IS ELEVATED, SUGGEST PATIENT RISK ASSESSMENT BE USED TO DETERMINE APPROPRIATE ANTICOAGULANT THERAPY.   CBC     Status: None   Collection Time: 10/01/16  8:52 PM  Result Value Ref Range   WBC 8.9 4.0 - 10.5 K/uL   RBC 4.24 4.22 - 5.81 MIL/uL   Hemoglobin 13.8 13.0 - 17.0 g/dL   HCT 41.2 39.0 - 52.0 %   MCV 97.2 78.0 - 100.0 fL   MCH 32.5 26.0 - 34.0 pg   MCHC 33.5 30.0 - 36.0 g/dL   RDW 13.0 11.5 - 15.5 %   Platelets 164 150 - 400 K/uL  Differential     Status: Abnormal   Collection Time: 10/01/16  8:52 PM  Result Value Ref Range   Neutrophils Relative % 36 %   Neutro Abs 3.2 1.7 - 7.7 K/uL   Lymphocytes Relative 57 %   Lymphs Abs 5.1 (H) 0.7 - 4.0 K/uL   Monocytes Relative 6 %   Monocytes Absolute 0.5 0.1 - 1.0 K/uL   Eosinophils Relative 1 %   Eosinophils Absolute 0.1 0.0 - 0.7 K/uL   Basophils Relative 0 %   Basophils Absolute 0.0 0.0 - 0.1 K/uL  I-stat troponin, ED     Status: None    Collection Time: 10/01/16  8:56 PM  Result Value Ref Range   Troponin i, poc 0.03 0.00 - 0.08 ng/mL   Comment 3            Comment: Due to the release kinetics of cTnI, a negative result within the first hours of the onset of symptoms does not rule out myocardial infarction with certainty. If myocardial infarction is still suspected, repeat the test at appropriate intervals.   I-Stat Chem 8, ED     Status: Abnormal   Collection Time: 10/01/16  8:58 PM  Result Value Ref Range   Sodium 139 135 - 145 mmol/L   Potassium 3.5 3.5 - 5.1 mmol/L   Chloride 103 101 - 111 mmol/L   BUN 18 6 - 20 mg/dL   Creatinine, Ser 1.20 0.61 - 1.24 mg/dL   Glucose, Bld 192 (H) 65 - 99 mg/dL   Calcium, Ion 1.06 (L) 1.15 - 1.40 mmol/L   TCO2 19 0 - 100 mmol/L   Hemoglobin 13.6 13.0 - 17.0 g/dL   HCT 40.0 39.0 - 52.0 %    Ct Head Code Stroke W/o Cm  Result Date: 10/01/2016 CLINICAL DATA:  Code stroke. 82 year old male with slurred speech and witnessed seizure. Atrial fibrillation. Dementia. Prostate cancer. Initial encounter. EXAM: CT HEAD WITHOUT CONTRAST TECHNIQUE: Contiguous axial images were obtained from the base of the skull through the vertex without intravenous contrast. COMPARISON:  03/08/2016 head CT and brain MR. FINDINGS: Brain: No intracranial hemorrhage or CT evidence of large acute infarct. Evaluation of acute infarct limited by motion. Remote left basal ganglia/ corona radiata infarct. Chronic microvascular changes. Global atrophy. Ventricular prominence unchanged and probably atrophy rather hydrocephalus. Vascular: Vascular calcifications. Middle cerebral artery branch vessels slightly dense but without change since prior exam. Skull: No acute abnormality. Sinuses/Orbits: Post left lens replacement. Minimal exophthalmos. Visualized sinuses clear. Other: Negative. ASPECTS [facility] Stroke Program Early CT Score) - Ganglionic level infarction (caudate, lentiform nuclei, internal capsule, insula,  M1-M3 cortex): 7 - Supraganglionic infarction (M4-M6 cortex): 3 Total score (0-10 with 10 being normal): 10 IMPRESSION: 1. No intracranial hemorrhage or CT evidence of large acute infarct. Evaluation limited by motion. Remote left basal ganglia/corona radiata infarct. Chronic  microvascular changes. Global atrophy. 2. ASPECTS is 10 These results were called by telephone at the time of interpretation on 10/01/2016 at 9:15 pm to Dr. Elnora Morrison , who verbally acknowledged these results. Electronically Signed   By: Genia Del M.D.   On: 10/01/2016 21:16    ROS Blood pressure (!) 147/123, pulse 105, resp. rate 22, weight 82.1 kg (181 lb), SpO2 100 %. Neurologic Examination: Awake, but confused, disoriented. Severe dysarthria Significant right sided tongue bite and blood. Left elbow laceration. Moves all extremities spontaneously and withdraws to pain. No babinski.    Assessment/Plan:  New onset GTCS with post-ictal confusion now.  Since not a stroke, code stroke was cancelled.    Etiology may be old cortical strokes(not well seen on current CT) or dementia.  His glucose was high, but not likely high enough to cause seizure.    1. MRI Brain with and without gad. 2. EEG 3. Consider AED once studies completed or can also wait until has second seizure event.  Family preference will dictate.    Rogue Jury, MD 10/01/2016, 9:29 PM

## 2016-10-02 ENCOUNTER — Observation Stay (HOSPITAL_COMMUNITY): Payer: Medicare PPO

## 2016-10-02 DIAGNOSIS — E119 Type 2 diabetes mellitus without complications: Secondary | ICD-10-CM

## 2016-10-02 DIAGNOSIS — I482 Chronic atrial fibrillation: Secondary | ICD-10-CM | POA: Diagnosis not present

## 2016-10-02 DIAGNOSIS — F0391 Unspecified dementia with behavioral disturbance: Secondary | ICD-10-CM | POA: Diagnosis not present

## 2016-10-02 DIAGNOSIS — I10 Essential (primary) hypertension: Secondary | ICD-10-CM

## 2016-10-02 DIAGNOSIS — R569 Unspecified convulsions: Principal | ICD-10-CM

## 2016-10-02 DIAGNOSIS — G9340 Encephalopathy, unspecified: Secondary | ICD-10-CM | POA: Diagnosis not present

## 2016-10-02 LAB — COMPREHENSIVE METABOLIC PANEL
ALBUMIN: 3.9 g/dL (ref 3.5–5.0)
ALT: 18 U/L (ref 17–63)
AST: 31 U/L (ref 15–41)
Alkaline Phosphatase: 34 U/L — ABNORMAL LOW (ref 38–126)
Anion gap: 9 (ref 5–15)
BUN: 14 mg/dL (ref 6–20)
CHLORIDE: 105 mmol/L (ref 101–111)
CO2: 27 mmol/L (ref 22–32)
Calcium: 8.9 mg/dL (ref 8.9–10.3)
Creatinine, Ser: 1.17 mg/dL (ref 0.61–1.24)
GFR calc Af Amer: >60 mL/min (ref >60)
GFR calc non Af Amer: 54 mL/min — ABNORMAL LOW (ref >60)
GLUCOSE: 144 mg/dL — AB (ref 65–99)
POTASSIUM: 3.3 mmol/L — AB (ref 3.5–5.1)
SODIUM: 141 mmol/L (ref 135–145)
Total Bilirubin: 2.7 mg/dL — ABNORMAL HIGH (ref 0.3–1.2)
Total Protein: 6.1 g/dL — ABNORMAL LOW (ref 6.5–8.1)

## 2016-10-02 LAB — GLUCOSE, CAPILLARY
GLUCOSE-CAPILLARY: 120 mg/dL — AB (ref 65–99)
GLUCOSE-CAPILLARY: 121 mg/dL — AB (ref 65–99)
Glucose-Capillary: 156 mg/dL — ABNORMAL HIGH (ref 65–99)
Glucose-Capillary: 151 mg/dL — ABNORMAL HIGH (ref 65–99)

## 2016-10-02 LAB — PROTIME-INR
INR: 1.77
PROTHROMBIN TIME: 20.9 s — AB (ref 11.4–15.2)

## 2016-10-02 MED ORDER — OXCARBAZEPINE 300 MG PO TABS
300.0000 mg | ORAL_TABLET | Freq: Two times a day (BID) | ORAL | Status: DC
  Administered 2016-10-02 – 2016-10-03 (×3): 300 mg via ORAL
  Filled 2016-10-02 (×3): qty 1

## 2016-10-02 MED ORDER — BRIMONIDINE TARTRATE 0.2 % OP SOLN
1.0000 [drp] | Freq: Two times a day (BID) | OPHTHALMIC | Status: DC
  Administered 2016-10-02 – 2016-10-10 (×17): 1 [drp] via OPHTHALMIC
  Filled 2016-10-02: qty 5

## 2016-10-02 MED ORDER — INSULIN ASPART 100 UNIT/ML ~~LOC~~ SOLN
0.0000 [IU] | Freq: Three times a day (TID) | SUBCUTANEOUS | Status: DC
  Administered 2016-10-02 (×2): 3 [IU] via SUBCUTANEOUS
  Administered 2016-10-03 (×2): 2 [IU] via SUBCUTANEOUS
  Administered 2016-10-04: 3 [IU] via SUBCUTANEOUS
  Administered 2016-10-05 (×2): 2 [IU] via SUBCUTANEOUS
  Administered 2016-10-06 – 2016-10-07 (×5): 3 [IU] via SUBCUTANEOUS
  Administered 2016-10-08 (×2): 2 [IU] via SUBCUTANEOUS

## 2016-10-02 MED ORDER — PRAVASTATIN SODIUM 40 MG PO TABS
40.0000 mg | ORAL_TABLET | Freq: Every day | ORAL | Status: DC
  Administered 2016-10-02 – 2016-10-07 (×5): 40 mg via ORAL
  Filled 2016-10-02 (×5): qty 1

## 2016-10-02 MED ORDER — PANTOPRAZOLE SODIUM 40 MG PO TBEC
40.0000 mg | DELAYED_RELEASE_TABLET | Freq: Every day | ORAL | Status: DC
  Administered 2016-10-02 – 2016-10-08 (×7): 40 mg via ORAL
  Filled 2016-10-02 (×7): qty 1

## 2016-10-02 MED ORDER — LORAZEPAM 2 MG/ML IJ SOLN
1.0000 mg | Freq: Once | INTRAMUSCULAR | Status: AC
  Administered 2016-10-02: 1 mg via INTRAVENOUS
  Filled 2016-10-02: qty 1

## 2016-10-02 MED ORDER — SERTRALINE HCL 25 MG PO TABS
25.0000 mg | ORAL_TABLET | Freq: Every day | ORAL | Status: DC
  Administered 2016-10-02 – 2016-10-06 (×5): 25 mg via ORAL
  Filled 2016-10-02 (×5): qty 1

## 2016-10-02 MED ORDER — ACETAMINOPHEN 160 MG/5ML PO SOLN: 650.0000 mg | ORAL | Status: DC | PRN

## 2016-10-02 MED ORDER — ACETAMINOPHEN 650 MG RE SUPP: 650.0000 mg | RECTAL | Status: DC | PRN

## 2016-10-02 MED ORDER — SODIUM CHLORIDE 0.9 % IV SOLN
INTRAVENOUS | Status: DC
  Administered 2016-10-02 – 2016-10-03 (×2): via INTRAVENOUS

## 2016-10-02 MED ORDER — BICALUTAMIDE 50 MG PO TABS
50.0000 mg | ORAL_TABLET | Freq: Every day | ORAL | Status: DC
  Administered 2016-10-02 – 2016-10-10 (×9): 50 mg via ORAL
  Filled 2016-10-02 (×9): qty 1

## 2016-10-02 MED ORDER — ACETAMINOPHEN 325 MG PO TABS
650.0000 mg | ORAL_TABLET | ORAL | Status: DC | PRN
  Administered 2016-10-02: 650 mg via ORAL
  Filled 2016-10-02: qty 2

## 2016-10-02 MED ORDER — GADOBENATE DIMEGLUMINE 529 MG/ML IV SOLN
17.0000 mL | Freq: Once | INTRAVENOUS | Status: AC | PRN
  Administered 2016-10-02: 17 mL via INTRAVENOUS

## 2016-10-02 MED ORDER — POTASSIUM CHLORIDE CRYS ER 20 MEQ PO TBCR
40.0000 meq | EXTENDED_RELEASE_TABLET | Freq: Once | ORAL | Status: AC
  Administered 2016-10-02: 40 meq via ORAL
  Filled 2016-10-02: qty 2

## 2016-10-02 MED ORDER — DABIGATRAN ETEXILATE MESYLATE 150 MG PO CAPS
150.0000 mg | ORAL_CAPSULE | Freq: Two times a day (BID) | ORAL | Status: DC
  Administered 2016-10-02 – 2016-10-07 (×11): 150 mg via ORAL
  Filled 2016-10-02 (×11): qty 1

## 2016-10-02 MED ORDER — IRBESARTAN 75 MG PO TABS
75.0000 mg | ORAL_TABLET | Freq: Every day | ORAL | Status: DC
  Administered 2016-10-02: 75 mg via ORAL
  Filled 2016-10-02: qty 1

## 2016-10-02 MED ORDER — HALOPERIDOL LACTATE 5 MG/ML IJ SOLN
5.0000 mg | Freq: Once | INTRAMUSCULAR | Status: AC
  Administered 2016-10-02: 5 mg via INTRAVENOUS
  Filled 2016-10-02: qty 1

## 2016-10-02 MED ORDER — HYDROCHLOROTHIAZIDE 12.5 MG PO CAPS
12.5000 mg | ORAL_CAPSULE | Freq: Every day | ORAL | Status: DC
  Administered 2016-10-02: 12.5 mg via ORAL
  Filled 2016-10-02: qty 1

## 2016-10-02 MED ORDER — VALSARTAN-HYDROCHLOROTHIAZIDE 80-12.5 MG PO TABS: 1.0000 | ORAL_TABLET | Freq: Every day | ORAL | Status: DC

## 2016-10-02 NOTE — Progress Notes (Signed)
SLP Cancellation Note  Patient Details Name: Dustin [name] MRN: [id number] DOB: [birth year]   Cancelled treatment:       Reason Eval/Treat Not Completed: Fatigue/lethargy limiting ability to participate   Lynann Beaver 10/02/2016, 12:58 PM

## 2016-10-02 NOTE — Progress Notes (Signed)
EEG Completed; Results Pending  

## 2016-10-02 NOTE — Progress Notes (Signed)
PT Cancellation Note  Patient Details Name: Dustin [name] MRN: [id number] DOB: Dec 02, 1926   Cancelled Treatment:    Reason Eval/Treat Not Completed: Fatigue/lethargy limiting ability to participate;Patient's level of consciousness (Lethargic, agitated from lack of sleep).  Pt is confused but pulses are better 74-79.  Will try later as time and pt allow.  PLOF per daughter is that he did wc to commode or bed transfers but was asking his wife for more help such as with clothing after finishing in [place].   Ramond Dial 10/02/2016, 9:59 AM   Mee Hives, PT MS Acute Rehab Dept. Number: [place] and [place]

## 2016-10-02 NOTE — Procedures (Signed)
ELECTROENCEPHALOGRAM REPORT  Date of Study: 10/02/2016  Patient's Name: Dustin [name] MRN: [id number] Date of Birth: 1926/11/17  Referring Provider: Dr. Rogue Jury  Clinical History: This is an 82 year old man with witnessed seizure.  Medications: acetaminophen (TYLENOL) tablet 650 mg  bicalutamide (CASODEX) tablet 50 mg  dabigatran (PRADAXA) capsule 150 mg  hydrochlorothiazide (MICROZIDE) capsule 12.5 mg  insulin aspart (novoLOG) injection 0-15 Units  irbesartan (AVAPRO) tablet 75 mg  pantoprazole (PROTONIX) EC tablet 40 mg  pravastatin (PRAVACHOL) tablet 40 mg  sertraline (ZOLOFT) tablet 25 mg   Technical Summary: A multichannel digital EEG recording measured by the international 10-20 system with electrodes applied with paste and impedances below 5000 ohms performed as portable with EKG monitoring in an asleep and unresponsive patient.  Hyperventilation and photic stimulation were not performed.  The digital EEG was referentially recorded, reformatted, and digitally filtered in a variety of bipolar and referential montages for optimal display.   Description: The patient is unresponsive during the recording. There is no clear posterior dominant rhythm. The background consists of very low voltage diffuse theta and delta slowing. During drowsiness and sleep, there is an increase in theta and delta slowing, with poorly formed vertex waves and sleep spindles occasionally seen. Hyperventilation and photic stimulation were not performed.   There were no epileptiform discharges or electrographic seizures seen.    EKG lead showed irregular rhythm.  Impression: This EEG is abnormal due to diffuse low voltage slowing of the background.  Clinical Correlation of the above findings indicates diffuse cerebral dysfunction that is non-specific in etiology and can be seen with hypoxic/ischemic injury, toxic/metabolic encephalopathies, neurodegenerative disorders, or medication effect.   The absence of epileptiform discharges does not rule out a clinical diagnosis of epilepsy.  Clinical correlation is advised.   Ellouise Newer, M.D.

## 2016-10-02 NOTE — Progress Notes (Signed)
Daughter Dustin [name] called and further admission questions obtained via phone call.

## 2016-10-02 NOTE — Progress Notes (Addendum)
EEG resulted: Abnormal study due to diffuse low voltage slowing of the background. The findings are consistent with diffuse cerebral dysfunction that is non-specific in etiology and can be seen with hypoxic/ischemic injury, toxic/metabolic encephalopathies, neurodegenerative disorders, or medication effect.  The absence of epileptiform discharges does not rule out a clinical diagnosis of epilepsy.    CT images personally reviewed. Given the degree of cerebral atrophy and his diagnosis of dementia, there is increased susceptibility for development of seizures.    MRI is pending.   BP 108/67 (BP Location: Right Arm)   Pulse 61   Temp 97.4 F (36.3 C) (Oral)   Resp 16   Wt 82.1 kg (181 lb)   SpO2 100%   BMI 25.97 kg/m    Assessment/Recommendations: 1. Initiation of an anticonvulsant is indicated given no acute inciting factors for his seizure based upon the work up.  2. A good choice for this patient would be Vimpat 100 mg BID either IV or PO. Another good option would be oxcarbazepine 300 mg po BID.  3. Follow up with [facility] is also recommended.  4. Await results of MRI brain. Most likely will not change management recommendations above, but will be useful to assess for possible underlying focal epileptogenic lesion.   Electronically signed: Dr. Kerney Elbe

## 2016-10-02 NOTE — Progress Notes (Addendum)
PROGRESS NOTE    Dustin [name]  [id number] DOB: November 27, 1926 DOA: 10/01/2016 PCP: Osborne Casco, MD   Chief Complaint  Patient presents with  . Code Stroke    Brief Narrative:  82 y.o. gentleman with a history of multiple prior strokes, residual right sided weakness at baseline, HTN, HLD, DM, dementia, prostate cancer, and chronic atrial fibrillation (CHADS-Vasc score of 6, anticoagulated with Pradaxa) who presents to the ED via EMS for evaluation of transient aphasia, witnessed by his wife during dinner, followed by reported convulsions concerning for new onset seizure activity. Assessment & Plan   Acute metabolic encephalopathy/New onset seizure -Currently unknown etiology -CT head: no intracranial hemorrhage or CT evidence of large acute infarct -Pending MRI -Neurology consulted and appreciated, recommended Vimpat or oxcarbazepine -EEG: abnormal due to diffuse low voltage slowing of the background -UA unremarkable for infection -CXR not obtained on admission. No complaints of shortness of breath or cough. -Continue aspiration and seizure precautions -PT consulted  Chronic Atrial fibrillation -CHADSVASC at least 4 (age, HTN, DM) -Continue Pradaxa  Essential Hypertension -Hold valsartan-HCT, rule out stroke (MRI pending).  Will allow for permissive HTN.  Hyperlipidemia -Continue statin  Diabetes mellitus, type II -Metformin held -Continue ISS and CBG monitoring   History of prostate cancer -On Casodex  Hypokalemia -Replace and continue to monitor BMP  DVT Prophylaxis  Pradaxa  Code Status: DNR  Family Communication: Family at bedside  Disposition Plan: Currently in observation.   Consultants Neurology  Procedures  EEG  Antibiotics   Anti-infectives    None      Subjective:   Devell Brinkerhoff seen and examined today.  Currently sleeping. Had restless night according to family. Denies chest pain, shortness of breath, abdominal  pain.    Objective:   Vitals:   10/02/16 0648 10/02/16 0815 10/02/16 1014 10/02/16 1218  BP: 122/86 (!) 153/109 97/60 108/67  Pulse: (!) 124 (!) 140 (!) 59 61  Resp: 18  16   Temp: 97.9 F (36.6 C) 97.4 F (36.3 C)  97.4 F (36.3 C)  TempSrc: Axillary Oral  Oral  SpO2: 98% (!) 87% 100% 100%  Weight:        Intake/Output Summary (Last 24 hours) at 10/02/16 1447 Last data filed at 10/02/16 0848  Gross per 24 hour  Intake           285.83 ml  Output                0 ml  Net           285.83 ml   Filed Weights   10/01/16 2108  Weight: 82.1 kg (181 lb)    Exam  General: Well developed, well nourished, NAD, appears stated age  HEENT: NCAT, mucous membranes moist.   Cardiovascular: S1 S2 auscultated, no rubs, murmurs or gallops. IRR  Respiratory: Clear to auscultation bilaterally with equal chest rise  Abdomen: Soft, nontender, nondistended, + bowel sounds  Extremities: warm dry without cyanosis clubbing or edema  Neuro: Somnolent, exam limited by current condition, moves extremities spontaneously    Data Reviewed: I have personally reviewed following labs and imaging studies  CBC:  Recent Labs Lab 10/01/16 2052 10/01/16 2058  WBC 8.9  --   NEUTROABS 3.2  --   HGB 13.8 13.6  HCT 41.2 40.0  MCV 97.2  --   PLT 164  --    Basic Metabolic Panel:  Recent Labs Lab 10/01/16 2052 10/01/16 2058 10/02/16 0723  NA 138 139 141  K 3.6 3.5 3.3*  CL 102 103 105  CO2 16*  --  27  GLUCOSE 199* 192* 144*  BUN 17 18 14   CREATININE 1.42* 1.20 1.17  CALCIUM 9.5  --  8.9   GFR: Estimated Creatinine Clearance: 45.9 mL/min (by C-G formula based on SCr of 1.17 mg/dL). Liver Function Tests:  Recent Labs Lab 10/01/16 2052 10/02/16 0723  AST 34 31  ALT 19 18  ALKPHOS 41 34*  BILITOT 2.4* 2.7*  PROT 6.6 6.1*  ALBUMIN 4.4 3.9   No results for input(s): LIPASE, AMYLASE in the last 168 hours. No results for input(s): AMMONIA in the last 168 hours. Coagulation  Profile:  Recent Labs Lab 10/01/16 2052 10/02/16 0723  INR 2.44 1.77   Cardiac Enzymes: No results for input(s): CKTOTAL, CKMB, CKMBINDEX, TROPONINI in the last 168 hours. BNP (last 3 results) No results for input(s): PROBNP in the last 8760 hours. HbA1C: No results for input(s): HGBA1C in the last 72 hours. CBG:  Recent Labs Lab 10/01/16 2049 10/02/16 0823 10/02/16 1150  GLUCAP 179* 156* 151*   Lipid Profile: No results for input(s): CHOL, HDL, LDLCALC, TRIG, CHOLHDL, LDLDIRECT in the last 72 hours. Thyroid Function Tests: No results for input(s): TSH, T4TOTAL, FREET4, T3FREE, THYROIDAB in the last 72 hours. Anemia Panel: No results for input(s): VITAMINB12, FOLATE, FERRITIN, TIBC, IRON, RETICCTPCT in the last 72 hours. Urine analysis:    Component Value Date/Time   COLORURINE YELLOW 10/01/2016 2321   APPEARANCEUR CLEAR 10/01/2016 2321   LABSPEC 1.017 10/01/2016 2321   PHURINE 5.0 10/01/2016 2321   GLUCOSEU 50 (A) 10/01/2016 2321   HGBUR SMALL (A) 10/01/2016 2321   BILIRUBINUR NEGATIVE 10/01/2016 2321   KETONESUR NEGATIVE 10/01/2016 2321   PROTEINUR 100 (A) 10/01/2016 2321   UROBILINOGEN 2.0 (H) 02/16/2015 1143   NITRITE NEGATIVE 10/01/2016 2321   LEUKOCYTESUR NEGATIVE 10/01/2016 2321   Sepsis Labs: @LABRCNTIP (procalcitonin:4,lacticidven:4)  )No results found for this or any previous visit (from the past 240 hour(s)).    Radiology Studies: Ct Head Code Stroke W/o Cm  Result Date: 10/01/2016 CLINICAL DATA:  Code stroke. 82 year old male with slurred speech and witnessed seizure. Atrial fibrillation. Dementia. Prostate cancer. Initial encounter. EXAM: CT HEAD WITHOUT CONTRAST TECHNIQUE: Contiguous axial images were obtained from the base of the skull through the vertex without intravenous contrast. COMPARISON:  03/08/2016 head CT and brain MR. FINDINGS: Brain: No intracranial hemorrhage or CT evidence of large acute infarct. Evaluation of acute infarct limited by  motion. Remote left basal ganglia/ corona radiata infarct. Chronic microvascular changes. Global atrophy. Ventricular prominence unchanged and probably atrophy rather hydrocephalus. Vascular: Vascular calcifications. Middle cerebral artery branch vessels slightly dense but without change since prior exam. Skull: No acute abnormality. Sinuses/Orbits: Post left lens replacement. Minimal exophthalmos. Visualized sinuses clear. Other: Negative. ASPECTS [facility] Stroke Program Early CT Score) - Ganglionic level infarction (caudate, lentiform nuclei, internal capsule, insula, M1-M3 cortex): 7 - Supraganglionic infarction (M4-M6 cortex): 3 Total score (0-10 with 10 being normal): 10 IMPRESSION: 1. No intracranial hemorrhage or CT evidence of large acute infarct. Evaluation limited by motion. Remote left basal ganglia/corona radiata infarct. Chronic microvascular changes. Global atrophy. 2. ASPECTS is 10 These results were called by telephone at the time of interpretation on 10/01/2016 at 9:15 pm to Dr. Elnora Morrison , who verbally acknowledged these results. Electronically Signed   By: Genia Del M.D.   On: 10/01/2016 21:16     Scheduled Meds: . bicalutamide  50 mg Oral Daily  .  brimonidine  1 drop Both Eyes BID  . dabigatran  150 mg Oral BID  . irbesartan  75 mg Oral Daily   And  . hydrochlorothiazide  12.5 mg Oral Daily  . insulin aspart  0-15 Units Subcutaneous TID WC  . pantoprazole  40 mg Oral Daily  . pravastatin  40 mg Oral q1800  . sertraline  25 mg Oral Daily   Continuous Infusions: . sodium chloride 50 mL/hr at 10/02/16 0117     LOS: 0 days   Time Spent in minutes   30 minutes  Kayloni Rocco D.O. on 10/02/2016 at 2:47 PM  Between 7am to 7pm - Pager - [phone]  After 7pm go to [email or web address] - password [email or web address]  And look for the night coverage person covering for me after hours  [facility] Office  [phone]

## 2016-10-02 NOTE — Care Management Note (Addendum)
Case Management Note  Patient Details  Name: Dustin [name] MRN: [id number] Date of Birth: 30-Sep-1927  Subjective/Objective:                 Patient from home with 82 year old wife as primary caretaker. New seizure like activity. Neuro consult. Benefit check sent for Vimpat cost. Patient continues to be confused and disoriented, sitter at bedside.  12/27 Sitter continues at bedside for AMS. Spoke with patient's wife and daughter Angela Nevin. They state patient has been to [facility] twice for CVA and skilled PT. Last time was around 3 months ago. They would like pt to go to SNF, preferably not [facility]. CM updated [place] CSW. Family also provided with Private Duty [facility]HA list. They state they have used [facility] after SNF DC's and would not mind using them again if needed.    Action/Plan:  CM will continue to follow. DC SNF 1/3  Expected Discharge Date:  10/02/16               Expected Discharge Plan:  [place]  In-House Referral:     Discharge planning Services  CM Consult  Post Acute Care Choice:    Choice offered to:     DME Arranged:    DME Agency:     HH Arranged:    HH Agency:     Status of Service:  In process, will continue to follow  If discussed at Long Length of Stay Meetings, dates discussed:    Additional Comments:  Carles Collet, RN 10/02/2016, 3:51 PM

## 2016-10-02 NOTE — Progress Notes (Signed)
PT. Admitted to room 5  [address]. Pt alert to person and place only, he was able to answer some admission questions, but not all. No family at bedside. Pt is jittery and pulling on his telemetry wires and gown. He has attempted to  To get OOB will follow commands, but remain confused and needs constant redirecting.VSS, skin tear noted to left elbow foam placed to area.I.V s patent. Will continue to monitor.Bed alarm on safety maintained.

## 2016-10-03 DIAGNOSIS — I119 Hypertensive heart disease without heart failure: Secondary | ICD-10-CM | POA: Diagnosis present

## 2016-10-03 DIAGNOSIS — Z515 Encounter for palliative care: Secondary | ICD-10-CM | POA: Diagnosis not present

## 2016-10-03 DIAGNOSIS — I480 Paroxysmal atrial fibrillation: Secondary | ICD-10-CM | POA: Diagnosis present

## 2016-10-03 DIAGNOSIS — R079 Chest pain, unspecified: Secondary | ICD-10-CM | POA: Diagnosis not present

## 2016-10-03 DIAGNOSIS — I959 Hypotension, unspecified: Secondary | ICD-10-CM | POA: Diagnosis present

## 2016-10-03 DIAGNOSIS — Z66 Do not resuscitate: Secondary | ICD-10-CM | POA: Diagnosis present

## 2016-10-03 DIAGNOSIS — L89151 Pressure ulcer of sacral region, stage 1: Secondary | ICD-10-CM | POA: Diagnosis not present

## 2016-10-03 DIAGNOSIS — R4701 Aphasia: Secondary | ICD-10-CM | POA: Diagnosis present

## 2016-10-03 DIAGNOSIS — E1151 Type 2 diabetes mellitus with diabetic peripheral angiopathy without gangrene: Secondary | ICD-10-CM | POA: Diagnosis present

## 2016-10-03 DIAGNOSIS — F0151 Vascular dementia with behavioral disturbance: Secondary | ICD-10-CM | POA: Diagnosis present

## 2016-10-03 DIAGNOSIS — I482 Chronic atrial fibrillation: Secondary | ICD-10-CM | POA: Diagnosis present

## 2016-10-03 DIAGNOSIS — I674 Hypertensive encephalopathy: Secondary | ICD-10-CM

## 2016-10-03 DIAGNOSIS — E119 Type 2 diabetes mellitus without complications: Secondary | ICD-10-CM | POA: Diagnosis not present

## 2016-10-03 DIAGNOSIS — E785 Hyperlipidemia, unspecified: Secondary | ICD-10-CM | POA: Diagnosis present

## 2016-10-03 DIAGNOSIS — F05 Delirium due to known physiological condition: Secondary | ICD-10-CM | POA: Diagnosis present

## 2016-10-03 DIAGNOSIS — K6289 Other specified diseases of anus and rectum: Secondary | ICD-10-CM | POA: Diagnosis present

## 2016-10-03 DIAGNOSIS — R001 Bradycardia, unspecified: Secondary | ICD-10-CM | POA: Diagnosis not present

## 2016-10-03 DIAGNOSIS — I69351 Hemiplegia and hemiparesis following cerebral infarction affecting right dominant side: Secondary | ICD-10-CM | POA: Diagnosis not present

## 2016-10-03 DIAGNOSIS — R627 Adult failure to thrive: Secondary | ICD-10-CM | POA: Diagnosis present

## 2016-10-03 DIAGNOSIS — K625 Hemorrhage of anus and rectum: Secondary | ICD-10-CM | POA: Diagnosis not present

## 2016-10-03 DIAGNOSIS — R748 Abnormal levels of other serum enzymes: Secondary | ICD-10-CM | POA: Diagnosis not present

## 2016-10-03 DIAGNOSIS — F0391 Unspecified dementia with behavioral disturbance: Secondary | ICD-10-CM | POA: Diagnosis not present

## 2016-10-03 DIAGNOSIS — E876 Hypokalemia: Secondary | ICD-10-CM | POA: Diagnosis not present

## 2016-10-03 DIAGNOSIS — R569 Unspecified convulsions: Secondary | ICD-10-CM | POA: Diagnosis present

## 2016-10-03 DIAGNOSIS — I495 Sick sinus syndrome: Secondary | ICD-10-CM | POA: Diagnosis present

## 2016-10-03 DIAGNOSIS — R410 Disorientation, unspecified: Secondary | ICD-10-CM | POA: Diagnosis not present

## 2016-10-03 DIAGNOSIS — Z8673 Personal history of transient ischemic attack (TIA), and cerebral infarction without residual deficits: Secondary | ICD-10-CM | POA: Diagnosis not present

## 2016-10-03 DIAGNOSIS — G9340 Encephalopathy, unspecified: Secondary | ICD-10-CM | POA: Diagnosis not present

## 2016-10-03 DIAGNOSIS — C61 Malignant neoplasm of prostate: Secondary | ICD-10-CM | POA: Diagnosis present

## 2016-10-03 DIAGNOSIS — N179 Acute kidney failure, unspecified: Secondary | ICD-10-CM | POA: Diagnosis present

## 2016-10-03 LAB — BASIC METABOLIC PANEL
ANION GAP: 8 (ref 5–15)
BUN: 17 mg/dL (ref 6–20)
CHLORIDE: 104 mmol/L (ref 101–111)
CO2: 27 mmol/L (ref 22–32)
CREATININE: 1.17 mg/dL (ref 0.61–1.24)
Calcium: 8.9 mg/dL (ref 8.9–10.3)
GFR calc non Af Amer: 54 mL/min — ABNORMAL LOW (ref >60)
Glucose, Bld: 118 mg/dL — ABNORMAL HIGH (ref 65–99)
POTASSIUM: 3.7 mmol/L (ref 3.5–5.1)
Sodium: 139 mmol/L (ref 135–145)

## 2016-10-03 LAB — GLUCOSE, CAPILLARY
GLUCOSE-CAPILLARY: 136 mg/dL — AB (ref 65–99)
GLUCOSE-CAPILLARY: 146 mg/dL — AB (ref 65–99)
Glucose-Capillary: 118 mg/dL — ABNORMAL HIGH (ref 65–99)
Glucose-Capillary: 142 mg/dL — ABNORMAL HIGH (ref 65–99)

## 2016-10-03 LAB — CBC
HEMATOCRIT: 34.4 % — AB (ref 39.0–52.0)
HEMOGLOBIN: 11.6 g/dL — AB (ref 13.0–17.0)
MCH: 32 pg (ref 26.0–34.0)
MCHC: 33.7 g/dL (ref 30.0–36.0)
MCV: 94.8 fL (ref 78.0–100.0)
Platelets: 136 10*3/uL — ABNORMAL LOW (ref 150–400)
RBC: 3.63 MIL/uL — ABNORMAL LOW (ref 4.22–5.81)
RDW: 13.1 % (ref 11.5–15.5)
WBC: 6.1 10*3/uL (ref 4.0–10.5)

## 2016-10-03 MED ORDER — ORAL CARE MOUTH RINSE
15.0000 mL | Freq: Two times a day (BID) | OROMUCOSAL | Status: DC
  Administered 2016-10-03 – 2016-10-10 (×15): 15 mL via OROMUCOSAL

## 2016-10-03 MED ORDER — LACOSAMIDE 50 MG PO TABS
100.0000 mg | ORAL_TABLET | Freq: Two times a day (BID) | ORAL | Status: DC
  Administered 2016-10-04 – 2016-10-10 (×13): 100 mg via ORAL
  Filled 2016-10-03 (×13): qty 2

## 2016-10-03 MED ORDER — SODIUM CHLORIDE 0.9 % IV SOLN
200.0000 mg | Freq: Once | INTRAVENOUS | Status: AC
  Administered 2016-10-03: 200 mg via INTRAVENOUS
  Filled 2016-10-03: qty 20

## 2016-10-03 MED ORDER — LACOSAMIDE 50 MG PO TABS: 100.0000 mg | ORAL_TABLET | Freq: Two times a day (BID) | ORAL | Status: DC

## 2016-10-03 NOTE — Evaluation (Signed)
Speech Language Pathology Evaluation Patient Details Name: Dustin [name] MRN: [id number] DOB: May 13, 1927 Today's Date: 10/03/2016 Time: [id number] SLP Time Calculation (min) (ACUTE ONLY): 34 min  Problem List:  Patient Active Problem List   Diagnosis Date Noted  . Seizures ([place])   . Seizure ([place]) 10/01/2016  . Acute encephalopathy 10/01/2016  . History of CVA (cerebrovascular accident)   . PAF (paroxysmal atrial fibrillation) ([place])   . Normocytic anemia   . Cerebral infarction due to unspecified mechanism   . Acute CVA (cerebrovascular accident) ([place]) 03/09/2016  . Physical debility 03/09/2016  . Essential hypertension 03/09/2016  . Diabetes mellitus type 2, noninsulin dependent ([place]) 03/09/2016  . Chronic atrial fibrillation ([place]) 03/09/2016  . DNR (do not resuscitate) 03/09/2016  . Falls 03/09/2016  . Right leg weakness 03/09/2016  . Pancreatic mass 01/25/2016  . Dementia   . Fall 02/15/2015  . Multiple fractures of ribs of left side 02/15/2015  . Traumatic pneumothorax 02/14/2015   Past Medical History:  Past Medical History:  Diagnosis Date  . A-fib ([place])   . Arthritis   . Bilateral knee pain   . Cancer ([place]) 2012   skin cancers  . Dementia   . Diabetes mellitus without complication ([place])   . Hyperlipidemia   . Hypertension   . Pancreatitis   . Poor balance   . Prostate cancer ([place])   . Stroke [facility])    Past Surgical History:  Past Surgical History:  Procedure Laterality Date  . APPENDECTOMY    . CHOLECYSTECTOMY    . EYE SURGERY    . PANCREAS SURGERY     HPI:  Dustin [name] a 82 y.o.gentleman with a history of multiple prior strokes, residual right sided weakness at baseline, HTN, HLD, DM, dementia, prostate cancer, and chronic atrial fibrillation who presented to the ED via EMS for evaluation of transient aphasia, witnessed by his wife during dinner, followed by reported convulsions concerning for new onset seizure activity. MRI 10/02/16  findings of atrophy and small vessel disease. 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 No acute intracranial findings.   Assessment / Plan / Recommendation Clinical Impression  Patient presents with severe cognitive communication impairment with deficits in orientation, short term recall, long-term memory, problem solving and safety awareness. Patient has a diagnosis of dementia and per daughter, had "memory problems" at baseline that have "gotten worse" since his admission. Daughter states she would like patient to return home with [facility] and is considering hiring a personal attendant on PRN basis. Provided education regarding impact of cognitive communication deficits on overall safety in the setting of functional changes in mobility. SLP to f/u to provide additional pt/family education regarding safe transition from acute setting.    SLP Assessment  Patient needs continued Speech Lanaguage Pathology Services    Follow Up Recommendations  24 hour supervision/assistance    Frequency and Duration min 1 x/week  2 weeks      SLP Evaluation Cognition  Overall Cognitive Status: History of cognitive impairments - at baseline Arousal/Alertness: Awake/alert Orientation Level: Oriented to person;Oriented to place;Disoriented to time;Disoriented to situation Memory: Impaired Memory Impairment: Decreased long term memory;Decreased short term memory Decreased Long Term Memory: Functional basic Decreased Short Term Memory: Verbal basic;Functional basic Problem Solving: Impaired Problem Solving Impairment: Functional basic Safety/Judgment: Impaired       Comprehension  Auditory Comprehension Overall Auditory Comprehension: Appears within functional limits for tasks assessed Visual Recognition/Discrimination Discrimination: Not tested Reading Comprehension Reading Status: Not tested    Expression Expression Primary Mode of Expression:  Verbal Verbal Expression Overall Verbal  Expression: Appears within functional limits for tasks assessed Written Expression Dominant Hand: Right Written Expression: Exceptions to [facility] Self Formulation Ability: Letter   Oral / Motor  Oral Motor/Sensory Function Overall Oral Motor/Sensory Function: Within functional limits Motor Speech Overall Motor Speech: Appears within functional limits for tasks assessed   GO          Functional Assessment Tool Used: skilled clinical observation Functional Limitations: Memory Memory Current Status [id number]): At least 80 percent but less than 100 percent impaired, limited or restricted Memory Goal Status [id number]): At least 80 percent but less than 100 percent impaired, limited or restricted Memory Discharge Status [phone]): At least 80 percent but less than 100 percent impaired, limited or restricted         Deneise Lever, MS CF-SLP Speech-Language Pathologist  Aliene Altes 10/03/2016, 4:18 PM

## 2016-10-03 NOTE — Progress Notes (Signed)
MRI resulted. Official report and images personally reviewed. Atrophy and small vessel disease are noted. Findings are suggestive of longstanding hypertensive encephalopathy. There are no acute intracranial findings. There is no focal epileptogenic lesion seen. However, diffuse cortical atrophy is noted, which appears most likely secondary to an underlying degenerative dementing process and would present an increased risk of seizures.   Now on Oxcarbazepine. Will need outpatient Neurology follow up.   Thank you for giving [place] the opportunity to participate in the care of this patient. Please call with further questions.   Electronically signed: Dr. Kerney Elbe

## 2016-10-03 NOTE — Evaluation (Signed)
Physical Therapy Evaluation Patient Details Name: Dustin [name] MRN: [id number] DOB: Jan 05, 1927 Today's Date: 10/03/2016   History of Present Illness  82 y.o.gentleman with a history of multiple prior strokes, residual right sided weakness at baseline, HTN, HLD, DM, dementia, prostate cancer, and chronic atrial fibrillation who presents to the ED via EMS for evaluation of transient aphasia, witnessed by his wife during dinner, followed by reported convulsions concerning for new onset seizure activity.   Clinical Impression  Pt is up in chair with PT and OT seeing together due to his level of debility and behavior.  Will plan to follow acutely as he is expected to return home after rehab is complete, and will need to increase his independence based on PLOF to achieve this goal.  Will also recommend HHPT follow after SNF care is completed.  Pt is agreeable to moving but his information about PLOF does not match information from family yesterday.    Follow Up Recommendations SNF    Equipment Recommendations  Rolling walker with 5" wheels (if he does not have equipment in good working order)    Recommendations for Other Services       Precautions / Restrictions Precautions Precautions: Fall (telemetry) Restrictions Weight Bearing Restrictions: No      Mobility  Bed Mobility Overal bed mobility: Needs Assistance Bed Mobility: Supine to Sit     Supine to sit: Min assist;HOB elevated     General bed mobility comments: assisted trunk and steadied as he was assisted to finish scooting to EOB  Transfers Overall transfer level: Needs assistance Equipment used: Rolling walker (2 wheeled) Transfers: Sit to/from [facility] Sit to Stand: Min assist;+2 physical assistance;+2 safety/equipment;From elevated surface Stand pivot transfers: Min guard;+2 physical assistance;+2 safety/equipment;From elevated surface       General transfer comment: cued hand placement and  safety with turning to use walker  Ambulation/Gait Ambulation/Gait assistance: Min assist;+2 physical assistance;+2 safety/equipment Ambulation Distance (Feet): 5 Feet Assistive device: Rolling walker (2 wheeled);2 person hand held assist Gait Pattern/deviations: Step-to pattern;Step-through pattern;Decreased stride length;Trunk flexed;Wide base of support;Shuffle Gait velocity: reduced Gait velocity interpretation: Below normal speed for age/gender General Gait Details: limited control of steps with pt but can move on walker, although family used WC often  Stairs            Wheelchair Mobility    Modified [place] (Stroke Patients Only)       Balance Overall balance assessment: Needs assistance Sitting-balance support: Feet supported;Bilateral upper extremity supported Sitting balance-Leahy Scale: Fair     Standing balance support: Bilateral upper extremity supported Standing balance-Leahy Scale: Poor                               Pertinent Vitals/Pain Pain Assessment: No/denies pain Faces Pain Scale: No hurt    Home Living Family/patient expects to be discharged to:: Private residence Living Arrangements: Children;Spouse/significant other Available Help at Discharge: Family;Available 24 hours/day Type of Home: House Home Access: Level entry     Home Layout: One level Home Equipment: Walker - 2 wheels;Bedside commode Additional Comments: report from family was that pt used wc and transfers to [facility] or bed, but wife assisted with clothing and bathing recently    Prior Function Level of Independence: Needs assistance   Gait / Transfers Assistance Needed: was using WC for mobility and RW for transfers  ADL's / Homemaking Assistance Needed: wife cares for home  Comments: pt is stating  he could dress and bathe but wife reported she helped     Hand Dominance   Dominant Hand: Right    Extremity/Trunk Assessment   Upper Extremity Assessment Upper  Extremity Assessment: Generalized weakness    Lower Extremity Assessment Lower Extremity Assessment: Generalized weakness    Cervical / Trunk Assessment Cervical / Trunk Assessment: Kyphotic  Communication   Communication: Expressive difficulties  Cognition Arousal/Alertness: Awake/alert Behavior During Therapy: Flat affect Overall Cognitive Status: History of cognitive impairments - at baseline                 General Comments: Anticipate pt is at or close to baseline cognitively but no family present to confirm.    General Comments General comments (skin integrity, edema, etc.): Pt is in room with sitter who is able to supervise but put alarm in the chair in case she were to have to step to the hall momentarily     Exercises     Assessment/Plan    PT Assessment Patient needs continued PT services  PT Problem List Decreased strength;Decreased range of motion;Decreased activity tolerance;Decreased balance;Decreased mobility;Decreased coordination;Decreased cognition;Decreased knowledge of use of DME;Decreased safety awareness;Decreased knowledge of precautions;Cardiopulmonary status limiting activity          PT Treatment Interventions DME instruction;Gait training;Functional mobility training;Therapeutic activities;Therapeutic exercise;Balance training;Neuromuscular re-education;Patient/family education    PT Goals (Current goals can be found in the Care Plan section)  Acute Rehab PT Goals Patient Stated Goal: none stated PT Goal Formulation: Patient unable to participate in goal setting Time For Goal Achievement: 10/17/16 Potential to Achieve Goals: Good    Frequency Min 3X/week   Barriers to discharge Decreased caregiver support (has elderly wife)      Co-evaluation PT/OT/SLP Co-Evaluation/Treatment: Yes Reason for Co-Treatment: Necessary to address cognition/behavior during functional activity;For patient/therapist safety;To address functional/ADL  transfers PT goals addressed during session: Mobility/safety with mobility;Proper use of DME OT goals addressed during session: ADL's and self-care;Other (comment) (functional mobility)       End of Session Equipment Utilized During Treatment: Gait belt Activity Tolerance: Patient tolerated treatment well;Patient limited by fatigue Patient left: in chair;with call bell/phone within reach;with chair alarm set;with nursing/sitter in room Nurse Communication: Mobility status    Functional Assessment Tool Used: clinical judgment Functional Limitation: Mobility: Walking and moving around Mobility: Walking and Moving Around Current Status [id number]): At least 40 percent but less than 60 percent impaired, limited or restricted Mobility: Walking and Moving Around Goal Status [phone]): At least 1 percent but less than 20 percent impaired, limited or restricted    Time: 0947-1001 PT Time Calculation (min) (ACUTE ONLY): 14 min   Charges:   PT Evaluation $PT Eval Low Complexity: 1 Procedure     PT G Codes:   PT G-Codes **NOT FOR INPATIENT CLASS** Functional Assessment Tool Used: clinical judgment Functional Limitation: Mobility: Walking and moving around Mobility: Walking and Moving Around Current Status [id number]): At least 40 percent but less than 60 percent impaired, limited or restricted Mobility: Walking and Moving Around Goal Status [phone]): At least 1 percent but less than 20 percent impaired, limited or restricted    Ramond Dial 10/03/2016, 1:07 PM  Mee Hives, PT MS Acute Rehab Dept. Number: [place] and [place]

## 2016-10-03 NOTE — NC FL2 (Signed)
[place] LEVEL OF CARE SCREENING TOOL     IDENTIFICATION  Patient Name: Dustin [name] Birthdate: 1927-05-27 Sex: male Admission Date (Current Location): 10/01/2016  [facility] and [place] Number:  Herbalist and Address:  [facility] [place], [place] [address] [place], [place] [address]      Provider Number: [id number]  Attending Physician Name and Address:  Louellen Molder, MD  Relative Name and Phone Number:  Angela Nevin, daughter, [phone]    Current Level of Care: Hospital Recommended Level of Care: [place] Prior Approval Number:    Date Approved/Denied:   PASRR Number: [id number]  Discharge Plan: SNF    Current Diagnoses: Patient Active Problem List   Diagnosis Date Noted  . Seizures ([place])   . Seizure ([place]) 10/01/2016  . Acute encephalopathy 10/01/2016  . History of CVA (cerebrovascular accident)   . PAF (paroxysmal atrial fibrillation) ([place])   . Normocytic anemia   . Cerebral infarction due to unspecified mechanism   . Acute CVA (cerebrovascular accident) ([place]) 03/09/2016  . Physical debility 03/09/2016  . Essential hypertension 03/09/2016  . Diabetes mellitus type 2, noninsulin dependent ([place]) 03/09/2016  . Chronic atrial fibrillation ([place]) 03/09/2016  . DNR (do not resuscitate) 03/09/2016  . Falls 03/09/2016  . Right leg weakness 03/09/2016  . Pancreatic mass 01/25/2016  . Dementia   . Fall 02/15/2015  . Multiple fractures of ribs of left side 02/15/2015  . Traumatic pneumothorax 02/14/2015    Orientation RESPIRATION BLADDER Height & Weight     Self, Place  O2 (Nasal cannula 2L) Continent Weight: 82.1 kg (181 lb) Height:     BEHAVIORAL SYMPTOMS/MOOD NEUROLOGICAL BOWEL NUTRITION STATUS      Continent Diet (Please see DC Summary)  AMBULATORY STATUS COMMUNICATION OF NEEDS Skin   Extensive Assist Verbally Normal                       Personal Care Assistance Level of Assistance   Bathing, Feeding, Dressing Bathing Assistance: Limited assistance Feeding assistance: Limited assistance Dressing Assistance: Limited assistance     Functional Limitations Info             SPECIAL CARE FACTORS FREQUENCY  PT (By licensed PT)     PT Frequency: 5x/week              Contractures      Additional Factors Info  Code Status, Allergies, Psychotropic, Insulin Sliding Scale Code Status Info: DNR Allergies Info: NKA Psychotropic Info: Zoloft Insulin Sliding Scale Info: 3 times daily       Current Medications (10/03/2016):  This is the current hospital active medication list Current Facility-Administered Medications  Medication Dose Route Frequency Provider Last Rate Last Dose  . 0.9 %  sodium chloride infusion   Intravenous Continuous Lily Kocher, MD 50 mL/hr at 10/03/16 0225    . acetaminophen (TYLENOL) tablet 650 mg  650 mg Oral Q4H PRN Lily Kocher, MD   650 mg at 10/02/16 2142   Or  . acetaminophen (TYLENOL) solution 650 mg  650 mg Per Tube Q4H PRN Lily Kocher, MD       Or  . acetaminophen (TYLENOL) suppository 650 mg  650 mg Rectal Q4H PRN Lily Kocher, MD      . bicalutamide (CASODEX) tablet 50 mg  50 mg Oral Daily Lily Kocher, MD   50 mg at 10/03/16 [id number]  . brimonidine (ALPHAGAN) 0.2 % ophthalmic solution 1 drop  1 drop Both  Eyes BID Lily Kocher, MD   1 drop at 10/03/16 [id number]  . dabigatran (PRADAXA) capsule 150 mg  150 mg Oral BID Lily Kocher, MD   150 mg at 10/03/16 [id number]  . insulin aspart (novoLOG) injection 0-15 Units  0-15 Units Subcutaneous TID WC Lily Kocher, MD   2 Units at 10/03/16 1225  . lacosamide (VIMPAT) 200 mg in sodium chloride 0.9 % 25 mL IVPB  200 mg Intravenous Once Nishant Dhungel, MD      . Derrill Memo ON 10/04/2016] lacosamide (VIMPAT) tablet 100 mg  100 mg Oral BID Nishant Dhungel, MD      . MEDLINE mouth rinse  15 mL Mouth Rinse BID Maryann Mikhail, DO   15 mL at 10/03/16 1000  . pantoprazole (PROTONIX) EC tablet 40 mg  40 mg Oral  Daily Lily Kocher, MD   40 mg at 10/03/16 [id number]  . pravastatin (PRAVACHOL) tablet 40 mg  40 mg Oral q1800 Lily Kocher, MD   40 mg at 10/02/16 1841  . sertraline (ZOLOFT) tablet 25 mg  25 mg Oral Daily Lily Kocher, MD   25 mg at 10/03/16 [id number]     Discharge Medications: Please see discharge summary for a list of discharge medications.  Relevant Imaging Results:  Relevant Lab Results:   Additional Information SSN: [id number]  Benard Halsted, LCSWA

## 2016-10-03 NOTE — Progress Notes (Signed)
Benefit Check Vimpat: Jacqualin Combes, RN        S/W  [facility] @ [employer] RX # [phone]   VIMPAT ( IACROSAMIDE ) 100 MG BID 30 /60 TAB   COVER- YES  CO-PAY- $ 55.00  TIER- 3 DRUG  PRIOR APPROVAL- YES # [phone]  PHARMACY : [facility] AND [facility]

## 2016-10-03 NOTE — Clinical Social Work Note (Signed)
Clinical Social Work Assessment  Patient Details  Name: Dustin [name] MRN: [id number] Date of Birth: 04-01-1927  Date of referral:  10/03/16               Reason for consult:  Facility Placement                Permission sought to share information with:  Facility Sport and exercise psychologist, Family Supports Permission granted to share information::  No  Name::     Pharmacist, hospital::  SNFs  Relationship::  Daughter  Contact Information:  [phone]  Housing/Transportation Living arrangements for the past 2 months:  [place] of Information:  Adult Children Patient Interpreter Needed:  None Criminal Activity/Legal Involvement Pertinent to Current Situation/Hospitalization:  No - Comment as needed Significant Relationships:  Adult Children Lives with:  Self Do you feel safe going back to the place where you live?  No Need for family participation in patient care:  Yes (Comment)  Care giving concerns:  CSW received consult for possible SNF placement at time of discharge. CSW met with patient's daughter regarding PT recommendation of SNF placement at time of discharge. Per patient's daughter, patient's daughter is currently unable to care for patient at their home given patient's current physical needs and fall risk. Patient's daughter expressed understanding of PT recommendation and is agreeable to SNF placement at time of discharge. CSW to continue to follow and assist with discharge planning needs.   Social Worker assessment / plan:  CSW spoke with patient's daughter concerning possibility of rehab at [facility] before returning home.  Employment status:  Retired Nurse, adult PT Recommendations:  [place] / Referral to community resources:  [place]  Patient/Family's Response to care:  Patient's daughter recognizes need for rehab before returning home and are agreeable to a SNF in [place]. Patient  did not like [facility].  Patient/Family's Understanding of and Emotional Response to Diagnosis, Current Treatment, and Prognosis:  Patient/family is realistic regarding therapy needs and expressed being hopeful for SNF placement. Patient's daughter expressed understanding of CSW role and discharge process. No questions/concerns about plan or treatment.    Emotional Assessment Appearance:  Appears stated age Attitude/Demeanor/Rapport:  Unable to Assess Affect (typically observed):  Unable to Assess Orientation:  Oriented to Self, Oriented to Place Alcohol / Substance use:  Not Applicable Psych involvement (Current and /or in the community):  No (Comment)  Discharge Needs  Concerns to be addressed:  Care Coordination Readmission within the last 30 days:  No Current discharge risk:  None Barriers to Discharge:  Continued Medical Work up   [employer], [place] 10/03/2016, 5:15 PM

## 2016-10-03 NOTE — Progress Notes (Addendum)
PROGRESS NOTE                                                                                                                                                                                                             Patient Demographics:    Dustin [name], is a 82 y.o. male, DOB - [birth year], [id number]  Admit date - 10/01/2016   Admitting Physician Lily Kocher, MD  Outpatient Primary MD for the patient is Osborne Casco, MD  LOS - 0  Outpatient Specialists:none  Chief Complaint  Patient presents with  . Code Stroke       Brief Narrative   82 year old male with multiple prior strokes with residual right-sided weakness, hypertension, hyperlipidemia, diabetes mellitus dementia history of prostate cancer, chronic A. fib on Pradaxa brought to the ED by EMS for transient aphasia while at dinner followed by a witnessed convulsion. Patient admitted to hospitalist service and neurology consulted.   Subjective:      Assessment  & Plan :    Principal Problem: Acute metabolic encephalopathy with? New onset seizures Head CT unremarkable for acute findings. MRI brain showing chronic small vessel disease with diffuse cortical atrophy and findings suggestive of long-standing hypertensive encephalopathy., no focal epileptogenic lesion. EEG with generalized slowing without acute seizure activity. No signs of infection. -Neurology consult appreciated. Was started on Trileptal for seizure prophylaxis, switched to Vimpat given sinus bradycardia. PT recommends SNF. Family more inclined on going home with home health.  Active Problems: Sinus bradycardia Heart rate dropping down to high 30s and 40s with 2.2 second pause on telemetry. Twelve-lead EKG showing A. fib with slow rate in the 40s. Patient started on Trileptal yesterday and could be contributing to his sinus bradycardia. He also does dose off during  conversation. D/w neurology and since Trileptal can cause sinus bradycardia in upper 10% of the population recommended to switch to Vimpat. (Started with loading dose IV followed by under milligrams twice daily). Monitor on telemetry. Lites stable. -Check TSH and a.m. cortisol.  History of multiple CVA Has residual weakness and vascular dementia. Continue statin  Chronic A. fib Continue Pradaxa.  Essential hypertension Blood pressure. Continue to hold HCTZ-valsartan.  Hypokalemia Replenished  . Type 2 diabetes mellitus Metformin on hold. Continue sinus difficulties.  History prostate cancer Continue Casodex  Moderate to severe dementia    Code  Status : DO NOT RESUSCITATE  Family Communication  : Wife and daughter at bedside  Disposition Plan  : Home in a.m. if heart rate stable.  Barriers For Discharge : Bradycardia  Consults  :  Neurology  Procedures  :  MRI brain EEG  DVT Prophylaxis  :  Pradaxa  Lab Results  Component Value Date   PLT 136 (L) 10/03/2016    Antibiotics  :    Anti-infectives    None        Objective:   Vitals:   10/02/16 1218 10/02/16 2201 10/03/16 0523 10/03/16 1054  BP: 108/67 (!) 146/78 130/84 (!) 90/54  Pulse: 61 (!) 55 (!) 58 (!) 53  Resp: 18  18 16   Temp: 97.4 F (36.3 C) 97.8 F (36.6 C) 97.7 F (36.5 C) 97.6 F (36.4 C)  TempSrc: Oral Oral Oral Oral  SpO2: 100%  100% 100%  Weight:        Wt Readings from Last 3 Encounters:  10/01/16 82.1 kg (181 lb)  03/09/16 81.6 kg (179 lb 14.3 oz)  03/08/16 84.4 kg (186 lb)     Intake/Output Summary (Last 24 hours) at 10/03/16 1540 Last data filed at 10/03/16 0554  Gross per 24 hour  Intake          1915.84 ml  Output              300 ml  Net          1615.84 ml     Physical Exam  Gen: Not in distress, falls asleep during conversation HEENT:  moist mucosa, supple neck Chest: clear b/l, no added sounds CVS: S1 and S2 bradycardic, no murmurs rubs or gallop GI:  soft, NT, ND Musculoskeletal: warm, no edema CNS: AAOX2, nonfocal    Data Review:    CBC  Recent Labs Lab 10/01/16 2052 10/01/16 2058 10/03/16 0657  WBC 8.9  --  6.1  HGB 13.8 13.6 11.6*  HCT 41.2 40.0 34.4*  PLT 164  --  136*  MCV 97.2  --  94.8  MCH 32.5  --  32.0  MCHC 33.5  --  33.7  RDW 13.0  --  13.1  LYMPHSABS 5.1*  --   --   MONOABS 0.5  --   --   EOSABS 0.1  --   --   BASOSABS 0.0  --   --     Chemistries   Recent Labs Lab 10/01/16 2052 10/01/16 2058 10/02/16 0723 10/03/16 0657  NA 138 139 141 139  K 3.6 3.5 3.3* 3.7  CL 102 103 105 104  CO2 16*  --  27 27  GLUCOSE 199* 192* 144* 118*  BUN 17 18 14 17   CREATININE 1.42* 1.20 1.17 1.17  CALCIUM 9.5  --  8.9 8.9  AST 34  --  31  --   ALT 19  --  18  --   ALKPHOS 41  --  34*  --   BILITOT 2.4*  --  2.7*  --    ------------------------------------------------------------------------------------------------------------------ No results for input(s): CHOL, HDL, LDLCALC, TRIG, CHOLHDL, LDLDIRECT in the last 72 hours.  Lab Results  Component Value Date   HGBA1C 6.6 (H) 03/09/2016   ------------------------------------------------------------------------------------------------------------------ No results for input(s): TSH, T4TOTAL, T3FREE, THYROIDAB in the last 72 hours.  Invalid input(s): FREET3 ------------------------------------------------------------------------------------------------------------------ No results for input(s): VITAMINB12, FOLATE, FERRITIN, TIBC, IRON, RETICCTPCT in the last 72 hours.  Coagulation profile  Recent Labs Lab 10/01/16 2052 10/02/16 [id number]  INR 2.44 1.77    No results for input(s): DDIMER in the last 72 hours.  Cardiac Enzymes No results for input(s): CKMB, TROPONINI, MYOGLOBIN in the last 168 hours.  Invalid input(s): CK ------------------------------------------------------------------------------------------------------------------ No results found  for: BNP  Inpatient Medications  Scheduled Meds: . bicalutamide  50 mg Oral Daily  . brimonidine  1 drop Both Eyes BID  . dabigatran  150 mg Oral BID  . insulin aspart  0-15 Units Subcutaneous TID WC  . mouth rinse  15 mL Mouth Rinse BID  . OXcarbazepine  300 mg Oral BID  . pantoprazole  40 mg Oral Daily  . pravastatin  40 mg Oral q1800  . sertraline  25 mg Oral Daily   Continuous Infusions: . sodium chloride 50 mL/hr at 10/03/16 0225   PRN Meds:.acetaminophen **OR** acetaminophen (TYLENOL) oral liquid 160 mg/5 mL **OR** acetaminophen  Micro Results No results found for this or any previous visit (from the past 240 hour(s)).  Radiology Reports Mr Jeri Cos Wo Contrast  Result Date: 10/02/2016 CLINICAL DATA:  Transient aphasia, witnessed earlier today. History of chronic atrial fibrillation, convulsive like activity. EXAM: MRI HEAD WITHOUT AND WITH CONTRAST TECHNIQUE: Multiplanar, multiecho pulse sequences of the brain and surrounding structures were obtained without and with intravenous contrast. CONTRAST:  42mL MULTIHANCE GADOBENATE DIMEGLUMINE 529 MG/ML IV SOLN COMPARISON:  CT head 10/01/2016.  MR head 03/08/2016. FINDINGS: The patient was unable to remain motionless for the exam. Small or subtle lesions could be overlooked. Brain: No restricted diffusion to indicate acute infarction. Widespread areas of chronic infarction associated with atrophic change. Hydrocephalus ex vacuo. Chronic lacunar infarcts affect both the deep nuclei as well as the white matter. Focal and confluent T2 and FLAIR signal abnormality throughout the white matter consistent with chronic microvascular ischemic change. Widespread micro bleeds, seen on gradient sequence, suggests hypertensive cerebrovascular disease as etiology. Coronal imaging through the temporal lobes demonstrates no signs of mass, infection, or inflammation. Post infusion, no abnormal enhancement of the brain or meninges. Vascular: Normal flow  voids. Skull and upper cervical spine: Normal marrow signal. Sinuses/Orbits: Negative. Other: None. Compared with most recent prior, small deep white matter infarct noted that time has resolved. IMPRESSION: Atrophy and small vessel disease. 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 No acute intracranial findings. No underlying cause for epileptiform activity is observed on this motion degraded exam. Electronically Signed   By: Staci Righter M.D.   On: 10/02/2016 19:27   Ct Head Code Stroke W/o Cm  Result Date: 10/01/2016 CLINICAL DATA:  Code stroke. 82 year old male with slurred speech and witnessed seizure. Atrial fibrillation. Dementia. Prostate cancer. Initial encounter. EXAM: CT HEAD WITHOUT CONTRAST TECHNIQUE: Contiguous axial images were obtained from the base of the skull through the vertex without intravenous contrast. COMPARISON:  03/08/2016 head CT and brain MR. FINDINGS: Brain: No intracranial hemorrhage or CT evidence of large acute infarct. Evaluation of acute infarct limited by motion. Remote left basal ganglia/ corona radiata infarct. Chronic microvascular changes. Global atrophy. Ventricular prominence unchanged and probably atrophy rather hydrocephalus. Vascular: Vascular calcifications. Middle cerebral artery branch vessels slightly dense but without change since prior exam. Skull: No acute abnormality. Sinuses/Orbits: Post left lens replacement. Minimal exophthalmos. Visualized sinuses clear. Other: Negative. ASPECTS [facility] Stroke Program Early CT Score) - Ganglionic level infarction (caudate, lentiform nuclei, internal capsule, insula, M1-M3 cortex): 7 - Supraganglionic infarction (M4-M6 cortex): 3 Total score (0-10 with 10 being normal): 10 IMPRESSION: 1. No intracranial hemorrhage or CT evidence of large acute infarct. Evaluation  limited by motion. Remote left basal ganglia/corona radiata infarct. Chronic microvascular changes. Global atrophy. 2. ASPECTS is 10 These results  were called by telephone at the time of interpretation on 10/01/2016 at 9:15 pm to Dr. Elnora Morrison , who verbally acknowledged these results. Electronically Signed   By: Genia Del M.D.   On: 10/01/2016 21:16    Time Spent in minutes  25   Louellen Molder M.D on 10/03/2016 at 3:40 PM  Between 7am to 7pm - Pager - [phone]  After 7pm go to [email or web address] - password [facility]  [facility] -  Office  [phone]

## 2016-10-03 NOTE — Progress Notes (Signed)
Pt has not been on restraint the whole of the shift, he only has a mint on, will continue to monitor

## 2016-10-03 NOTE — Progress Notes (Addendum)
Pt's heart rate is brady in the 40's and pt had a 2.55 second pause. MD made aware. No new orders. Will continue to monitor.

## 2016-10-03 NOTE — Evaluation (Signed)
Occupational Therapy Evaluation Patient Details Name: Dustin [name] MRN: [id number] DOB: 05/15/1927 Today's Date: 10/03/2016    History of Present Illness 82 y.o.gentleman with a history of multiple prior strokes, residual right sided weakness at baseline, HTN, HLD, DM, dementia, prostate cancer, and chronic atrial fibrillation who presents to the ED via EMS for evaluation of transient aphasia, witnessed by his wife during dinner, followed by reported convulsions concerning for new onset seizure activity.    Clinical Impression   Pt is a poor historian and no family present to provide accurate PLOF information. Pt required min assist +2 for basic transfers, min guard for seated UB ADL, and max assist for LB ADL. Anticipate pt is close to baseline however no family present to confirm. Recommending HHOT for follow up with 24/7 supervision to maximize independence and safety with ADL and functional mobility. Pt would benefit from continued skilled OT to address established goals.    Follow Up Recommendations  Home health OT;Supervision/Assistance - 24 hour    Equipment Recommendations  None recommended by OT    Recommendations for Other Services       Precautions / Restrictions Precautions Precautions: Fall Restrictions Weight Bearing Restrictions: No      Mobility Bed Mobility Overal bed mobility: Needs Assistance Bed Mobility: Supine to Sit     Supine to sit: Min assist;HOB elevated     General bed mobility comments: Min assist for trunk elevation from supine to sit. HOB elevated without use of bed rail. Pt able to manage LEs to EOB and scoot hips out to EOB.  Transfers Overall transfer level: Needs assistance Equipment used: Rolling walker (2 wheeled) Transfers: Sit to/from [facility] Sit to Stand: Min assist;+2 physical assistance;+2 safety/equipment Stand pivot transfers: Min assist;+2 physical assistance;+2 safety/equipment       General  transfer comment: Cues for sequencing and safet with RW. Pt placing both hands on RW and pulling up.    Balance Overall balance assessment: Needs assistance Sitting-balance support: Feet supported;Bilateral upper extremity supported Sitting balance-Leahy Scale: Fair     Standing balance support: Bilateral upper extremity supported Standing balance-Leahy Scale: Poor                              ADL Overall ADL's : Needs assistance/impaired     Grooming: Min guard;Sitting   Upper Body Bathing: Min guard;Sitting   Lower Body Bathing: Maximal assistance;Sit to/from stand   Upper Body Dressing : Min guard;Sitting   Lower Body Dressing: Maximal assistance;Sit to/from stand   Toilet Transfer: Minimal assistance;+2 for physical assistance;+2 for safety/equipment;Stand-pivot;BSC;RW Toilet Transfer Details (indicate cue type and reason): Simulated by stand pivot from EOB to chair         Functional mobility during ADLs: Minimal assistance;+2 for physical assistance;+2 for safety/equipment;Rolling walker (for stand pivot only)       Vision     Perception     Praxis      Pertinent Vitals/Pain Pain Assessment: Faces Faces Pain Scale: No hurt     Hand Dominance Right   Extremity/Trunk Assessment Upper Extremity Assessment Upper Extremity Assessment: Generalized weakness   Lower Extremity Assessment Lower Extremity Assessment: Defer to PT evaluation   Cervical / Trunk Assessment Cervical / Trunk Assessment: Kyphotic   Communication Communication Communication: Expressive difficulties (occasional word finding issues, "I cant get my words out")   Cognition Arousal/Alertness: Awake/alert Behavior During Therapy: Flat affect Overall Cognitive Status: No family/caregiver present to  determine baseline cognitive functioning                 General Comments: Anticipate pt is at or close to baseline cognitively but no family present to confirm.    General Comments       Exercises       Shoulder Instructions      Home Living Family/patient expects to be discharged to:: Private residence Living Arrangements: Children;Spouse/significant other Available Help at Discharge: Family;Available 24 hours/day Type of Home: House                           Additional Comments: Pt is a poor historian; unsure of home set up information. Home set up information taken from last hospital admission. No family present.      Prior Functioning/Environment          Comments: Per pt; uses a RW for mobility and independent with ADL. Unsure of accuracy of PLOF information.        OT Problem List: Decreased strength;Impaired balance (sitting and/or standing);Decreased cognition;Decreased safety awareness;Decreased knowledge of use of DME or AE;Decreased knowledge of precautions   OT Treatment/Interventions: Self-care/ADL training;Therapeutic exercise;Energy conservation;DME and/or AE instruction;Therapeutic activities;Patient/family education;Balance training    OT Goals(Current goals can be found in the care plan section) Acute Rehab OT Goals Patient Stated Goal: none stated OT Goal Formulation: With patient Time For Goal Achievement: 10/17/16 Potential to Achieve Goals: Good ADL Goals Pt Will Perform Grooming: sitting;with set-up Pt Will Perform Upper Body Bathing: with set-up;with supervision;sitting Pt Will Perform Lower Body Bathing: with min guard assist;sit to/from stand Pt Will Transfer to Toilet: with min assist;ambulating;bedside commode Pt Will Perform Toileting - Clothing Manipulation and hygiene: sit to/from stand;with min guard assist  OT Frequency: Min 2X/week   Barriers to D/C:            Co-evaluation PT/OT/SLP Co-Evaluation/Treatment: Yes Reason for Co-Treatment: For patient/therapist safety;Necessary to address cognition/behavior during functional activity   OT goals addressed during session: ADL's and  self-care;Other (comment) (functional mobility)      End of Session Equipment Utilized During Treatment: Rolling walker;Oxygen Nurse Communication: Mobility status  Activity Tolerance: Patient tolerated treatment well Patient left: in chair;with call bell/phone within reach;with nursing/sitter in room   Time: 0947-1001 OT Time Calculation (min): 14 min Charges:  OT General Charges $OT Visit: 1 Procedure OT Evaluation $OT Eval Moderate Complexity: 1 Procedure G-Codes: OT G-codes **NOT FOR INPATIENT CLASS** Functional Assessment Tool Used: Clinical judgement Functional Limitation: Self care Self Care Current Status [id number]): At least 20 percent but less than 40 percent impaired, limited or restricted Self Care Goal Status [id number]): At least 1 percent but less than 20 percent impaired, limited or restricted   Binnie Kand M.S., OTR/L Pager: [phone]  10/03/2016, 10:38 AM

## 2016-10-04 LAB — GLUCOSE, CAPILLARY
GLUCOSE-CAPILLARY: 117 mg/dL — AB (ref 65–99)
GLUCOSE-CAPILLARY: 155 mg/dL — AB (ref 65–99)
GLUCOSE-CAPILLARY: 107 mg/dL — AB (ref 65–99)
GLUCOSE-CAPILLARY: 160 mg/dL — AB (ref 65–99)

## 2016-10-04 LAB — TSH: TSH: 1.532 u[IU]/mL (ref 0.350–4.500)

## 2016-10-04 LAB — CORTISOL: Cortisol, Plasma: 9.9 ug/dL

## 2016-10-04 NOTE — Progress Notes (Signed)
Speech Language Pathology Treatment: Cognitive-Linquistic  Patient Details Name: Dustin [name] MRN: [id number] DOB: Nov 11, 1926 Today's Date: 10/04/2016 Time: 1110-1120 SLP Time Calculation (min) (ACUTE ONLY): 10 min  Assessment / Plan / Recommendation Clinical Impression  Patient presents with heightened level of confusion; is oriented to self only. Provided reorientation using external aids, as well as educated family members regarding potential impacts of hospitalization on confusion. Recommended use of visual aids and strategies to improve orientation. Daughter verbalizes understanding. No further needs identified at this time. Education completed; SLP will sign off.     HPI HPI: Dustin Rossodivitais a 82 y.o.gentleman with a history of multiple prior strokes, residual right sided weakness at baseline, HTN, HLD, DM, dementia, prostate cancer, and chronic atrial fibrillation who presented to the ED via EMS for evaluation of transient aphasia, witnessed by his wife during dinner, followed by reported convulsions concerning for new onset seizure activity. MRI 10/02/16 findings of atrophy and small vessel disease. 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 No acute intracranial findings.      SLP Plan  All goals met;Discharge SLP treatment due to (comment) (education complete)     Recommendations  Medication Administration: Whole meds with liquid Compensations: Slow rate;Small sips/bites                Oral Care Recommendations: Oral care BID Follow up Recommendations: Skilled Nursing facility Plan: All goals met;Discharge SLP treatment due to (comment) (education complete)       [place], MS CF-SLP Speech-Language Pathologist   Aliene Altes 10/04/2016, 11:37 AM

## 2016-10-04 NOTE — Evaluation (Signed)
Clinical/Bedside Swallow Evaluation Patient Details  Name: Dustin [name] MRN: [id number] Date of Birth: Aug 02, 1927  Today's Date: 10/04/2016 Time: SLP Start Time (ACUTE ONLY): [id number] SLP Stop Time (ACUTE ONLY): 1109 SLP Time Calculation (min) (ACUTE ONLY): 16 min  Past Medical History:  Past Medical History:  Diagnosis Date  . A-fib ([place])   . Arthritis   . Bilateral knee pain   . Cancer ([place]) 2012   skin cancers  . Dementia   . Diabetes mellitus without complication ([place])   . Hyperlipidemia   . Hypertension   . Pancreatitis   . Poor balance   . Prostate cancer ([place])   . Stroke [facility])    Past Surgical History:  Past Surgical History:  Procedure Laterality Date  . APPENDECTOMY    . CHOLECYSTECTOMY    . EYE SURGERY    . PANCREAS SURGERY     HPI:  Dustin [name] a 82 y.o.gentleman with a history of multiple prior strokes, residual right sided weakness at baseline, HTN, HLD, DM, dementia, prostate cancer, and chronic atrial fibrillation who presented to the ED via EMS for evaluation of transient aphasia, witnessed by his wife during dinner, followed by reported convulsions concerning for new onset seizure activity. MRI 10/02/16 findings of atrophy and small vessel disease. 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 No acute intracranial findings. Per MD, complains of coughing with meals. Seen for bedside swallowing evaluation.   Assessment / Plan / Recommendation Clinical Impression  Patient presents with oropharyngeal swallow which appears within functional limits at bedside. No overt signs or symptoms of aspiration observed. Patient is noted to have increased level of confusion compared with yesterday's cognitive-linguistic assessment. He presents with some sensory deficits noted during bolus retrieval and requires moderate assistance for self-feeding. Recommend continuing regular diet with thin liquids, full supervision with assistance as needed for feeding.  Provided education to family regarding signs and symptoms of aspiration, as well as general aspiration precautions, upright posture and slow rate of intake for feeding. No further dysphagia needs identified. SLP will sign off.    Aspiration Risk  Mild aspiration risk;Other (comment) (due to cognitive status)    Diet Recommendation Regular;Thin liquid   Liquid Administration via: Cup Medication Administration: Whole meds with liquid Supervision: Staff to assist with self feeding Compensations: Slow rate;Small sips/bites Postural Changes: Seated upright at 90 degrees    Other  Recommendations Oral Care Recommendations: Oral care BID   Follow up Recommendations Skilled Nursing facility      Frequency and Duration            Prognosis Prognosis for Safe Diet Advancement: Good Barriers to Reach Goals: Cognitive deficits      Swallow Study   General HPI: Dustin [name] a 82 y.o.gentleman with a history of multiple prior strokes, residual right sided weakness at baseline, HTN, HLD, DM, dementia, prostate cancer, and chronic atrial fibrillation who presented to the ED via EMS for evaluation of transient aphasia, witnessed by his wife during dinner, followed by reported convulsions concerning for new onset seizure activity. MRI 10/02/16 findings of atrophy and small vessel disease. 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 No acute intracranial findings. Per MD, complains of coughing with meals. Seen for bedside swallowing evaluation. Type of Study: Bedside Swallow Evaluation Previous Swallow Assessment: none per chart Diet Prior to this Study: Regular;Thin liquids Temperature Spikes Noted: No Respiratory Status: Nasal cannula History of Recent Intubation: No Behavior/Cognition: Alert;Confused Oral Cavity Assessment: Within Functional Limits Oral Care Completed by SLP: No Oral Cavity - Dentition: Missing dentition  Vision: Functional for self-feeding Self-Feeding  Abilities: Needs assist Patient Positioning: Upright in bed Baseline Vocal Quality: Normal Volitional Cough: Strong Volitional Swallow: Able to elicit    Oral/Motor/Sensory Function Overall Oral Motor/Sensory Function: Within functional limits   Ice Chips Ice chips: Within functional limits Presentation: Spoon   Thin Liquid Thin Liquid: Within functional limits Presentation: Cup;Self Fed;Straw    Nectar Thick Nectar Thick Liquid: Not tested   Honey Thick Honey Thick Liquid: Not tested   Puree Puree: Within functional limits Presentation: Self Fed;Spoon   Solid   GO  Dustin [name], [place] CF-SLP Speech-Language Pathologist [phone] Solid: Within functional limits Presentation: Self Fed        Dustin [name] 10/04/2016,11:29 AM

## 2016-10-04 NOTE — Progress Notes (Signed)
PROGRESS NOTE                                                                                                                                                                                                             Patient Demographics:    Dustin [name], is a 82 y.o. male, DOB - 12/24/1926, [id number]  Admit date - 10/01/2016   Admitting Physician Lily Kocher, MD  Outpatient Primary MD for the patient is Osborne Casco, MD  LOS - 1  Outpatient Specialists:none  Chief Complaint  Patient presents with  . Code Stroke       Brief Narrative   82 year old male with multiple prior strokes with residual right-sided weakness, hypertension, hyperlipidemia, diabetes mellitus dementia history of prostate cancer, chronic A. fib on Pradaxa brought to the ED by EMS for transient aphasia while at dinner followed by a witnessed convulsion. Patient admitted to hospitalist service and neurology consulted.   Subjective:   HR better overnight. confused this am.   Assessment  & Plan :    Principal Problem: Acute metabolic encephalopathy with? New onset seizures Head CT unremarkable for acute findings. MRI brain showing chronic small vessel disease with diffuse cortical atrophy and findings suggestive of long-standing hypertensive encephalopathy., no focal epileptogenic lesion. EEG with generalized slowing without acute seizure activity. No signs of infection. -still has confusion although is more awake. Likely worsening of his underlying dementia with acute illness. -Neurology consult appreciated. Was started on Trileptal for seizure prophylaxis, switched to Vimpat given sinus bradycardia. PT recommends SNF.  Active Problems: Sinus bradycardia Heart rate dropping down to high 30s and 40s with 2.2 second pause on telemetry.  Was started On Trileptal for seizure prophylaxis, switched to Vimpat after discussing with  neurology. - TSH and a.m. cortisol are normal.  History of multiple CVA Has residual weakness and vascular dementia. Continue statin  Chronic A. fib Continue Pradaxa.  Essential hypertension Blood pressure. Continue to hold HCTZ-valsartan.  Hypokalemia Replenished  .Type 2 diabetes mellitus Metformin on hold. Continue  Sliding scale  History prostate cancer Continue Casodex  Moderate to severe dementia    Code Status : DO NOT RESUSCITATE  Family Communication  : Wife and daughter at bedside  Disposition Plan  : SNF in am if HR rmains stable and encephalopathy better  Barriers For Discharge : Bradycardia/ encephalopathy  Consults  :  Neurology  Procedures  :  MRI brain EEG  DVT Prophylaxis  :  Pradaxa  Lab Results  Component Value Date   PLT 136 (L) 10/03/2016    Antibiotics  :    Anti-infectives    None        Objective:   Vitals:   10/03/16 2149 10/03/16 2338 10/04/16 0214 10/04/16 0457  BP: 101/69 119/73 129/68 (!) 157/70  Pulse: 70 (!) 54 (!) 55 60  Resp: 18 17 18 18   Temp: 97.6 F (36.4 C) 98 F (36.7 C) 97.9 F (36.6 C) 98 F (36.7 C)  TempSrc: Oral Oral Oral   SpO2: 100% 100% 100% 100%  Weight:        Wt Readings from Last 3 Encounters:  10/01/16 82.1 kg (181 lb)  03/09/16 81.6 kg (179 lb 14.3 oz)  03/08/16 84.4 kg (186 lb)     Intake/Output Summary (Last 24 hours) at 10/04/16 1258 Last data filed at 10/04/16 0505  Gross per 24 hour  Intake              595 ml  Output              925 ml  Net             -330 ml     Physical Exam  Gen: Not in distress,  HEENT:  moist mucosa, supple neck Chest: clear b/l, no added sounds CVS: S1 and S2 bradycardic, no murmurs  GI: soft, NT, ND Musculoskeletal: warm, no edema CNS: AAOX1, nonfocal    Data Review:    CBC  Recent Labs Lab 10/01/16 2052 10/01/16 2058 10/03/16 0657  WBC 8.9  --  6.1  HGB 13.8 13.6 11.6*  HCT 41.2 40.0 34.4*  PLT 164  --  136*  MCV 97.2  --   94.8  MCH 32.5  --  32.0  MCHC 33.5  --  33.7  RDW 13.0  --  13.1  LYMPHSABS 5.1*  --   --   MONOABS 0.5  --   --   EOSABS 0.1  --   --   BASOSABS 0.0  --   --     Chemistries   Recent Labs Lab 10/01/16 2052 10/01/16 2058 10/02/16 0723 10/03/16 0657  NA 138 139 141 139  K 3.6 3.5 3.3* 3.7  CL 102 103 105 104  CO2 16*  --  27 27  GLUCOSE 199* 192* 144* 118*  BUN 17 18 14 17   CREATININE 1.42* 1.20 1.17 1.17  CALCIUM 9.5  --  8.9 8.9  AST 34  --  31  --   ALT 19  --  18  --   ALKPHOS 41  --  34*  --   BILITOT 2.4*  --  2.7*  --    ------------------------------------------------------------------------------------------------------------------ No results for input(s): CHOL, HDL, LDLCALC, TRIG, CHOLHDL, LDLDIRECT in the last 72 hours.  Lab Results  Component Value Date   HGBA1C 6.6 (H) 03/09/2016   ------------------------------------------------------------------------------------------------------------------  Recent Labs  10/04/16 0507  TSH 1.532   ------------------------------------------------------------------------------------------------------------------ No results for input(s): VITAMINB12, FOLATE, FERRITIN, TIBC, IRON, RETICCTPCT in the last 72 hours.  Coagulation profile  Recent Labs Lab 10/01/16 2052 10/02/16 0723  INR 2.44 1.77    No results for input(s): DDIMER in the last 72 hours.  Cardiac Enzymes No results for input(s): CKMB, TROPONINI, MYOGLOBIN in the last 168 hours.  Invalid input(s): CK ------------------------------------------------------------------------------------------------------------------ No results found for:  BNP  Inpatient Medications  Scheduled Meds: . bicalutamide  50 mg Oral Daily  . brimonidine  1 drop Both Eyes BID  . dabigatran  150 mg Oral BID  . insulin aspart  0-15 Units Subcutaneous TID WC  . lacosamide  100 mg Oral BID  . mouth rinse  15 mL Mouth Rinse BID  . pantoprazole  40 mg Oral Daily  .  pravastatin  40 mg Oral q1800  . sertraline  25 mg Oral Daily   Continuous Infusions: . sodium chloride 50 mL/hr at 10/03/16 0225   PRN Meds:.acetaminophen **OR** acetaminophen (TYLENOL) oral liquid 160 mg/5 mL **OR** acetaminophen  Micro Results No results found for this or any previous visit (from the past 240 hour(s)).  Radiology Reports Mr Jeri Cos Wo Contrast  Result Date: 10/02/2016 CLINICAL DATA:  Transient aphasia, witnessed earlier today. History of chronic atrial fibrillation, convulsive like activity. EXAM: MRI HEAD WITHOUT AND WITH CONTRAST TECHNIQUE: Multiplanar, multiecho pulse sequences of the brain and surrounding structures were obtained without and with intravenous contrast. CONTRAST:  7mL MULTIHANCE GADOBENATE DIMEGLUMINE 529 MG/ML IV SOLN COMPARISON:  CT head 10/01/2016.  MR head 03/08/2016. FINDINGS: The patient was unable to remain motionless for the exam. Small or subtle lesions could be overlooked. Brain: No restricted diffusion to indicate acute infarction. Widespread areas of chronic infarction associated with atrophic change. Hydrocephalus ex vacuo. Chronic lacunar infarcts affect both the deep nuclei as well as the white matter. Focal and confluent T2 and FLAIR signal abnormality throughout the white matter consistent with chronic microvascular ischemic change. Widespread micro bleeds, seen on gradient sequence, suggests hypertensive cerebrovascular disease as etiology. Coronal imaging through the temporal lobes demonstrates no signs of mass, infection, or inflammation. Post infusion, no abnormal enhancement of the brain or meninges. Vascular: Normal flow voids. Skull and upper cervical spine: Normal marrow signal. Sinuses/Orbits: Negative. Other: None. Compared with most recent prior, small deep white matter infarct noted that time has resolved. IMPRESSION: Atrophy and small vessel disease. 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 No acute intracranial findings.  No underlying cause for epileptiform activity is observed on this motion degraded exam. Electronically Signed   By: Staci Righter M.D.   On: 10/02/2016 19:27   Ct Head Code Stroke W/o Cm  Result Date: 10/01/2016 CLINICAL DATA:  Code stroke. 82 year old male with slurred speech and witnessed seizure. Atrial fibrillation. Dementia. Prostate cancer. Initial encounter. EXAM: CT HEAD WITHOUT CONTRAST TECHNIQUE: Contiguous axial images were obtained from the base of the skull through the vertex without intravenous contrast. COMPARISON:  03/08/2016 head CT and brain MR. FINDINGS: Brain: No intracranial hemorrhage or CT evidence of large acute infarct. Evaluation of acute infarct limited by motion. Remote left basal ganglia/ corona radiata infarct. Chronic microvascular changes. Global atrophy. Ventricular prominence unchanged and probably atrophy rather hydrocephalus. Vascular: Vascular calcifications. Middle cerebral artery branch vessels slightly dense but without change since prior exam. Skull: No acute abnormality. Sinuses/Orbits: Post left lens replacement. Minimal exophthalmos. Visualized sinuses clear. Other: Negative. ASPECTS [facility] Stroke Program Early CT Score) - Ganglionic level infarction (caudate, lentiform nuclei, internal capsule, insula, M1-M3 cortex): 7 - Supraganglionic infarction (M4-M6 cortex): 3 Total score (0-10 with 10 being normal): 10 IMPRESSION: 1. No intracranial hemorrhage or CT evidence of large acute infarct. Evaluation limited by motion. Remote left basal ganglia/corona radiata infarct. Chronic microvascular changes. Global atrophy. 2. ASPECTS is 10 These results were called by telephone at the time of interpretation on 10/01/2016 at 9:15 pm to Dr. Elnora Morrison , who  verbally acknowledged these results. Electronically Signed   By: Genia Del M.D.   On: 10/01/2016 21:16    Time Spent in minutes  25   Louellen Molder M.D on 10/04/2016 at 12:58 PM  Between 7am to 7pm - Pager  - [phone]  After 7pm go to [email or web address] - password [facility]  [facility] -  Office  [phone]

## 2016-10-05 ENCOUNTER — Encounter (HOSPITAL_COMMUNITY): Payer: Self-pay | Admitting: Nurse Practitioner

## 2016-10-05 DIAGNOSIS — R001 Bradycardia, unspecified: Secondary | ICD-10-CM | POA: Diagnosis not present

## 2016-10-05 DIAGNOSIS — L8990 Pressure ulcer of unspecified site, unspecified stage: Secondary | ICD-10-CM | POA: Diagnosis present

## 2016-10-05 DIAGNOSIS — I674 Hypertensive encephalopathy: Secondary | ICD-10-CM | POA: Diagnosis present

## 2016-10-05 DIAGNOSIS — F0391 Unspecified dementia with behavioral disturbance: Secondary | ICD-10-CM

## 2016-10-05 DIAGNOSIS — R000 Tachycardia, unspecified: Secondary | ICD-10-CM

## 2016-10-05 DIAGNOSIS — E876 Hypokalemia: Secondary | ICD-10-CM

## 2016-10-05 DIAGNOSIS — Z8673 Personal history of transient ischemic attack (TIA), and cerebral infarction without residual deficits: Secondary | ICD-10-CM

## 2016-10-05 DIAGNOSIS — I495 Sick sinus syndrome: Secondary | ICD-10-CM

## 2016-10-05 DIAGNOSIS — I472 Ventricular tachycardia: Secondary | ICD-10-CM

## 2016-10-05 DIAGNOSIS — I482 Chronic atrial fibrillation: Secondary | ICD-10-CM

## 2016-10-05 LAB — GLUCOSE, CAPILLARY
GLUCOSE-CAPILLARY: 123 mg/dL — AB (ref 65–99)
GLUCOSE-CAPILLARY: 116 mg/dL — AB (ref 65–99)
Glucose-Capillary: 121 mg/dL — ABNORMAL HIGH (ref 65–99)
Glucose-Capillary: 138 mg/dL — ABNORMAL HIGH (ref 65–99)

## 2016-10-05 LAB — TROPONIN I
TROPONIN I: 0.16 ng/mL — AB (ref <0.03)
Troponin I: 0.13 ng/mL (ref <0.03)

## 2016-10-05 LAB — BASIC METABOLIC PANEL
Anion gap: 9 (ref 5–15)
BUN: 15 mg/dL (ref 6–20)
CHLORIDE: 106 mmol/L (ref 101–111)
CO2: 26 mmol/L (ref 22–32)
Calcium: 9.1 mg/dL (ref 8.9–10.3)
Creatinine, Ser: 1.25 mg/dL — ABNORMAL HIGH (ref 0.61–1.24)
GFR calc Af Amer: 58 mL/min — ABNORMAL LOW (ref >60)
GFR, EST NON AFRICAN AMERICAN: 50 mL/min — AB (ref >60)
GLUCOSE: 134 mg/dL — AB (ref 65–99)
POTASSIUM: 3.5 mmol/L (ref 3.5–5.1)
Sodium: 141 mmol/L (ref 135–145)

## 2016-10-05 LAB — MAGNESIUM: Magnesium: 1.8 mg/dL (ref 1.7–2.4)

## 2016-10-05 LAB — MRSA PCR SCREENING: MRSA by PCR: NEGATIVE

## 2016-10-05 MED ORDER — LACOSAMIDE 100 MG PO TABS: 100.0000 mg | ORAL_TABLET | Freq: Two times a day (BID) | ORAL | 0 refills | Status: AC

## 2016-10-05 MED ORDER — SODIUM CHLORIDE 0.9 % IV BOLUS (SEPSIS)
500.0000 mL | Freq: Once | INTRAVENOUS | Status: AC
  Administered 2016-10-05: 500 mL via INTRAVENOUS

## 2016-10-05 MED ORDER — POTASSIUM CHLORIDE CRYS ER 20 MEQ PO TBCR: 40.0000 meq | EXTENDED_RELEASE_TABLET | Freq: Once | ORAL | Status: DC

## 2016-10-05 MED ORDER — MAGNESIUM SULFATE 4 GM/100ML IV SOLN
4.0000 g | Freq: Once | INTRAVENOUS | Status: AC
  Administered 2016-10-05: 4 g via INTRAVENOUS
  Filled 2016-10-05: qty 100

## 2016-10-05 MED ORDER — SODIUM CHLORIDE 0.9 % IV BOLUS (SEPSIS)
250.0000 mL | Freq: Once | INTRAVENOUS | Status: AC
  Administered 2016-10-05: 250 mL via INTRAVENOUS

## 2016-10-05 MED ORDER — POTASSIUM CHLORIDE 2 MEQ/ML IV SOLN
30.0000 meq | Freq: Once | INTRAVENOUS | Status: AC
  Administered 2016-10-05: 30 meq via INTRAVENOUS
  Filled 2016-10-05: qty 15

## 2016-10-05 MED ORDER — SODIUM CHLORIDE 0.9 % IV BOLUS (SEPSIS)
1000.0000 mL | Freq: Once | INTRAVENOUS | Status: AC
  Administered 2016-10-05: 1000 mL via INTRAVENOUS

## 2016-10-05 MED ORDER — HYDROCORTISONE NA SUCCINATE PF 100 MG IJ SOLR
100.0000 mg | Freq: Three times a day (TID) | INTRAMUSCULAR | Status: DC
  Administered 2016-10-05 – 2016-10-06 (×4): 100 mg via INTRAVENOUS
  Filled 2016-10-05 (×4): qty 2

## 2016-10-05 NOTE — Progress Notes (Signed)
OT Cancellation Note  Patient Details Name: Dustin [name] MRN: [id number] DOB: September 23, 1927   Cancelled Treatment:    Reason Eval/Treat Not Completed: Medical issues which prohibited therapy. RN and rapid RN present with pt at this time. Will follow up as pt more medically appropriate.   Binnie Kand M.S., OTR/L Pager: [phone]  10/05/2016, 11:29 AM

## 2016-10-05 NOTE — Progress Notes (Addendum)
CSW following for DC to [facility] when stable- planning for DC today but patient had an episode and DC canceled.  Per MD will plan for DC tomorrow if stable- [employer] auth good through 12/31  CSW will continue to follow  Jorge Ny, [place] Social Worker [phone]

## 2016-10-05 NOTE — Consult Note (Signed)
Reason for Consult:   Bradycardia, NSWCT  Requesting Physician: [facility] Primary Cardiologist New  HPI:   82 y/o demented male, SNF pt, with a history of CAF on Pradaxa, HTN, DM, and CVA June 2016. Echo then showed normal LVF, mild LAE. He was admitted 10/01/16 after he had an apparent "seizure" and collapsed at the table. Since he was admitted, telemetry has shown AF with slow VR -similar to previous EKGs. He was being discharged today when he had bradycardia into the high 30's. He also had a run of NSWCT. We are asked to see in consult. The pt is non communicative and unable to give any history. He is DNR and the family does not want a pacemaker per conversation with RN.   PMHx:  Past Medical History:  Diagnosis Date  . Arthritis   . Bilateral knee pain   . Cancer ([place]) 2012   skin cancers  . Chronic atrial fibrillation (HCC)    a. CHA2DS2VASc = 6-->pradaxa;  b. 03/2016 Echo: EF 60-65%, no rwma, mildly dil LA.  [name] Dementia   . Diabetes mellitus without complication ([place])   . Hyperlipidemia   . Hypertensive heart disease   . Pancreatitis   . Poor balance   . Prostate cancer ([place])   . Stroke [facility])    a. 02/2016 Left lentiform nucleus/corona radiata infarcts no MRI.    Past Surgical History:  Procedure Laterality Date  . APPENDECTOMY    . CHOLECYSTECTOMY    . EYE SURGERY    . PANCREAS SURGERY      SOCHx:  reports that he has quit smoking. He has never used smokeless tobacco. He reports that he drinks about 1.2 oz of alcohol per week . He reports that he does not use drugs.  FAMHx: Family History  Problem Relation Age of Onset  . Stroke Father   . Colon cancer Father   . Diabetes Mellitus II Sister   . Heart attack Neg Hx   . Prostate cancer Neg Hx     ALLERGIES: No Known Allergies  ROS: Review of Systems: unable to obtain- pt demented  HOME MEDICATIONS: Prior to Admission medications   Medication Sig Start Date End Date Taking? Authorizing  Provider  bicalutamide (CASODEX) 50 MG tablet Take 50 mg by mouth daily.   Yes Historical Provider, MD  brimonidine (ALPHAGAN P) 0.1 % SOLN Place 1 drop into both eyes 2 (two) times daily.    Yes Historical Provider, MD  dabigatran (PRADAXA) 150 MG CAPS capsule Take 150 mg by mouth 2 (two) times daily.   Yes Historical Provider, MD  lovastatin (MEVACOR) 40 MG tablet Take 40 mg by mouth daily.    Yes Historical Provider, MD  metFORMIN (GLUCOPHAGE) 500 MG tablet Take 500 mg by mouth daily.   Yes Historical Provider, MD  omeprazole (PRILOSEC) 20 MG capsule Take 20 mg by mouth daily.   Yes Historical Provider, MD  sertraline (ZOLOFT) 25 MG tablet Take 1 tablet by mouth daily. 01/03/16  Yes Historical Provider, MD  valsartan-hydrochlorothiazide (DIOVAN-HCT) 80-12.5 MG per tablet Take 1 tablet by mouth daily.   Yes Historical Provider, MD  lacosamide 100 MG TABS Take 1 tablet (100 mg total) by mouth 2 (two) times daily. 10/05/16   Nishant Dhungel, MD    HOSPITAL MEDICATIONS: I have reviewed the patient's current medications.  VITALS: Blood pressure 116/83, pulse 61, temperature 98.1 F (36.7 C), temperature source Axillary, resp. rate 11, height 5'  10" (1.778 m), weight 181 lb (82.1 kg), SpO2 100 %.  PHYSICAL EXAM: General appearance: no distress, pale and uncooperative Neck: no carotid bruit and no JVD Lungs: dereased breath sounds at bases Heart: irregularly irregular rhythm Abdomen: abd not distended Extremities: no edema Skin: pale, dry Neurologic: uncooperative  LABS: Results for orders placed or performed during the hospital encounter of 10/01/16 (from the past 24 hour(s))  Glucose, capillary     Status: Abnormal   Collection Time: 10/04/16 11:17 PM  Result Value Ref Range   Glucose-Capillary 160 (H) 65 - 99 mg/dL   Comment 1 Notify RN    Comment 2 Document in Chart   Glucose, capillary     Status: Abnormal   Collection Time: 10/05/16  8:07 AM  Result Value Ref Range    Glucose-Capillary 123 (H) 65 - 99 mg/dL  Basic metabolic panel     Status: Abnormal   Collection Time: 10/05/16 11:42 AM  Result Value Ref Range   Sodium 141 135 - 145 mmol/L   Potassium 3.5 3.5 - 5.1 mmol/L   Chloride 106 101 - 111 mmol/L   CO2 26 22 - 32 mmol/L   Glucose, Bld 134 (H) 65 - 99 mg/dL   BUN 15 6 - 20 mg/dL   Creatinine, Ser 1.25 (H) 0.61 - 1.24 mg/dL   Calcium 9.1 8.9 - 10.3 mg/dL   GFR calc non Af Amer 50 (L) >60 mL/min   GFR calc Af Amer 58 (L) >60 mL/min   Anion gap 9 5 - 15  Magnesium     Status: None   Collection Time: 10/05/16 11:42 AM  Result Value Ref Range   Magnesium 1.8 1.7 - 2.4 mg/dL  Glucose, capillary     Status: Abnormal   Collection Time: 10/05/16 12:12 PM  Result Value Ref Range   Glucose-Capillary 116 (H) 65 - 99 mg/dL   Comment 1 Notify RN    Echo: 03/10/16 Study Conclusions  - Left ventricle: The cavity size was normal. There was mild   concentric hypertrophy. Systolic function was normal. The   estimated ejection fraction was in the range of 60% to 65%. Wall   motion was normal; there were no regional wall motion   abnormalities. - Aortic valve: Moderate diffuse thickening and calcification. - Left atrium: The atrium was mildly dilated.  IMAGING: No results found.  IMPRESSION: Principal Problem:   Seizure ([place]) Active Problems:   Dementia   Essential hypertension   Diabetes mellitus type 2, noninsulin dependent (HCC)   Chronic atrial fibrillation (HCC)   History of CVA (cerebrovascular accident)   Encephalopathy, hypertensive   Sinus bradycardia   Pressure injury of skin   RECOMMENDATION: MD to see. He does not appear to be a candidate for a pacemaker.   Time Spent Directly with Patient: [address] minutes  Kerin Ransom, [place] beeper 10/05/2016, 5:12 PM   Patient seen and examined. Agree with assessment and plan.Mr. Clevester Madrigal is a pleasant 82 year old Caucasian male who has a history of eye potential,  diabetes mellitus, remote CVA, and atrial fibrillation for which he has been on Pradaxa.  He has a cha2ds2vasc score of 6.  He was admitted on Christmas Day after an apparent "seizure."  His initial ECG showed atrial fibrillation at 101 bpm with nonspecific ST-T changes.  He had poor R-wave progression.  Subsequent ECG has shown atrial fibrillation with rates in the 50s, but with heart rate slowing down to the low 40s.  I suspect  he has sick sinus syndrome.  He was also found today to have 2 episodes wide-complex tachycardia of 10 and 12 beats with rates in the 160 range.  There was irregularity to his wide-complex rhythm, raising the possibility of supraventricular aberrancy rather than ventricular in etiology.  At present, he is not on any rate control medication and if this were to be initiated.  I suspect he would have more pronounced bradycardia arrhythmia.  His last echo in June 2017 showed normal LV function with aortic valve sclerosis and mild atrial dilatation.  The patient has had a history of multiple prior strokes and has residual right-sided weakness at baseline, dementia, and history of prostate cancer.  Laboratory today is notable for K of 3.5.  At present, will continue to monitor heart rhythm.  Will need more information regarding his living status. The patient tells me he lives with his nephew multiple comorbidities, but I am uncertain as to the accuracy of his statements.  Based on his multiple comorbidities, he may not be an optimal candidate for permanent pacemaker placement.  Will need to readdress his social situation and functional status prior to final recommendations.  Troy Sine, MD, [facility] 10/05/2016 6:55 PM

## 2016-10-05 NOTE — Progress Notes (Signed)
Patient having some SVT on the monitor Patient found to be pale and non responsive. Patient color return and his eyes are opening, however still non verbal. Doctor and Rapid Response call and responsed to the bedside immediately. Bolus started and plans to consult cardiology. Daughter Angela Nevin called and informed of patient status and plans to arrive in 30 mins. IV Started and bolus initiated.

## 2016-10-05 NOTE — Progress Notes (Signed)
Admission note: Late Entry  Arrival Method: Transfer from 5W.  Mental Status: Oriented to person only; very sleepy, barely follows commands.   Skin: Skin tears noted on left forearm and left elbow. Bilateral arms with multiple ecchymotic areas, also left inner thigh with bruise noted. Sacrum noted to have a SDTI, but not measurable - between buttocks appears slightly purple/maroon in color - surrounding skin is very reddened, but blanchable; prophylactic foam dressing applied. Prophylactic foam dressings for heels unavailable at this time so skin protectant wipes were used for both heels. Shayne Alken, RN was second Agricultural engineer. WOC consult placed for SDTI.   Tubes: Condom catheter in place. 2L O2 n/c.  IV: LH NSL and RAFA NSL; both able to be flushed easily.  Pain: Denies.   Family: Daughter at bedside. Living Situation: From home with spouse.  Safety Measures: Bed alarm in place, call bell within reach.    Patient transferred to 3s for low HR and low BP. HR sustaining 30-50's at present time. BP stable. CCMD and Elink notified of patient's arrival to unit.   Joellen Jersey, RN.

## 2016-10-05 NOTE — Progress Notes (Signed)
CRITICAL VALUE ALERT  Critical value received:  Troponin 0.16  Date of notification:  10/05/16  Time of notification:  [id number]  Critical value read back:Yes.    Nurse who received alert:  Dwaine Gale, RN   MD notified (1st page):  Dr. Clementeen Graham  Time of first page:  [phone]  MD notified (2nd page):  Time of second page:  Responding MD:  Dr. Clementeen Graham  Time MD responded:  [phone]

## 2016-10-05 NOTE — Progress Notes (Addendum)
Patient planned for discharge but was noted to be bradycardic into 30s again. Tele showing NSVT and PVCs. BP 80S/60S. Discharge held Ordered stat BMET and mg. IV NS bolus 500cc ordered by 1 L bolus and blood pressure improved. Patient didn't complain of chest pain discomfort. EKG done without acute findings. Troponin ordered. Transfer patient to stepdown unit..  Replenish potassium and magnesium. Consulted cardiology given persistent bradycardia despite holding trileptal and NSVT on monitor.

## 2016-10-05 NOTE — Progress Notes (Signed)
Received call from CCMD that HR went as low as 39.  HR 50s now.  Has been 60-70s throughout most of shift tonight.  Also, BP 94/61 at 2240, down from 130/90 at 2100. MD notified. Patient appears to be more drowsy than earlier but is oriented the same.

## 2016-10-05 NOTE — Discharge Summary (Signed)
Physician Discharge Summary  Dustin [name] [id number] DOB: August 01, 1927 DOA: 10/01/2016  PCP: Osborne Casco, MD  Admit date: 10/01/2016 Discharge date: 10/05/2016  Admitted From: home Disposition:  SNF  Recommendations for Outpatient Follow-up:  1. Follow up with MD at SNF in 1 week 2. ambulatory referral to neurology made for outpt follow up.   Equipment/Devices:per therapy at SNF  Discharge Condition:FAIR CODE STATUS:DNR Diet recommendation: Heart Healthy    Discharge Diagnoses:  Principal Problem:   Seizure ([place])   Active Problems:   Encephalopathy, hypertensive   Dementia   Essential hypertension   Diabetes mellitus type 2, noninsulin dependent (HCC)   Chronic atrial fibrillation (HCC)   History of CVA (cerebrovascular accident)   Acute encephalopathy   Sinus bradycardia  Brief narrative/ HPI Please refer to admission H&P for details, in brief, 82 year old male with multiple prior strokes with residual right-sided weakness, hypertension, hyperlipidemia, diabetes mellitus dementia history of prostate cancer, chronic A. fib on Pradaxa brought to the ED by EMS for transient aphasia while at dinner followed by a witnessed convulsion. Patient admitted to hospitalist service and neurology consulted.  Hospital course  Principal Problem: Acute metabolic encephalopathy with? New onset seizures -Head CT unremarkable for acute findings. MRI brain shows chronic small vessel disease with diffuse cortical atrophy and findings suggestive of long-standing hypertensive encephalopathy., no focal epileptogenic lesion. -EEG with generalized slowing without acute seizure activity. No signs of infection. -Neurology consult appreciated. Was started on Trileptal for seizure prophylaxis, switched to Vimpat given sinus bradycardia. Outpatient referral to neurology made. -PT recommended SNF.  -patient still has confusion but seems slowly improving.    Active  Problems: Sinus bradycardia Heart rate dropping down to high 30s and 40s with 2.2 second pause on telemetry. Twelve-lead EKG showing A. fib with slow rate in the 40s. Patient started on Trileptal for seizures which  could be contributing to his  bradycardia.  TSH and a.m. Cortisol normal. D/w neurology and since Trileptal can cause sinus bradycardia in upper 10% of the population recommended to switch to Vimpat.  -HR has been stable on telemetry past 48 hrs.   History of multiple CVA Has residual weakness and vascular dementia. Continue statin  Chronic A. fib Continue Pradaxa.  Essential hypertension Blood pressure soft on admission. held [place]. Now improved. Resume meds on discharge.  Hypokalemia Replenished  . Type 2 diabetes mellitus Stable on SSI. Resume metformin   History prostate cancer Continue Casodex  Moderate to severe dementia    Family Communication  : Wife and daughter at bedside  Disposition Plan  :SNF  Consults  :  Neurology  Procedures  :  MRI brain EEG   Discharge Instructions  Discharge Instructions    Ambulatory referral to Neurology    Complete by:  As directed    An appointment is requested in approximately: 4 weeks     Allergies as of 10/05/2016   No Known Allergies     Medication List    TAKE these medications   bicalutamide 50 MG tablet Commonly known as:  CASODEX Take 50 mg by mouth daily.   brimonidine 0.1 % Soln Commonly known as:  ALPHAGAN P Place 1 drop into both eyes 2 (two) times daily.   dabigatran 150 MG Caps capsule Commonly known as:  PRADAXA Take 150 mg by mouth 2 (two) times daily.   Lacosamide 100 MG Tabs Take 1 tablet (100 mg total) by mouth 2 (two) times daily.   lovastatin 40 MG tablet Commonly known as:  MEVACOR Take 40 mg by mouth daily.   metFORMIN 500 MG tablet Commonly known as:  GLUCOPHAGE Take 500 mg by mouth daily.   omeprazole 20 MG capsule Commonly known as:   PRILOSEC Take 20 mg by mouth daily.   sertraline 25 MG tablet Commonly known as:  ZOLOFT Take 1 tablet by mouth daily.   valsartan-hydrochlorothiazide 80-12.5 MG tablet Commonly known as:  DIOVAN-HCT Take 1 tablet by mouth daily.      Follow-up Information    MD at SNF in 1 week Follow up.          No Known Allergies    Procedures/Studies: Mr Jeri Cos Wo Contrast  Result Date: 10/02/2016 CLINICAL DATA:  Transient aphasia, witnessed earlier today. History of chronic atrial fibrillation, convulsive like activity. EXAM: MRI HEAD WITHOUT AND WITH CONTRAST TECHNIQUE: Multiplanar, multiecho pulse sequences of the brain and surrounding structures were obtained without and with intravenous contrast. CONTRAST:  65mL MULTIHANCE GADOBENATE DIMEGLUMINE 529 MG/ML IV SOLN COMPARISON:  CT head 10/01/2016.  MR head 03/08/2016. FINDINGS: The patient was unable to remain motionless for the exam. Small or subtle lesions could be overlooked. Brain: No restricted diffusion to indicate acute infarction. Widespread areas of chronic infarction associated with atrophic change. Hydrocephalus ex vacuo. Chronic lacunar infarcts affect both the deep nuclei as well as the white matter. Focal and confluent T2 and FLAIR signal abnormality throughout the white matter consistent with chronic microvascular ischemic change. Widespread micro bleeds, seen on gradient sequence, suggests hypertensive cerebrovascular disease as etiology. Coronal imaging through the temporal lobes demonstrates no signs of mass, infection, or inflammation. Post infusion, no abnormal enhancement of the brain or meninges. Vascular: Normal flow voids. Skull and upper cervical spine: Normal marrow signal. Sinuses/Orbits: Negative. Other: None. Compared with most recent prior, small deep white matter infarct noted that time has resolved. IMPRESSION: Atrophy and small vessel disease. 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 No acute  intracranial findings. No underlying cause for epileptiform activity is observed on this motion degraded exam. Electronically Signed   By: Staci Righter M.D.   On: 10/02/2016 19:27   Ct Head Code Stroke W/o Cm  Result Date: 10/01/2016 CLINICAL DATA:  Code stroke. 82 year old male with slurred speech and witnessed seizure. Atrial fibrillation. Dementia. Prostate cancer. Initial encounter. EXAM: CT HEAD WITHOUT CONTRAST TECHNIQUE: Contiguous axial images were obtained from the base of the skull through the vertex without intravenous contrast. COMPARISON:  03/08/2016 head CT and brain MR. FINDINGS: Brain: No intracranial hemorrhage or CT evidence of large acute infarct. Evaluation of acute infarct limited by motion. Remote left basal ganglia/ corona radiata infarct. Chronic microvascular changes. Global atrophy. Ventricular prominence unchanged and probably atrophy rather hydrocephalus. Vascular: Vascular calcifications. Middle cerebral artery branch vessels slightly dense but without change since prior exam. Skull: No acute abnormality. Sinuses/Orbits: Post left lens replacement. Minimal exophthalmos. Visualized sinuses clear. Other: Negative. ASPECTS [facility] Stroke Program Early CT Score) - Ganglionic level infarction (caudate, lentiform nuclei, internal capsule, insula, M1-M3 cortex): 7 - Supraganglionic infarction (M4-M6 cortex): 3 Total score (0-10 with 10 being normal): 10 IMPRESSION: 1. No intracranial hemorrhage or CT evidence of large acute infarct. Evaluation limited by motion. Remote left basal ganglia/corona radiata infarct. Chronic microvascular changes. Global atrophy. 2. ASPECTS is 10 These results were called by telephone at the time of interpretation on 10/01/2016 at 9:15 pm to Dr. Elnora Morrison , who verbally acknowledged these results. Electronically Signed   By: Genia Del M.D.   On: 10/01/2016 21:16  EEG Abnormal study due to diffuse low voltage slowing of the background. The  findings are consistent with diffuse cerebral dysfunction that is non-specific in etiology and can be seen with hypoxic/ischemic injury, toxic/metabolic encephalopathies, neurodegenerative disorders, or medication effect. The absence of epileptiform discharges does not rule out a clinical diagnosis of epilepsy.    Subjective: HR stable past 24 hrs. No overnight issues.still has confusion  Discharge Exam: Vitals:   10/04/16 2312 10/05/16 0333  BP: (!) 152/106 (!) 175/91  Pulse: 69 80  Resp: 19 18  Temp: 98.4 F (36.9 C) 97.3 F (36.3 C)   Vitals:   10/04/16 0214 10/04/16 0457 10/04/16 2312 10/05/16 0333  BP: 129/68 (!) 157/70 (!) 152/106 (!) 175/91  Pulse: (!) 55 60 69 80  Resp: 18 18 19 18   Temp: 97.9 F (36.6 C) 98 F (36.7 C) 98.4 F (36.9 C) 97.3 F (36.3 C)  TempSrc: Oral  Oral Oral  SpO2: 100% 100% 100% 100%  Weight:        Gen: Not in distress,  HEENT:  moist mucosa, supple neck Chest: clear b/l, no added sounds CVS: S1 and S2 irregular, no murmurs  GI: soft, NT, ND Musculoskeletal: warm, no edema CNS: AAOX1, nonfocal     The results of significant diagnostics from this hospitalization (including imaging, microbiology, ancillary and laboratory) are listed below for reference.     Microbiology: No results found for this or any previous visit (from the past 240 hour(s)).   Labs: BNP (last 3 results) No results for input(s): BNP in the last 8760 hours. Basic Metabolic Panel:  Recent Labs Lab 10/01/16 2052 10/01/16 2058 10/02/16 0723 10/03/16 0657  NA 138 139 141 139  K 3.6 3.5 3.3* 3.7  CL 102 103 105 104  CO2 16*  --  27 27  GLUCOSE 199* 192* 144* 118*  BUN 17 18 14 17   CREATININE 1.42* 1.20 1.17 1.17  CALCIUM 9.5  --  8.9 8.9   Liver Function Tests:  Recent Labs Lab 10/01/16 2052 10/02/16 0723  AST 34 31  ALT 19 18  ALKPHOS 41 34*  BILITOT 2.4* 2.7*  PROT 6.6 6.1*  ALBUMIN 4.4 3.9   No results for input(s): LIPASE, AMYLASE in  the last 168 hours. No results for input(s): AMMONIA in the last 168 hours. CBC:  Recent Labs Lab 10/01/16 2052 10/01/16 2058 10/03/16 0657  WBC 8.9  --  6.1  NEUTROABS 3.2  --   --   HGB 13.8 13.6 11.6*  HCT 41.2 40.0 34.4*  MCV 97.2  --  94.8  PLT 164  --  136*   Cardiac Enzymes: No results for input(s): CKTOTAL, CKMB, CKMBINDEX, TROPONINI in the last 168 hours. BNP: Invalid input(s): POCBNP CBG:  Recent Labs Lab 10/04/16 0758 10/04/16 1151 10/04/16 1648 10/04/16 2317 10/05/16 0807  GLUCAP 117* 155* 107* 160* 123*   D-Dimer No results for input(s): DDIMER in the last 72 hours. Hgb A1c No results for input(s): HGBA1C in the last 72 hours. Lipid Profile No results for input(s): CHOL, HDL, LDLCALC, TRIG, CHOLHDL, LDLDIRECT in the last 72 hours. Thyroid function studies  Recent Labs  10/04/16 0507  TSH 1.532   Anemia work up No results for input(s): VITAMINB12, FOLATE, FERRITIN, TIBC, IRON, RETICCTPCT in the last 72 hours. Urinalysis    Component Value Date/Time   COLORURINE YELLOW 10/01/2016 2321   APPEARANCEUR CLEAR 10/01/2016 2321   LABSPEC 1.017 10/01/2016 2321   PHURINE 5.0 10/01/2016 2321  GLUCOSEU 50 (A) 10/01/2016 2321   HGBUR SMALL (A) 10/01/2016 2321   BILIRUBINUR NEGATIVE 10/01/2016 2321   KETONESUR NEGATIVE 10/01/2016 2321   PROTEINUR 100 (A) 10/01/2016 2321   UROBILINOGEN 2.0 (H) 02/16/2015 1143   NITRITE NEGATIVE 10/01/2016 2321   LEUKOCYTESUR NEGATIVE 10/01/2016 2321   Sepsis Labs Invalid input(s): PROCALCITONIN,  WBC,  LACTICIDVEN Microbiology No results found for this or any previous visit (from the past 240 hour(s)).   Time coordinating discharge: Over 30 minutes  SIGNED:   Louellen Molder, MD  [facility] 10/05/2016, 10:50 AM Pager   If 7PM-7AM, please contact night-coverage [email or web address] Password [email or web address]

## 2016-10-05 NOTE — Progress Notes (Signed)
PT Cancellation Note  Patient Details Name: Dustin [name] MRN: [id number] DOB: Nov 01, 1926   Cancelled Treatment:    Reason Eval/Treat Not Completed: Medical issues which prohibited therapy (Entered room and patient unresponsive, checked O2 sats and HR, HR 38 bpm and O2 sats 97% on supplemental O2.  Informed RN and remained with patient until nurse arrived,  Pt going in and out of vtach, nursing arrived patient began to vomit.) Initially patient unresponsive to sternal rub on PTA arrival.     Cristela Blue 10/05/2016, 11:25 AM   [place]or [place], PTA pager [phone]

## 2016-10-05 NOTE — Significant Event (Signed)
Rapid Response Event Note  Overview: Called to bedside for increased HR Time Called: 1117 Arrival Time: 1119 Event Type: Cardiac, Hypotension  Initial Focused Assessment: Called by RN for assistance with patient having bursts of V tach.  On my arrival to patients room, Rn at bedside.  Patient sitting up in bed, eyes open, not speaking or following commands.  Patient does nod his head.   Patient appears pale and somnelent.  HR on tel box now 30-40's A fib.  SBP 86.  RR 8.    Interventions:  MD at bedside.  Fluid bolus ordered.    Plan of Care (if not transferred):  Orders given by MD.    Event Summary:  RN to monitor and call if assistance needed   at      at          [facility], Harlin Rain

## 2016-10-05 NOTE — Progress Notes (Signed)
Patient c/o chest pain, MD notified EKG ordered.

## 2016-10-06 ENCOUNTER — Inpatient Hospital Stay (HOSPITAL_COMMUNITY): Payer: Medicare PPO

## 2016-10-06 LAB — GLUCOSE, CAPILLARY
GLUCOSE-CAPILLARY: 172 mg/dL — AB (ref 65–99)
Glucose-Capillary: 163 mg/dL — ABNORMAL HIGH (ref 65–99)
Glucose-Capillary: 168 mg/dL — ABNORMAL HIGH (ref 65–99)
Glucose-Capillary: 146 mg/dL — ABNORMAL HIGH (ref 65–99)

## 2016-10-06 LAB — TROPONIN I: TROPONIN I: 0.14 ng/mL — AB (ref <0.03)

## 2016-10-06 MED ORDER — HALOPERIDOL LACTATE 5 MG/ML IJ SOLN
2.5000 mg | Freq: Once | INTRAMUSCULAR | Status: AC
  Administered 2016-10-06: 2.5 mg via INTRAMUSCULAR
  Filled 2016-10-06: qty 1

## 2016-10-06 MED ORDER — POLYETHYLENE GLYCOL 3350 17 G PO PACK
17.0000 g | PACK | Freq: Every day | ORAL | Status: DC | PRN
  Administered 2016-10-06: 17 g via ORAL
  Filled 2016-10-06: qty 1

## 2016-10-06 MED ORDER — LORAZEPAM 2 MG/ML IJ SOLN
0.2500 mg | Freq: Once | INTRAMUSCULAR | Status: AC
  Administered 2016-10-06: 0.25 mg via INTRAVENOUS
  Filled 2016-10-06: qty 1

## 2016-10-06 MED ORDER — LORAZEPAM BOLUS VIA INFUSION: 0.2500 mg | Freq: Once | INTRAVENOUS | Status: DC

## 2016-10-06 MED ORDER — HYDRALAZINE HCL 20 MG/ML IJ SOLN
10.0000 mg | Freq: Four times a day (QID) | INTRAMUSCULAR | Status: DC | PRN
  Administered 2016-10-06: 10 mg via INTRAVENOUS
  Filled 2016-10-06: qty 1

## 2016-10-06 NOTE — Progress Notes (Addendum)
PROGRESS NOTE                                                                                                                                                                                                             Patient Demographics:    Dustin [name], is a 82 y.o. male, DOB - 03/16/1927, [id number]  Admit date - 10/01/2016   Admitting Physician Lily Kocher, MD  Outpatient Primary MD for the patient is Osborne Casco, MD  LOS - 3  Outpatient Specialists:None  Chief Complaint  Patient presents with  . Code Stroke       Brief Narrative   82 year old male with multiple prior strokes with residual right-sided weakness, hypertension, hyperlipidemia, diabetes mellitus dementia history of prostate cancer, chronic A. fib on Pradaxa brought to the ED by EMS for transient aphasia while at dinner followed by a witnessed convulsion. Patient started on anticonvulsant. Hospital course prolonged with bradycardia. Was plan for discharge to SNF on 12/29 however patient developed persistent bradycardia and became hypotensive and somnolent. Transfer to stepdown unit and cardiology consulted.    Subjective:   Patient having brief episodes of bradycardia with heart rate down to high 30s. Blood pressure has improved. Overnight started on stress dose steroid for low normal cortisol. Patient is more awake alert and talkative but still confused.   Assessment  & Plan :    Principal Problem: Acute metabolic encephalopathy with? New onset seizures -Head CT unremarkable for acute findings. MRI brain shows chronic small vessel disease with diffuse cortical atrophy and findings suggestive of long-standing hypertensive encephalopathy., no focal epileptogenic lesion. -EEG with generalized slowing without acute seizure activity. No signs of infection. -Neurology consult appreciated. Was started on Trileptal for seizure  prophylaxis,switched to Vimpat given sinus bradycardia. Outpatient referral to neurology made.  -Repeat head CT done today for increased somnolence and confusion noted on 12/29 and was unremarkable for any new findings.  -PT recommended SNF.     Active Problems: Sinus bradycardia with hypotension Hospital course complicated with frequent bradycardia suspicious of sick sinus syndrome. Also has episodes of wide-complex tachycardia. Not on any rate controlling agents and Trileptal switched to Vimpat. Replenished low potassium and magnesium. -Given his age and moderate to severe dementia and is not a candidate for pacemaker. Cardiology following and further recommendations elevated. -Discussed goals of care with daughter  at bedside today and she is interested in having palliative care follow-up at the facility.   Hypotension Holding home medication. Received IV fluid bolus on 12/29. Now on stress dose steroid given low normal cortisol . Blood pressure much improved today. Ordered when necessary hydralazine.  Elevated troponin Patient complained of chest pain on 12/29 .EKG unremarkable.  Troponin peaked at 0.16. Monitor for now. Cannot use rate limiting agents.   History of multiple CVA Has residual weakness and vascular dementia. Continue statin  Chronic A. fib Continue Pradaxa.  Hypokalemia Replenished  .Type 2 diabetes mellitus Stable on SSI.   History prostate cancer Continue Casodex  Moderate to severe dementia    Family Communication :daughter at bedside  Disposition Plan:SNF possibly on 1/1 if stable.  Consults : Neurology Cardiology  Procedures :  MRI brain EEG  DVT Prophylaxis  : On Pradaxa  Lab Results  Component Value Date   PLT 136 (L) 10/03/2016    Antibiotics  :   Anti-infectives    None        Objective:   Vitals:   10/06/16 0800 10/06/16 0900 10/06/16 1000 10/06/16 1100  BP: (!) 149/93 (!) 169/93 (!) 176/93  (!) 174/89  Pulse: 79 75 72 73  Resp: (!) 24 14 (!) 22 (!) 21  Temp:    98.6 F (37 C)  TempSrc:    Oral  SpO2: 100% 96% 100% 97%  Weight:      Height:        Wt Readings from Last 3 Encounters:  10/01/16 82.1 kg (181 lb)  03/09/16 81.6 kg (179 lb 14.3 oz)  03/08/16 84.4 kg (186 lb)     Intake/Output Summary (Last 24 hours) at 10/06/16 1321 Last data filed at 10/06/16 1100  Gross per 24 hour  Intake              365 ml  Output              450 ml  Net              -85 ml     Physical Exam  Gen: not in distress, Alert and talkative but still confused HEENT:moist mucosa, supple neck Chest: clear b/l, no added sounds CVS: N S1&S2, no murmurs, rubs or gallop GI: soft, NT, ND, Musculoskeletal: warm, no edema CNS: Alert and awake, talkative but very confused    Data Review:    CBC  Recent Labs Lab 10/01/16 2052 10/01/16 2058 10/03/16 0657  WBC 8.9  --  6.1  HGB 13.8 13.6 11.6*  HCT 41.2 40.0 34.4*  PLT 164  --  136*  MCV 97.2  --  94.8  MCH 32.5  --  32.0  MCHC 33.5  --  33.7  RDW 13.0  --  13.1  LYMPHSABS 5.1*  --   --   MONOABS 0.5  --   --   EOSABS 0.1  --   --   BASOSABS 0.0  --   --     Chemistries   Recent Labs Lab 10/01/16 2052 10/01/16 2058 10/02/16 0723 10/03/16 0657 10/05/16 1142  NA 138 139 141 139 141  K 3.6 3.5 3.3* 3.7 3.5  CL 102 103 105 104 106  CO2 16*  --  27 27 26   GLUCOSE 199* 192* 144* 118* 134*  BUN 17 18 14 17 15   CREATININE 1.42* 1.20 1.17 1.17 1.25*  CALCIUM 9.5  --  8.9 8.9 9.1  MG  --   --   --   --  1.8  AST 34  --  31  --   --   ALT 19  --  18  --   --   ALKPHOS 41  --  34*  --   --   BILITOT 2.4*  --  2.7*  --   --    ------------------------------------------------------------------------------------------------------------------ No results for input(s): CHOL, HDL, LDLCALC, TRIG, CHOLHDL, LDLDIRECT in the last 72 hours.  Lab Results  Component Value Date   HGBA1C 6.6 (H) 03/09/2016    ------------------------------------------------------------------------------------------------------------------  Recent Labs  10/04/16 0507  TSH 1.532   ------------------------------------------------------------------------------------------------------------------ No results for input(s): VITAMINB12, FOLATE, FERRITIN, TIBC, IRON, RETICCTPCT in the last 72 hours.  Coagulation profile  Recent Labs Lab 10/01/16 2052 10/02/16 0723  INR 2.44 1.77    No results for input(s): DDIMER in the last 72 hours.  Cardiac Enzymes  Recent Labs Lab 10/05/16 1505 10/05/16 2008 10/06/16 0219  TROPONINI 0.16* 0.13* 0.14*   ------------------------------------------------------------------------------------------------------------------ No results found for: BNP  Inpatient Medications  Scheduled Meds: . bicalutamide  50 mg Oral Daily  . brimonidine  1 drop Both Eyes BID  . dabigatran  150 mg Oral BID  . hydrocortisone sod succinate (SOLU-CORTEF) inj  100 mg Intravenous Q8H  . insulin aspart  0-15 Units Subcutaneous TID WC  . lacosamide  100 mg Oral BID  . mouth rinse  15 mL Mouth Rinse BID  . pantoprazole  40 mg Oral Daily  . pravastatin  40 mg Oral q1800  . sertraline  25 mg Oral Daily   Continuous Infusions: PRN Meds:.acetaminophen **OR** acetaminophen (TYLENOL) oral liquid 160 mg/5 mL **OR** acetaminophen, hydrALAZINE, polyethylene glycol  Micro Results Recent Results (from the past 240 hour(s))  MRSA PCR Screening     Status: None   Collection Time: 10/05/16  2:49 PM  Result Value Ref Range Status   MRSA by PCR NEGATIVE NEGATIVE Final    Comment:        The GeneXpert MRSA Assay (FDA approved for NASAL specimens only), is one component of a comprehensive MRSA colonization surveillance program. It is not intended to diagnose MRSA infection nor to guide or monitor treatment for MRSA infections.     Radiology Reports Ct Head Wo Contrast  Result Date:  10/06/2016 CLINICAL DATA:  Ct head wo, pt confused, NOTE: 82 y/o demented male, SNF pt, with a history of CAF on Pradaxa, HTN, DM, and CVA June 2016. Echo then showed normal LVF, mild LAE. He was admitted 10/01/16 after he had an apparent "seizure" and collapsed at the table EXAM: CT HEAD WITHOUT CONTRAST TECHNIQUE: Contiguous axial images were obtained from the base of the skull through the vertex without intravenous contrast. COMPARISON:  MRI 10/02/2016 FINDINGS: Brain: No acute intracranial hemorrhage. No focal mass lesion. No CT evidence of acute infarction. No midline shift or mass effect. No hydrocephalus. Basilar cisterns are patent. There is generalized cortical atrophy. Large remote lacune infarction in the LEFT basal ganglia. Periventricular white matter hypodensities. Findings are not changed from comparison MRI . Vascular: No hyperdense vessel or unexpected calcification. Skull: Normal. Negative for fracture or focal lesion. Sinuses/Orbits: Paranasal sinuses and mastoid air cells are clear. Orbits are clear. Other: None IMPRESSION: 1. No acute intracranial findings.  No change from MRI 10/02/2016 2. Atrophy and chronic white matter microvascular disease. Electronically Signed   By: Suzy Bouchard M.D.   On: 10/06/2016 10:28   Mr Jeri Cos [id number] Contrast  Result Date: 10/02/2016 CLINICAL DATA:  Transient aphasia, witnessed earlier today.  History of chronic atrial fibrillation, convulsive like activity. EXAM: MRI HEAD WITHOUT AND WITH CONTRAST TECHNIQUE: Multiplanar, multiecho pulse sequences of the brain and surrounding structures were obtained without and with intravenous contrast. CONTRAST:  26mL MULTIHANCE GADOBENATE DIMEGLUMINE 529 MG/ML IV SOLN COMPARISON:  CT head 10/01/2016.  MR head 03/08/2016. FINDINGS: The patient was unable to remain motionless for the exam. Small or subtle lesions could be overlooked. Brain: No restricted diffusion to indicate acute infarction. Widespread areas of chronic  infarction associated with atrophic change. Hydrocephalus ex vacuo. Chronic lacunar infarcts affect both the deep nuclei as well as the white matter. Focal and confluent T2 and FLAIR signal abnormality throughout the white matter consistent with chronic microvascular ischemic change. Widespread micro bleeds, seen on gradient sequence, suggests hypertensive cerebrovascular disease as etiology. Coronal imaging through the temporal lobes demonstrates no signs of mass, infection, or inflammation. Post infusion, no abnormal enhancement of the brain or meninges. Vascular: Normal flow voids. Skull and upper cervical spine: Normal marrow signal. Sinuses/Orbits: Negative. Other: None. Compared with most recent prior, small deep white matter infarct noted that time has resolved. IMPRESSION: Atrophy and small vessel disease. 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 No acute intracranial findings. No underlying cause for epileptiform activity is observed on this motion degraded exam. Electronically Signed   By: Staci Righter M.D.   On: 10/02/2016 19:27   Ct Head Code Stroke W/o Cm  Result Date: 10/01/2016 CLINICAL DATA:  Code stroke. 82 year old male with slurred speech and witnessed seizure. Atrial fibrillation. Dementia. Prostate cancer. Initial encounter. EXAM: CT HEAD WITHOUT CONTRAST TECHNIQUE: Contiguous axial images were obtained from the base of the skull through the vertex without intravenous contrast. COMPARISON:  03/08/2016 head CT and brain MR. FINDINGS: Brain: No intracranial hemorrhage or CT evidence of large acute infarct. Evaluation of acute infarct limited by motion. Remote left basal ganglia/ corona radiata infarct. Chronic microvascular changes. Global atrophy. Ventricular prominence unchanged and probably atrophy rather hydrocephalus. Vascular: Vascular calcifications. Middle cerebral artery branch vessels slightly dense but without change since prior exam. Skull: No acute abnormality.  Sinuses/Orbits: Post left lens replacement. Minimal exophthalmos. Visualized sinuses clear. Other: Negative. ASPECTS [facility] Stroke Program Early CT Score) - Ganglionic level infarction (caudate, lentiform nuclei, internal capsule, insula, M1-M3 cortex): 7 - Supraganglionic infarction (M4-M6 cortex): 3 Total score (0-10 with 10 being normal): 10 IMPRESSION: 1. No intracranial hemorrhage or CT evidence of large acute infarct. Evaluation limited by motion. Remote left basal ganglia/corona radiata infarct. Chronic microvascular changes. Global atrophy. 2. ASPECTS is 10 These results were called by telephone at the time of interpretation on 10/01/2016 at 9:15 pm to Dr. Elnora Morrison , who verbally acknowledged these results. Electronically Signed   By: Genia Del M.D.   On: 10/01/2016 21:16    Time Spent in minutes  35   Louellen Molder M.D on 10/06/2016 at 1:21 PM  Between 7am to 7pm - Pager - [phone]  After 7pm go to [email or web address] - password [facility]  [facility] -  Office  [phone]

## 2016-10-06 NOTE — Clinical Social Work Placement (Signed)
   CLINICAL SOCIAL WORK PLACEMENT  NOTE  Date:  10/06/2016  Patient Details  Name: Dustin [name] MRN: [id number] Date of Birth: 01/19/1927  Clinical Social Work is seeking post-discharge placement for this patient at the [place] level of care (*CSW will initial, date and re-position this form in  chart as items are completed):      Patient/family provided with [place] Work Department's list of facilities offering this level of care within the geographic area requested by the patient (or if unable, by the patient's family).      Patient/family informed of their freedom to choose among providers that offer the needed level of care, that participate in Medicare, Medicaid or managed care program needed by the patient, have an available bed and are willing to accept the patient.      Patient/family informed of [place]'s ownership interest in [facility] and [facility], as well as of the fact that they are under no obligation to receive care at these facilities.  PASRR submitted to EDS on       PASRR number received on       Existing PASRR number confirmed on 10/03/16     FL2 transmitted to all facilities in geographic area requested by pt/family on 10/03/16     FL2 transmitted to all facilities within larger geographic area on       Patient informed that his/her managed care company has contracts with or will negotiate with certain facilities, including the following:        Yes   Patient/family informed of bed offers received.  Patient chooses bed at [place], [place]     Physician recommends and patient chooses bed at      Patient to be transferred to [facility] on 10/06/16.  Patient to be transferred to facility by [facility]     Patient family notified on 10/06/16 of transfer.  Name of family member notified:        PHYSICIAN Please sign FL2, Please sign DNR, Please prepare prescriptions, Please prepare  priority discharge summary, including medications     Additional Comment:    _______________________________________________ Alla German, LCSW 10/06/2016, 9:59 AM

## 2016-10-07 LAB — BASIC METABOLIC PANEL
Anion gap: 9 (ref 5–15)
BUN: 23 mg/dL — AB (ref 6–20)
CHLORIDE: 105 mmol/L (ref 101–111)
CO2: 25 mmol/L (ref 22–32)
Calcium: 9.2 mg/dL (ref 8.9–10.3)
Creatinine, Ser: 1.21 mg/dL (ref 0.61–1.24)
GFR calc non Af Amer: 52 mL/min — ABNORMAL LOW (ref >60)
Glucose, Bld: 176 mg/dL — ABNORMAL HIGH (ref 65–99)
POTASSIUM: 3.5 mmol/L (ref 3.5–5.1)
SODIUM: 139 mmol/L (ref 135–145)

## 2016-10-07 LAB — HEMOGLOBIN AND HEMATOCRIT, BLOOD
HEMATOCRIT: 41.7 % (ref 39.0–52.0)
Hemoglobin: 13.9 g/dL (ref 13.0–17.0)

## 2016-10-07 LAB — OCCULT BLOOD X 1 CARD TO LAB, STOOL: Fecal Occult Bld: POSITIVE — AB

## 2016-10-07 LAB — TYPE AND SCREEN
ABO/RH(D): A POS
ANTIBODY SCREEN: NEGATIVE

## 2016-10-07 LAB — GLUCOSE, CAPILLARY
GLUCOSE-CAPILLARY: 178 mg/dL — AB (ref 65–99)
GLUCOSE-CAPILLARY: 119 mg/dL — AB (ref 65–99)
GLUCOSE-CAPILLARY: 192 mg/dL — AB (ref 65–99)
Glucose-Capillary: 183 mg/dL — ABNORMAL HIGH (ref 65–99)

## 2016-10-07 LAB — MAGNESIUM: MAGNESIUM: 2.1 mg/dL (ref 1.7–2.4)

## 2016-10-07 MED ORDER — SODIUM CHLORIDE 0.9 % IV BOLUS (SEPSIS)
500.0000 mL | Freq: Once | INTRAVENOUS | Status: AC
  Administered 2016-10-07: 500 mL via INTRAVENOUS

## 2016-10-07 MED ORDER — QUETIAPINE FUMARATE 25 MG PO TABS
25.0000 mg | ORAL_TABLET | Freq: Every day | ORAL | Status: DC
Start: 2016-10-07 — End: 2016-10-10
  Administered 2016-10-07 – 2016-10-09 (×3): 25 mg via ORAL
  Filled 2016-10-07 (×3): qty 1

## 2016-10-07 MED ORDER — HYDRALAZINE HCL 20 MG/ML IJ SOLN
5.0000 mg | Freq: Once | INTRAMUSCULAR | Status: AC
  Administered 2016-10-07: 5 mg via INTRAVENOUS
  Filled 2016-10-07: qty 1

## 2016-10-07 MED ORDER — HALOPERIDOL LACTATE 5 MG/ML IJ SOLN
5.0000 mg | Freq: Once | INTRAMUSCULAR | Status: AC
  Administered 2016-10-07: 5 mg via INTRAVENOUS
  Filled 2016-10-07: qty 1

## 2016-10-07 NOTE — Progress Notes (Signed)
PROGRESS NOTE                                                                                                                                                                                                             Patient Demographics:    Dustin [name], is a 82 y.o. male, DOB - 01/31/1927, BK:[id number]  Admit date - 10/01/2016   Admitting Physician Lily Kocher, MD  Outpatient Primary MD for the patient is Osborne Casco, MD  LOS - 4  Outpatient Specialists:None  Chief Complaint  Patient presents with  . Code Stroke       Brief Narrative   82 year old male with multiple prior strokes with residual right-sided weakness, hypertension, hyperlipidemia, diabetes mellitus dementia history of prostate cancer, chronic A. fib on Pradaxa brought to the ED by EMS for transient aphasia while at dinner followed by a witnessed convulsion. Patient started on anticonvulsant. Hospital course prolonged with bradycardia. Was plan for discharge to SNF on 12/29 however patient developed persistent bradycardia and became hypotensive and somnolent. Transfer to stepdown unit and cardiology consulted.    Subjective:   HR improved. Occasional RVR. Patient became extremely agitated and yelling. Was given Haldol and Ativan after which he slept.  Assessment  & Plan :    Principal Problem: Acute metabolic encephalopathy with? New onset seizures -Head CT unremarkable for acute findings. MRI brain shows chronic small vessel disease with diffuse cortical atrophy and findings suggestive of long-standing hypertensive encephalopathy., no focal epileptogenic lesion. -EEG with generalized slowing without acute seizure activity. No signsOf seizures. -Neurology consult appreciated. Was started on Trileptal for seizure prophylaxis,switched to Vimpat given sinus bradycardia. Outpatient referral to neurology made.  -Repeat head CT on  12/30 due to increased confusion was negative for acute findings.  Patient agitated and confused during the night. Use Haldol with caution. I will add low-dose Seroquel at bedtime. Discontinue Zoloft.. Repeat EKG today shows normal QTC.  -PT recommended SNF.     Active Problems: Sinus bradycardia with hypotension Hospital course complicated with frequent bradycardia suspicious of sick sinus syndrome. Also has episodes of wide-complex tachycardia. Not on any rate controlling agents and Trileptal switched to Vimpat. Replenished low potassium and magnesium. -Given his age and moderate to severe dementia and is not a candidate for pacemaker. Further recommendations per cardiology pending.  -Heart rate now improved and  patient hypertensive. Was started on stress to steroid for low normal cortisol. I have discontinued it today.   Hypotension Required fluid bolus and placed on stress dose steroid. Now hypertensive and discontinued both.  Elevated troponin Patient complained of chest pain on 12/29 .EKG unremarkable.  Troponin peaked at 0.16. Monitor for now. Cannot use rate limiting agents.   History of multiple CVA Has residual weakness and vascular dementia. Continue statin  Chronic A. fib Continue Pradaxa.  Hypokalemia Replenished  .Type 2 diabetes mellitus Stable on SSI.   History prostate cancer Continue Casodex  Moderate to severe dementia  Discuss further goals of care with daughter. She was initially interested in palliative care follow-up at the facility however given persistent confusion and sundowning agrees with inpatient palliative care discussion. Consult case.  Family Communication :Spoke with daughter on the phone.  Disposition Plan:SNF possibly on 1/1 if stable.  Consults : Neurology Cardiology Palliative care  Procedures :  MRI brain EEG  DVT Prophylaxis  : On Pradaxa  Lab Results  Component Value Date   PLT 136 (L)  10/03/2016    Antibiotics  :   Anti-infectives    None        Objective:   Vitals:   10/07/16 0436 10/07/16 0758 10/07/16 0817 10/07/16 1143  BP: (!) 142/128 (!) 164/90 (!) 186/92 128/72  Pulse: 89 91 66 (!) 50  Resp: (!) 21 20 (!) 26 18  Temp: 97.9 F (36.6 C)  97.1 F (36.2 C) 97.5 F (36.4 C)  TempSrc: Axillary  Axillary Axillary  SpO2: 100% 98% 93% 99%  Weight: 83.7 kg (184 lb 8.4 oz)     Height:        Wt Readings from Last 3 Encounters:  10/07/16 83.7 kg (184 lb 8.4 oz)  03/09/16 81.6 kg (179 lb 14.3 oz)  03/08/16 84.4 kg (186 lb)     Intake/Output Summary (Last 24 hours) at 10/07/16 1205 Last data filed at 10/07/16 0300  Gross per 24 hour  Intake                0 ml  Output              775 ml  Net             -775 ml     Physical Exam  Gen: Somnolent HEENT:moist mucosa, supple neck Chest: clear b/l, no added sounds CVS: S1 and S2 irregular, no murmurs, rubs or gallop GI: soft, NT, ND, Musculoskeletal: warm, no edema CNS: Somnolent     Data Review:    CBC  Recent Labs Lab 10/01/16 2052 10/01/16 2058 10/03/16 0657  WBC 8.9  --  6.1  HGB 13.8 13.6 11.6*  HCT 41.2 40.0 34.4*  PLT 164  --  136*  MCV 97.2  --  94.8  MCH 32.5  --  32.0  MCHC 33.5  --  33.7  RDW 13.0  --  13.1  LYMPHSABS 5.1*  --   --   MONOABS 0.5  --   --   EOSABS 0.1  --   --   BASOSABS 0.0  --   --     Chemistries   Recent Labs Lab 10/01/16 2052 10/01/16 2058 10/02/16 0723 10/03/16 0657 10/05/16 1142 10/07/16 0404  NA 138 139 141 139 141 139  K 3.6 3.5 3.3* 3.7 3.5 3.5  CL 102 103 105 104 106 105  CO2 16*  --  27 27 26 25   GLUCOSE 199*  192* 144* 118* 134* 176*  BUN 17 18 14 17 15  23*  CREATININE 1.42* 1.20 1.17 1.17 1.25* 1.21  CALCIUM 9.5  --  8.9 8.9 9.1 9.2  MG  --   --   --   --  1.8 2.1  AST 34  --  31  --   --   --   ALT 19  --  18  --   --   --   ALKPHOS 41  --  34*  --   --   --   BILITOT 2.4*  --  2.7*  --   --   --     ------------------------------------------------------------------------------------------------------------------ No results for input(s): CHOL, HDL, LDLCALC, TRIG, CHOLHDL, LDLDIRECT in the last 72 hours.  Lab Results  Component Value Date   HGBA1C 6.6 (H) 03/09/2016   ------------------------------------------------------------------------------------------------------------------ No results for input(s): TSH, T4TOTAL, T3FREE, THYROIDAB in the last 72 hours.  Invalid input(s): FREET3 ------------------------------------------------------------------------------------------------------------------ No results for input(s): VITAMINB12, FOLATE, FERRITIN, TIBC, IRON, RETICCTPCT in the last 72 hours.  Coagulation profile  Recent Labs Lab 10/01/16 2052 10/02/16 0723  INR 2.44 1.77    No results for input(s): DDIMER in the last 72 hours.  Cardiac Enzymes  Recent Labs Lab 10/05/16 1505 10/05/16 2008 10/06/16 0219  TROPONINI 0.16* 0.13* 0.14*   ------------------------------------------------------------------------------------------------------------------ No results found for: BNP  Inpatient Medications  Scheduled Meds: . bicalutamide  50 mg Oral Daily  . brimonidine  1 drop Both Eyes BID  . dabigatran  150 mg Oral BID  . insulin aspart  0-15 Units Subcutaneous TID WC  . lacosamide  100 mg Oral BID  . mouth rinse  15 mL Mouth Rinse BID  . pantoprazole  40 mg Oral Daily  . pravastatin  40 mg Oral q1800   Continuous Infusions: PRN Meds:.acetaminophen **OR** acetaminophen (TYLENOL) oral liquid 160 mg/5 mL **OR** acetaminophen, hydrALAZINE, polyethylene glycol  Micro Results Recent Results (from the past 240 hour(s))  MRSA PCR Screening     Status: None   Collection Time: 10/05/16  2:49 PM  Result Value Ref Range Status   MRSA by PCR NEGATIVE NEGATIVE Final    Comment:        The GeneXpert MRSA Assay (FDA approved for NASAL specimens only), is one component of  a comprehensive MRSA colonization surveillance program. It is not intended to diagnose MRSA infection nor to guide or monitor treatment for MRSA infections.     Radiology Reports Ct Head Wo Contrast  Result Date: 10/06/2016 CLINICAL DATA:  Ct head wo, pt confused, NOTE: 82 y/o demented male, SNF pt, with a history of CAF on Pradaxa, HTN, DM, and CVA June 2016. Echo then showed normal LVF, mild LAE. He was admitted 10/01/16 after he had an apparent "seizure" and collapsed at the table EXAM: CT HEAD WITHOUT CONTRAST TECHNIQUE: Contiguous axial images were obtained from the base of the skull through the vertex without intravenous contrast. COMPARISON:  MRI 10/02/2016 FINDINGS: Brain: No acute intracranial hemorrhage. No focal mass lesion. No CT evidence of acute infarction. No midline shift or mass effect. No hydrocephalus. Basilar cisterns are patent. There is generalized cortical atrophy. Large remote lacune infarction in the LEFT basal ganglia. Periventricular white matter hypodensities. Findings are not changed from comparison MRI . Vascular: No hyperdense vessel or unexpected calcification. Skull: Normal. Negative for fracture or focal lesion. Sinuses/Orbits: Paranasal sinuses and mastoid air cells are clear. Orbits are clear. Other: None IMPRESSION: 1. No acute intracranial findings.  No change  from MRI 10/02/2016 2. Atrophy and chronic white matter microvascular disease. Electronically Signed   By: Suzy Bouchard M.D.   On: 10/06/2016 10:28   Mr Jeri Cos [id number] Contrast  Result Date: 10/02/2016 CLINICAL DATA:  Transient aphasia, witnessed earlier today. History of chronic atrial fibrillation, convulsive like activity. EXAM: MRI HEAD WITHOUT AND WITH CONTRAST TECHNIQUE: Multiplanar, multiecho pulse sequences of the brain and surrounding structures were obtained without and with intravenous contrast. CONTRAST:  86mL MULTIHANCE GADOBENATE DIMEGLUMINE 529 MG/ML IV SOLN COMPARISON:  CT head  10/01/2016.  MR head 03/08/2016. FINDINGS: The patient was unable to remain motionless for the exam. Small or subtle lesions could be overlooked. Brain: No restricted diffusion to indicate acute infarction. Widespread areas of chronic infarction associated with atrophic change. Hydrocephalus ex vacuo. Chronic lacunar infarcts affect both the deep nuclei as well as the white matter. Focal and confluent T2 and FLAIR signal abnormality throughout the white matter consistent with chronic microvascular ischemic change. Widespread micro bleeds, seen on gradient sequence, suggests hypertensive cerebrovascular disease as etiology. Coronal imaging through the temporal lobes demonstrates no signs of mass, infection, or inflammation. Post infusion, no abnormal enhancement of the brain or meninges. Vascular: Normal flow voids. Skull and upper cervical spine: Normal marrow signal. Sinuses/Orbits: Negative. Other: None. Compared with most recent prior, small deep white matter infarct noted that time has resolved. IMPRESSION: Atrophy and small vessel disease. 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 No acute intracranial findings. No underlying cause for epileptiform activity is observed on this motion degraded exam. Electronically Signed   By: Staci Righter M.D.   On: 10/02/2016 19:27   Ct Head Code Stroke W/o Cm  Result Date: 10/01/2016 CLINICAL DATA:  Code stroke. 82 year old male with slurred speech and witnessed seizure. Atrial fibrillation. Dementia. Prostate cancer. Initial encounter. EXAM: CT HEAD WITHOUT CONTRAST TECHNIQUE: Contiguous axial images were obtained from the base of the skull through the vertex without intravenous contrast. COMPARISON:  03/08/2016 head CT and brain MR. FINDINGS: Brain: No intracranial hemorrhage or CT evidence of large acute infarct. Evaluation of acute infarct limited by motion. Remote left basal ganglia/ corona radiata infarct. Chronic microvascular changes. Global atrophy.  Ventricular prominence unchanged and probably atrophy rather hydrocephalus. Vascular: Vascular calcifications. Middle cerebral artery branch vessels slightly dense but without change since prior exam. Skull: No acute abnormality. Sinuses/Orbits: Post left lens replacement. Minimal exophthalmos. Visualized sinuses clear. Other: Negative. ASPECTS [facility] Stroke Program Early CT Score) - Ganglionic level infarction (caudate, lentiform nuclei, internal capsule, insula, M1-M3 cortex): 7 - Supraganglionic infarction (M4-M6 cortex): 3 Total score (0-10 with 10 being normal): 10 IMPRESSION: 1. No intracranial hemorrhage or CT evidence of large acute infarct. Evaluation limited by motion. Remote left basal ganglia/corona radiata infarct. Chronic microvascular changes. Global atrophy. 2. ASPECTS is 10 These results were called by telephone at the time of interpretation on 10/01/2016 at 9:15 pm to Dr. Elnora Morrison , who verbally acknowledged these results. Electronically Signed   By: Genia Del M.D.   On: 10/01/2016 21:16    Time Spent in minutes  25   Louellen Molder M.D on 10/07/2016 at 12:05 PM  Between 7am to 7pm - Pager - [phone]  After 7pm go to [email or web address] - password [facility]  [facility] -  Office  [phone]

## 2016-10-07 NOTE — Progress Notes (Signed)
Patient's BP at 1956 86/49. Notified provider on call. New order  For 500cc bolus and wait until H/H lab values resulted to transfer patient. After bolus pt BP 119/87. H/H WNL. Report attempted to receiving RN. RN will call back. Will continue to monitor patient. Patient is awake and watching TV at this moment. No behaviors noted thus far.

## 2016-10-07 NOTE — Progress Notes (Signed)
When changing pt bedpad, I noted a dark red/black BM.  Collected and sent hemoccult card, waiting on result.  Notified MD.  New orders received.  Will continue to monitor.

## 2016-10-07 NOTE — Progress Notes (Signed)
Upon starting shift, pt noted from nursing station yelling out. Pt very agitated, confused and combative. Notified provider on call. New order for Haldol 2.5mg  and bilateral wrist restraints and siderails. New orders implemented. No change in patient's behavior. Spoke with patient's daughter, she mentioned that patient gets this way when he has not slept. Recommended that we give him Ativan. Notified provider on call. Ativan 0.25mg  ordered and given. Patient stated this was a scam and he refused to take any meds because it was poison. Pt eventually took meds after lots of verbal reassurance. BP noted with elevated BP (will not stay still). Patient continues to be awake talking to himself and trying to get out of restraints. Pt has old bruising to BUE before restraints were applied. Notified provider once more for new order of Haldol and antihypertensive. Will continue to monitor.

## 2016-10-07 NOTE — Clinical Social Work Note (Signed)
Patient will need new insurance authorization before patient is able to discharge to [facility], current authorization only good through 10/07/16.  CSW contacted [facility] who said they will need reauthorization before they can accept patient.  Jones Broom. [place], MSW, [place] Weekend social work 10/07/2016 5:49 PM

## 2016-10-07 NOTE — Progress Notes (Signed)
Called pt's daughter and made her aware that patient had transferred to 5W and is settled in room 15. Patient is stable and is awake and talking. Calm at the time of transferred.

## 2016-10-08 DIAGNOSIS — R001 Bradycardia, unspecified: Secondary | ICD-10-CM

## 2016-10-08 DIAGNOSIS — Z515 Encounter for palliative care: Secondary | ICD-10-CM

## 2016-10-08 DIAGNOSIS — Z66 Do not resuscitate: Secondary | ICD-10-CM

## 2016-10-08 LAB — ABO/RH: ABO/RH(D): A POS

## 2016-10-08 LAB — GLUCOSE, CAPILLARY
Glucose-Capillary: 127 mg/dL — ABNORMAL HIGH (ref 65–99)
Glucose-Capillary: 127 mg/dL — ABNORMAL HIGH (ref 65–99)

## 2016-10-08 MED ORDER — MORPHINE SULFATE (CONCENTRATE) 10 MG/0.5ML PO SOLN
5.0000 mg | ORAL | Status: DC | PRN
  Administered 2016-10-08 – 2016-10-09 (×2): 5 mg via ORAL
  Filled 2016-10-08 (×3): qty 0.5

## 2016-10-08 MED ORDER — SODIUM CHLORIDE 0.9 % IV SOLN: INTRAVENOUS | Status: DC

## 2016-10-08 NOTE — Final Progress Note (Signed)
Patient seen and examined and history reviewed.  Consulted for management of Afib with slow ventricular response. Some wide complex rhythm noted initially. Now in Afib with controlled rate. On no rate slowing meds. HR will transiently drop into the 40s but no significant pauses and no VT.  Patient has advanced dementia and plan is for palliative care approach.   He is not a candidate for pacemaker due to age, dementia and multiple co-morbidities  He developed Hypotension last pm responding to fluid bolus. Hgb stable >13. Dark/red stool noted that is heme positive. Pradaxa now on hold.   If palliative care approach is taken may want to consider stopping anticoagulation.   Nothing further to add from a cardiac standpoint. Please call if needed.  Peter Martinique MD, [facility]  10/08/2016 7:41 AM'

## 2016-10-08 NOTE — Progress Notes (Signed)
PROGRESS NOTE                                                                                                                                                                                                             Patient Demographics:    Dustin [name], is a 83 y.o. male, DOB - January 22, 1927, [id number]  Admit date - 10/01/2016   Admitting Physician Lily Kocher, MD  Outpatient Primary MD for the patient is Osborne Casco, MD  LOS - 5  Outpatient Specialists:None  Chief Complaint  Patient presents with  . Code Stroke       Brief Narrative   83 year old male with multiple prior strokes with residual right-sided weakness, hypertension, hyperlipidemia, diabetes mellitus dementia history of prostate cancer, chronic A. fib on Pradaxa brought to the ED by EMS for transient aphasia while at dinner followed by a witnessed convulsion. Patient started on anticonvulsant. Hospital course prolonged with bradycardia. Was plan for discharge to SNF on 12/29 however patient developed persistent bradycardia and became hypotensive and somnolent. Transfer to stepdown unit and cardiology consulted.    Subjective:   Had low blood pressure again yesterday and given 500 mL normal saline bolus. Also had an episode of bright red blood per rectum, Hemoccult positive. Pradaxa held. Still confused but knows he is in the hospital and not agitated.  Assessment  & Plan :    Principal Problem: Acute metabolic encephalopathy with? New onset seizures -Head CT unremarkable for acute findings. MRI brain shows chronic small vessel disease with diffuse cortical atrophy and findings suggestive of long-standing hypertensive encephalopathy., no focal epileptogenic lesion. -EEG with generalized slowing without acute seizure activity. No signsOf seizures. -Neurology consult appreciated. Was started on Trileptal for seizure prophylaxis,switched  to Vimpat given sinus bradycardia. Outpatient referral to neurology made.  -Repeat head CT on 12/30 due to increased confusion was negative for acute findings.  Added bedtime Seroquel for agitation and sundowning. Stable past 24 hours.   -PT recommended SNF. Palliative care to consult.    Active Problems: Sinus bradycardia with hypotension Hospital course complicated with frequent bradycardia suspicious of sick sinus syndrome. Also has episodes of wide-complex tachycardia. Not on any rate controlling agents and Trileptal switched to Vimpat. Replenished low potassium and magnesium. -Given his age and moderate to severe dementia and is not a candidate for pacemaker.-Heart rate has  been stable.  Acute kidney injury Mild. Monitor with gentle hydration.   Bright red blood per rectum Hemoccult positive. H&H stable. Holding Pradaxa.  Elevated troponin Patient complained of chest pain on 12/29 .EKG unremarkable.  Troponin peaked at 0.16. No further intervention. Cannot use rate limiting agents.   History of multiple CVA Has residual weakness and vascular dementia. Continue statin  Chronic A. fib Holding Pradaxa due to rectal bleed on 12/31.  Hypokalemia Replenished  Type 2 diabetes mellitus Stable on SSI.   History prostate cancer Continue Casodex  Moderate to severe dementia  Discuss further goals of care with daughter. She was initially interested in palliative care follow-up at the facility however given persistent confusion and sundowning agrees with inpatient palliative care discussion. Palliative care team will meet with family while patient in the hospital.  Family Communication :None at bedside  Disposition Plan:SNF possibly on 1/1 if stable.  Consults : Neurology Cardiology Palliative care  Procedures :  MRI brain EEG  DVT Prophylaxis  : On Pradaxa  Lab Results  Component Value Date   PLT 136 (L) 10/03/2016    Antibiotics   :   Anti-infectives    None        Objective:   Vitals:   10/07/16 1956 10/07/16 2130 10/07/16 2234 10/08/16 0510  BP: (!) 86/49 119/87 (!) 146/55 (!) 144/67  Pulse: 60 91 84 (!) 56  Resp: 14 (!) 25 19 16   Temp: 98.7 F (37.1 C) 98.6 F (37 C)  98.1 F (36.7 C)  TempSrc: Oral Oral  Oral  SpO2: 100% 100% 92%   Weight:      Height:        Wt Readings from Last 3 Encounters:  10/07/16 83.7 kg (184 lb 8.4 oz)  03/09/16 81.6 kg (179 lb 14.3 oz)  03/08/16 84.4 kg (186 lb)     Intake/Output Summary (Last 24 hours) at 10/08/16 1133 Last data filed at 10/08/16 1003  Gross per 24 hour  Intake              510 ml  Output              525 ml  Net              -15 ml     Physical Exam  Gen: Arousable, confused HEENT:moist mucosa, supple neck Chest: clear b/l, no added sounds CVS: S1 and S2 irregular, no murmurs,  GI: soft, NT, ND, Musculoskeletal: warm, no edema CNS: not agitated, confused, oriented to place.    Data Review:    CBC  Recent Labs Lab 10/01/16 2052 10/01/16 2058 10/03/16 0657 10/07/16 1907  WBC 8.9  --  6.1  --   HGB 13.8 13.6 11.6* 13.9  HCT 41.2 40.0 34.4* 41.7  PLT 164  --  136*  --   MCV 97.2  --  94.8  --   MCH 32.5  --  32.0  --   MCHC 33.5  --  33.7  --   RDW 13.0  --  13.1  --   LYMPHSABS 5.1*  --   --   --   MONOABS 0.5  --   --   --   EOSABS 0.1  --   --   --   BASOSABS 0.0  --   --   --     Chemistries   Recent Labs Lab 10/01/16 2052 10/01/16 2058 10/02/16 0723 10/03/16 0657 10/05/16 1142 10/07/16 0404  NA 138 139  141 139 141 139  K 3.6 3.5 3.3* 3.7 3.5 3.5  CL 102 103 105 104 106 105  CO2 16*  --  27 27 26 25   GLUCOSE 199* 192* 144* 118* 134* 176*  BUN 17 18 14 17 15  23*  CREATININE 1.42* 1.20 1.17 1.17 1.25* 1.21  CALCIUM 9.5  --  8.9 8.9 9.1 9.2  MG  --   --   --   --  1.8 2.1  AST 34  --  31  --   --   --   ALT 19  --  18  --   --   --   ALKPHOS 41  --  34*  --   --   --   BILITOT 2.4*  --  2.7*  --   --    --    ------------------------------------------------------------------------------------------------------------------ No results for input(s): CHOL, HDL, LDLCALC, TRIG, CHOLHDL, LDLDIRECT in the last 72 hours.  Lab Results  Component Value Date   HGBA1C 6.6 (H) 03/09/2016   ------------------------------------------------------------------------------------------------------------------ No results for input(s): TSH, T4TOTAL, T3FREE, THYROIDAB in the last 72 hours.  Invalid input(s): FREET3 ------------------------------------------------------------------------------------------------------------------ No results for input(s): VITAMINB12, FOLATE, FERRITIN, TIBC, IRON, RETICCTPCT in the last 72 hours.  Coagulation profile  Recent Labs Lab 10/01/16 2052 10/02/16 0723  INR 2.44 1.77    No results for input(s): DDIMER in the last 72 hours.  Cardiac Enzymes  Recent Labs Lab 10/05/16 1505 10/05/16 2008 10/06/16 0219  TROPONINI 0.16* 0.13* 0.14*   ------------------------------------------------------------------------------------------------------------------ No results found for: BNP  Inpatient Medications  Scheduled Meds: . bicalutamide  50 mg Oral Daily  . brimonidine  1 drop Both Eyes BID  . insulin aspart  0-15 Units Subcutaneous TID WC  . lacosamide  100 mg Oral BID  . mouth rinse  15 mL Mouth Rinse BID  . pantoprazole  40 mg Oral Daily  . pravastatin  40 mg Oral q1800  . QUEtiapine  25 mg Oral QHS   Continuous Infusions: PRN Meds:.acetaminophen **OR** acetaminophen (TYLENOL) oral liquid 160 mg/5 mL **OR** acetaminophen, hydrALAZINE, polyethylene glycol  Micro Results Recent Results (from the past 240 hour(s))  MRSA PCR Screening     Status: None   Collection Time: 10/05/16  2:49 PM  Result Value Ref Range Status   MRSA by PCR NEGATIVE NEGATIVE Final    Comment:        The GeneXpert MRSA Assay (FDA approved for NASAL specimens only), is one  component of a comprehensive MRSA colonization surveillance program. It is not intended to diagnose MRSA infection nor to guide or monitor treatment for MRSA infections.     Radiology Reports Ct Head Wo Contrast  Result Date: 10/06/2016 CLINICAL DATA:  Ct head wo, pt confused, NOTE: 83 y/o demented male, SNF pt, with a history of CAF on Pradaxa, HTN, DM, and CVA June 2016. Echo then showed normal LVF, mild LAE. He was admitted 10/01/16 after he had an apparent "seizure" and collapsed at the table EXAM: CT HEAD WITHOUT CONTRAST TECHNIQUE: Contiguous axial images were obtained from the base of the skull through the vertex without intravenous contrast. COMPARISON:  MRI 10/02/2016 FINDINGS: Brain: No acute intracranial hemorrhage. No focal mass lesion. No CT evidence of acute infarction. No midline shift or mass effect. No hydrocephalus. Basilar cisterns are patent. There is generalized cortical atrophy. Large remote lacune infarction in the LEFT basal ganglia. Periventricular white matter hypodensities. Findings are not changed from comparison MRI . Vascular: No hyperdense vessel or  unexpected calcification. Skull: Normal. Negative for fracture or focal lesion. Sinuses/Orbits: Paranasal sinuses and mastoid air cells are clear. Orbits are clear. Other: None IMPRESSION: 1. No acute intracranial findings.  No change from MRI 10/02/2016 2. Atrophy and chronic white matter microvascular disease. Electronically Signed   By: Suzy Bouchard M.D.   On: 10/06/2016 10:28   Mr Jeri Cos [id number] Contrast  Result Date: 10/02/2016 CLINICAL DATA:  Transient aphasia, witnessed earlier today. History of chronic atrial fibrillation, convulsive like activity. EXAM: MRI HEAD WITHOUT AND WITH CONTRAST TECHNIQUE: Multiplanar, multiecho pulse sequences of the brain and surrounding structures were obtained without and with intravenous contrast. CONTRAST:  81mL MULTIHANCE GADOBENATE DIMEGLUMINE 529 MG/ML IV SOLN COMPARISON:  CT  head 10/01/2016.  MR head 03/08/2016. FINDINGS: The patient was unable to remain motionless for the exam. Small or subtle lesions could be overlooked. Brain: No restricted diffusion to indicate acute infarction. Widespread areas of chronic infarction associated with atrophic change. Hydrocephalus ex vacuo. Chronic lacunar infarcts affect both the deep nuclei as well as the white matter. Focal and confluent T2 and FLAIR signal abnormality throughout the white matter consistent with chronic microvascular ischemic change. Widespread micro bleeds, seen on gradient sequence, suggests hypertensive cerebrovascular disease as etiology. Coronal imaging through the temporal lobes demonstrates no signs of mass, infection, or inflammation. Post infusion, no abnormal enhancement of the brain or meninges. Vascular: Normal flow voids. Skull and upper cervical spine: Normal marrow signal. Sinuses/Orbits: Negative. Other: None. Compared with most recent prior, small deep white matter infarct noted that time has resolved. IMPRESSION: Atrophy and small vessel disease. 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 No acute intracranial findings. No underlying cause for epileptiform activity is observed on this motion degraded exam. Electronically Signed   By: Staci Righter M.D.   On: 10/02/2016 19:27   Ct Head Code Stroke W/o Cm  Result Date: 10/01/2016 CLINICAL DATA:  Code stroke. 83 year old male with slurred speech and witnessed seizure. Atrial fibrillation. Dementia. Prostate cancer. Initial encounter. EXAM: CT HEAD WITHOUT CONTRAST TECHNIQUE: Contiguous axial images were obtained from the base of the skull through the vertex without intravenous contrast. COMPARISON:  03/08/2016 head CT and brain MR. FINDINGS: Brain: No intracranial hemorrhage or CT evidence of large acute infarct. Evaluation of acute infarct limited by motion. Remote left basal ganglia/ corona radiata infarct. Chronic microvascular changes. Global  atrophy. Ventricular prominence unchanged and probably atrophy rather hydrocephalus. Vascular: Vascular calcifications. Middle cerebral artery branch vessels slightly dense but without change since prior exam. Skull: No acute abnormality. Sinuses/Orbits: Post left lens replacement. Minimal exophthalmos. Visualized sinuses clear. Other: Negative. ASPECTS [facility] Stroke Program Early CT Score) - Ganglionic level infarction (caudate, lentiform nuclei, internal capsule, insula, M1-M3 cortex): 7 - Supraganglionic infarction (M4-M6 cortex): 3 Total score (0-10 with 10 being normal): 10 IMPRESSION: 1. No intracranial hemorrhage or CT evidence of large acute infarct. Evaluation limited by motion. Remote left basal ganglia/corona radiata infarct. Chronic microvascular changes. Global atrophy. 2. ASPECTS is 10 These results were called by telephone at the time of interpretation on 10/01/2016 at 9:15 pm to Dr. Elnora Morrison , who verbally acknowledged these results. Electronically Signed   By: Genia Del M.D.   On: 10/01/2016 21:16    Time Spent in minutes  25   Louellen Molder M.D on 10/08/2016 at 11:33 AM  Between 7am to 7pm - Pager - [phone]  After 7pm go to [email or web address] - password [facility]  [facility] -  Office  [phone]

## 2016-10-08 NOTE — Progress Notes (Signed)
Orders placed for IV fluids -- per night RN -- patient had failed IV access when he arrived to the floor last night and the IV nurse said the patient had no place to get a peripheral IV at this time. Orders were received to leave IV out. I notified Dr. Clementeen Graham of this situation and told to wait until palliative care meeting at [place], [place]

## 2016-10-08 NOTE — Consult Note (Signed)
Consultation Note Date: 10/08/2016   Patient Name: Dustin [name]  DOB: [birth year]  MRN: [id number]  Age / Sex: 83 y.o., male  PCP: Kelton Pillar, MD Referring Physician: Louellen Molder, MD  Reason for Consultation: Establishing goals of care and Psychosocial/spiritual support  HPI/Patient Profile: 83 y.o. male   admitted on 10/01/2016 with a history of multiple prior strokes, residual right sided weakness at baseline, HTN, HLD, DM, dementia, prostate cancer, and chronic atrial fibrillation (CHADS-Vasc score of 6, anticoagulated with Pradaxa) who presents to the ED via EMS for evaluation of transient aphasia, witnessed by his wife during dinner, followed by reported convulsions concerning for new onset seizure activity.    Per his daughter, the patient has appeared to be in his baseline state of health except for recent urinary frequency.  No dysuria.  No fever.  No falls or LOC.  No known head trauma.  No recent medication changes (stopped Aricept on his own due to perceived side effects).  Per family patient hs had continued physical, functional and cognitive decline.  Family faces advanced directive deicsions and anticipatory care needs.   Clinical Assessment and Goals of Care:   This NP Wadie Lessen reviewed medical records, received report from team, assessed the patient and then meet at the patient's bedside along with his daughter Idalia Needle and her husband  to discuss diagnosis, prognosis, GOC, EOL wishes disposition and options.  A detailed discussion was had today regarding advanced directives.  Concepts specific to code status, artifical feeding and hydration, continued IV antibiotics and rehospitalization was had.  The difference between a aggressive medical intervention path  and a palliative comfort care path for this patient at this time was had.  Values and goals of care important to  patient and family were attempted to be elicited.  MOST form introduced.  Hard Choices left for review  Concept of Hospice and Palliative Care were discussed  Natural trajectory and expectations at EOL were discussed.  Questions and concerns addressed.   Family encouraged to call with questions or concerns.  PMT will continue to support holistically.  HCPOA / Carla/daughter  SUMMARY OF RECOMMENDATIONS   Focus is comfort, quality and digntiy  Code Status/Advance Care Planning:   DNR   Comfort feeds with known risk of aspiration  Symptom management  to enhance comfort   Symptom Management:   Pain/Dyspnea: Roxanol 5 mg po/sl every 1 hr prn  Palliative Prophylaxis:   Aspiration, Bowel Regimen, Delirium Protocol, Frequent Pain Assessment and Oral Care  Additional Recommendations (Limitations, Scope, Preferences):  Avoid Hospitalization, Minimize Medications, No Artificial Feeding, No Blood Transfusions, No Glucose Monitoring, No IV Antibiotics, No IV Fluids and No Lab Draws  Psycho-social/Spiritual:   Desire for further Chaplaincy support:no  Additional Recommendations: Education on Hospice and Grief/Bereavement Support  Prognosis:   With shift to full comfort, no artificial hydration, no antibiotics, lab correction; poor po intake patient will likely decline quickly.    Discharge Planning:  Family to re-meet tomorrow at 3 pm for further clarification of [place]  and disposition options.  Hopeful for hospice facility.   To Be Determined      Primary Diagnoses: Present on Admission: . Dementia . Essential hypertension . Chronic atrial fibrillation ([place]) . Encephalopathy, hypertensive . Pressure injury of skin   I have reviewed the medical record, interviewed the patient and family, and examined the patient. The following aspects are pertinent.  Past Medical History:  Diagnosis Date  . Arthritis   . Bilateral knee pain   . Cancer ([place]) 2012   skin cancers  .  Chronic atrial fibrillation ([facility])    a. CHA2DS2VASc = 6-->pradaxa;  b. 03/2016 Echo: EF 60-65%, no rwma, mildly dil LA.  [name] Dementia   . Diabetes mellitus without complication ([place])   . Hyperlipidemia   . Hypertensive heart disease   . Pancreatitis   . Poor balance   . Prostate cancer ([place])   . Stroke [facility])    a. 02/2016 Left lentiform nucleus/corona radiata infarcts no MRI.   Social History   Social History  . Marital status: Married    Spouse name: N/A  . Number of children: N/A  . Years of education: N/A   Social History Main Topics  . Smoking status: Former Research scientist (life sciences)  . Smokeless tobacco: Never Used  . Alcohol use 1.2 oz/week    1 Cans of beer, 1 Glasses of wine per week     Comment: "a beer once in a while"  . Drug use: No  . Sexual activity: Not Asked   Other Topics Concern  . None   Social History Narrative   Lives in [place].   Family History  Problem Relation Age of Onset  . Stroke Father   . Colon cancer Father   . Diabetes Mellitus II Sister   . Heart attack Neg Hx   . Prostate cancer Neg Hx    Scheduled Meds: . bicalutamide  50 mg Oral Daily  . brimonidine  1 drop Both Eyes BID  . insulin aspart  0-15 Units Subcutaneous TID WC  . lacosamide  100 mg Oral BID  . mouth rinse  15 mL Mouth Rinse BID  . pantoprazole  40 mg Oral Daily  . pravastatin  40 mg Oral q1800  . QUEtiapine  25 mg Oral QHS   Continuous Infusions: . sodium chloride     PRN Meds:.acetaminophen **OR** acetaminophen (TYLENOL) oral liquid 160 mg/5 mL **OR** acetaminophen, hydrALAZINE, polyethylene glycol Medications Prior to Admission:  Prior to Admission medications   Medication Sig Start Date End Date Taking? Authorizing Provider  bicalutamide (CASODEX) 50 MG tablet Take 50 mg by mouth daily.   Yes Historical Provider, MD  brimonidine (ALPHAGAN P) 0.1 % SOLN Place 1 drop into both eyes 2 (two) times daily.    Yes Historical Provider, MD  dabigatran (PRADAXA) 150 MG CAPS capsule Take 150 mg  by mouth 2 (two) times daily.   Yes Historical Provider, MD  lovastatin (MEVACOR) 40 MG tablet Take 40 mg by mouth daily.    Yes Historical Provider, MD  metFORMIN (GLUCOPHAGE) 500 MG tablet Take 500 mg by mouth daily.   Yes Historical Provider, MD  omeprazole (PRILOSEC) 20 MG capsule Take 20 mg by mouth daily.   Yes Historical Provider, MD  sertraline (ZOLOFT) 25 MG tablet Take 1 tablet by mouth daily. 01/03/16  Yes Historical Provider, MD  valsartan-hydrochlorothiazide (DIOVAN-HCT) 80-12.5 MG per tablet Take 1 tablet by mouth daily.   Yes Historical Provider, MD  lacosamide 100 MG TABS Take 1 tablet (  100 mg total) by mouth 2 (two) times daily. 10/05/16   Nishant Dhungel, MD   No Known Allergies Review of Systems  Unable to perform ROS: Mental status change    Physical Exam  Constitutional: He appears well-developed. He appears ill.  HENT:  Mouth/Throat: Mucous membranes are dry.  Cardiovascular: Bradycardia present.   Pulmonary/Chest: He has decreased breath sounds in the right lower field and the left lower field.  Musculoskeletal:  -generalized weakness -BLE muscle atrophy  Neurological: He is disoriented. He displays atrophy.  Skin: Skin is warm and dry.  Psychiatric: Cognition and memory are impaired. He is inattentive.    Vital Signs: BP (!) 144/67 (BP Location: Right Arm)   Pulse (!) 56   Temp 98.1 F (36.7 C) (Oral)   Resp 16   Ht 5\' 10"  (1.778 m)   Wt 83.7 kg (184 lb 8.4 oz)   SpO2 92%   BMI 26.48 kg/m  Pain Assessment: 0-10 POSS *See Group Information*: 1-Acceptable,Awake and alert Pain Score: Asleep   SpO2: SpO2: 92 % O2 Device:SpO2: 92 % O2 Flow Rate: .O2 Flow Rate (L/min): 2 L/min  IO: Intake/output summary:  Intake/Output Summary (Last 24 hours) at 10/08/16 1234 Last data filed at 10/08/16 1003  Gross per 24 hour  Intake              510 ml  Output              525 ml  Net              -15 ml    LBM: Last BM Date: 10/07/16 Baseline Weight: Weight:  82.1 kg (181 lb) Most recent weight: Weight: 83.7 kg (184 lb 8.4 oz)      Palliative Assessment/Data: 30 % at best   Discussed with Dr Clementeen Graham  Time In: 1300 Time Out: 1415 Time Total: 75 min Greater than 50%  of this time was spent counseling and coordinating care related to the above assessment and plan.  Signed by: Wadie Lessen, NP   Please contact Palliative Medicine Team phone at [phone] for questions and concerns.  For individual provider: See Shea Evans

## 2016-10-09 DIAGNOSIS — R627 Adult failure to thrive: Secondary | ICD-10-CM | POA: Diagnosis present

## 2016-10-09 MED ORDER — MORPHINE SULFATE (CONCENTRATE) 10 MG/0.5ML PO SOLN
5.0000 mg | Freq: Four times a day (QID) | ORAL | Status: DC
  Administered 2016-10-09 – 2016-10-10 (×6): 5 mg via ORAL
  Filled 2016-10-09 (×5): qty 0.5

## 2016-10-09 NOTE — Consult Note (Signed)
Dustin [name] wound consult note Reason for Consult: ? DTI on sacrum Wound type: Stage 1 pressure injury Pressure Ulcer POA: No Measurement: 14cm x 10cm x 0cm Wound bed: non blanchable redness  Drainage (amount, consistency, odor) none Periwound: intact  Dressing procedure/placement/frequency: Add silicone foam dressing.  Add low air loss mattress, patient does not turn.   Discussed POC with patient and bedside [name].  Re consult if needed, will not follow at this time. Thanks  Raffi Milstein [employer], RN,CWOCN, CNS [phone])

## 2016-10-09 NOTE — Progress Notes (Signed)
PROGRESS NOTE                                                                                                                                                                                                             Patient Demographics:    Dustin [name], is a 83 y.o. male, DOB - 07-01-1927, [id number]  Admit date - 10/01/2016   Admitting Physician Lily Kocher, MD  Outpatient Primary MD for the patient is Osborne Casco, MD  LOS - 6  Outpatient Specialists:None  Chief Complaint  Patient presents with  . Code Stroke       Brief Narrative   83 year old male with multiple prior strokes with residual right-sided weakness, hypertension, hyperlipidemia, diabetes mellitus dementia history of prostate cancer, chronic A. fib on Pradaxa brought to the ED by EMS for transient aphasia while at dinner followed by a witnessed convulsion. Patient started on anticonvulsant. Hospital course prolonged with bradycardia. Was plan for discharge to SNF on 12/29 however patient developed persistent bradycardia and became hypotensive and somnolent. Transfer to stepdown unit and cardiology consulted.    Subjective:   Stable overnight. Eating this morning and better communicative  Assessment  & Plan :    Principal Problem: Acute metabolic encephalopathy with? New onset seizures -Head CT unremarkable for acute findings. MRI brain shows chronic small vessel disease with diffuse cortical atrophy and findings suggestive of long-standing hypertensive encephalopathy., no focal epileptogenic lesion. -EEG with generalized slowing without acute seizure activity. No signsOf seizures. -Neurology consult appreciated. Was started on Trileptal for seizure prophylaxis,switched to Vimpat given sinus bradycardia. Outpatient referral to neurology made.  -Repeat head CT on 12/30 due to increased confusion was negative for acute  findings.  Added bedtime Seroquel for agitation and sundowning. Stable past 24 hours.   -PT recommended SNF.   palliative care consulted who d/w family. They are inclined towards comfort measures and quality of life. Minimized meds, off tele. Will meet again with family today to determine disposition ( SNF vs residential hospice)    Active Problems: Sinus bradycardia with hypotension Hospital course complicated with frequent bradycardia suspicious of sick sinus syndrome. Also has episodes of wide-complex tachycardia. Not on any rate controlling agents and Trileptal switched to Vimpat. Replenished low potassium and magnesium. -Given his age and moderate to severe dementia and is not a candidate for pacemaker. -Heart  rate has been stable.  Acute kidney injury Mild. improved   Bright red blood per rectum Hemoccult positive. H&H stable. D/c Pradaxa.  Elevated troponin Patient complained of chest pain on 12/29 .EKG unremarkable.  Troponin peaked at 0.16. No further intervention. Cannot use rate limiting agents.   History of multiple CVA Has residual weakness and vascular dementia. Continue statin  Chronic A. fib Holding Pradaxa due to rectal bleed on 12/31.  Hypokalemia Replenished  Type 2 diabetes mellitus Stable on SSI.   History prostate cancer Continue Casodex  Moderate to severe dementia    Family Communication :None at bedside  Disposition Plan:SNF vs residential hospice on 1/3  Consults : Neurology Cardiology Palliative care  Procedures :  MRI brain EEG  DVT Prophylaxis  : SCD  Lab Results  Component Value Date   PLT 136 (L) 10/03/2016    Antibiotics  :   Anti-infectives    None        Objective:   Vitals:   10/08/16 1529 10/08/16 2221 10/09/16 0631 10/09/16 1305  BP: 102/61 (!) 157/66 (!) 171/87 (!) 113/52  Pulse: 62 78 68 (!) 53  Resp: 18 19 18 18   Temp:  98.5 F (36.9 C) 98.4 F (36.9 C) 98.3 F (36.8 C)   TempSrc:  Oral Oral Oral  SpO2:  99% 94% 99%  Weight:      Height:        Wt Readings from Last 3 Encounters:  10/07/16 83.7 kg (184 lb 8.4 oz)  03/09/16 81.6 kg (179 lb 14.3 oz)  03/08/16 84.4 kg (186 lb)     Intake/Output Summary (Last 24 hours) at 10/09/16 1648 Last data filed at 10/09/16 1300  Gross per 24 hour  Intake              320 ml  Output                0 ml  Net              320 ml     Physical Exam  Gen: confused but more alert and communicative HEENT:moist mucosa, supple neck Chest: clear b/l, no added sounds CVS: S1 and S2 irregular, no murmurs,  GI: soft, NT, ND, Musculoskeletal: warm, no edema CNS: AAOX1    Data Review:    CBC  Recent Labs Lab 10/03/16 0657 10/07/16 1907  WBC 6.1  --   HGB 11.6* 13.9  HCT 34.4* 41.7  PLT 136*  --   MCV 94.8  --   MCH 32.0  --   MCHC 33.7  --   RDW 13.1  --     Chemistries   Recent Labs Lab 10/03/16 0657 10/05/16 1142 10/07/16 0404  NA 139 141 139  K 3.7 3.5 3.5  CL 104 106 105  CO2 27 26 25   GLUCOSE 118* 134* 176*  BUN 17 15 23*  CREATININE 1.17 1.25* 1.21  CALCIUM 8.9 9.1 9.2  MG  --  1.8 2.1   ------------------------------------------------------------------------------------------------------------------ No results for input(s): CHOL, HDL, LDLCALC, TRIG, CHOLHDL, LDLDIRECT in the last 72 hours.  Lab Results  Component Value Date   HGBA1C 6.6 (H) 03/09/2016   ------------------------------------------------------------------------------------------------------------------ No results for input(s): TSH, T4TOTAL, T3FREE, THYROIDAB in the last 72 hours.  Invalid input(s): FREET3 ------------------------------------------------------------------------------------------------------------------ No results for input(s): VITAMINB12, FOLATE, FERRITIN, TIBC, IRON, RETICCTPCT in the last 72 hours.  Coagulation profile No results for input(s): INR, PROTIME in the last 168 hours.  No  results for  input(s): DDIMER in the last 72 hours.  Cardiac Enzymes  Recent Labs Lab 10/05/16 1505 10/05/16 2008 10/06/16 0219  TROPONINI 0.16* 0.13* 0.14*   ------------------------------------------------------------------------------------------------------------------ No results found for: BNP  Inpatient Medications  Scheduled Meds: . bicalutamide  50 mg Oral Daily  . brimonidine  1 drop Both Eyes BID  . lacosamide  100 mg Oral BID  . mouth rinse  15 mL Mouth Rinse BID  . morphine CONCENTRATE  5 mg Oral Q6H  . QUEtiapine  25 mg Oral QHS   Continuous Infusions: PRN Meds:.acetaminophen **OR** acetaminophen (TYLENOL) oral liquid 160 mg/5 mL **OR** acetaminophen, hydrALAZINE, morphine CONCENTRATE, polyethylene glycol  Micro Results Recent Results (from the past 240 hour(s))  MRSA PCR Screening     Status: None   Collection Time: 10/05/16  2:49 PM  Result Value Ref Range Status   MRSA by PCR NEGATIVE NEGATIVE Final    Comment:        The GeneXpert MRSA Assay (FDA approved for NASAL specimens only), is one component of a comprehensive MRSA colonization surveillance program. It is not intended to diagnose MRSA infection nor to guide or monitor treatment for MRSA infections.     Radiology Reports Ct Head Wo Contrast  Result Date: 10/06/2016 CLINICAL DATA:  Ct head wo, pt confused, NOTE: 83 y/o demented male, SNF pt, with a history of CAF on Pradaxa, HTN, DM, and CVA June 2016. Echo then showed normal LVF, mild LAE. He was admitted 10/01/16 after he had an apparent "seizure" and collapsed at the table EXAM: CT HEAD WITHOUT CONTRAST TECHNIQUE: Contiguous axial images were obtained from the base of the skull through the vertex without intravenous contrast. COMPARISON:  MRI 10/02/2016 FINDINGS: Brain: No acute intracranial hemorrhage. No focal mass lesion. No CT evidence of acute infarction. No midline shift or mass effect. No hydrocephalus. Basilar cisterns are patent.  There is generalized cortical atrophy. Large remote lacune infarction in the LEFT basal ganglia. Periventricular white matter hypodensities. Findings are not changed from comparison MRI . Vascular: No hyperdense vessel or unexpected calcification. Skull: Normal. Negative for fracture or focal lesion. Sinuses/Orbits: Paranasal sinuses and mastoid air cells are clear. Orbits are clear. Other: None IMPRESSION: 1. No acute intracranial findings.  No change from MRI 10/02/2016 2. Atrophy and chronic white matter microvascular disease. Electronically Signed   By: Suzy Bouchard M.D.   On: 10/06/2016 10:28   Mr Jeri Cos [id number] Contrast  Result Date: 10/02/2016 CLINICAL DATA:  Transient aphasia, witnessed earlier today. History of chronic atrial fibrillation, convulsive like activity. EXAM: MRI HEAD WITHOUT AND WITH CONTRAST TECHNIQUE: Multiplanar, multiecho pulse sequences of the brain and surrounding structures were obtained without and with intravenous contrast. CONTRAST:  68mL MULTIHANCE GADOBENATE DIMEGLUMINE 529 MG/ML IV SOLN COMPARISON:  CT head 10/01/2016.  MR head 03/08/2016. FINDINGS: The patient was unable to remain motionless for the exam. Small or subtle lesions could be overlooked. Brain: No restricted diffusion to indicate acute infarction. Widespread areas of chronic infarction associated with atrophic change. Hydrocephalus ex vacuo. Chronic lacunar infarcts affect both the deep nuclei as well as the white matter. Focal and confluent T2 and FLAIR signal abnormality throughout the white matter consistent with chronic microvascular ischemic change. Widespread micro bleeds, seen on gradient sequence, suggests hypertensive cerebrovascular disease as etiology. Coronal imaging through the temporal lobes demonstrates no signs of mass, infection, or inflammation. Post infusion, no abnormal enhancement of the brain or meninges. Vascular: Normal flow voids. Skull and upper cervical spine: Normal marrow signal.  Sinuses/Orbits: Negative. Other: None. Compared with most recent prior, small deep white matter infarct noted that time has resolved. IMPRESSION: Atrophy and small vessel disease. 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 No acute intracranial findings. No underlying cause for epileptiform activity is observed on this motion degraded exam. Electronically Signed   By: Staci Righter M.D.   On: 10/02/2016 19:27   Ct Head Code Stroke W/o Cm  Result Date: 10/01/2016 CLINICAL DATA:  Code stroke. 83 year old male with slurred speech and witnessed seizure. Atrial fibrillation. Dementia. Prostate cancer. Initial encounter. EXAM: CT HEAD WITHOUT CONTRAST TECHNIQUE: Contiguous axial images were obtained from the base of the skull through the vertex without intravenous contrast. COMPARISON:  03/08/2016 head CT and brain MR. FINDINGS: Brain: No intracranial hemorrhage or CT evidence of large acute infarct. Evaluation of acute infarct limited by motion. Remote left basal ganglia/ corona radiata infarct. Chronic microvascular changes. Global atrophy. Ventricular prominence unchanged and probably atrophy rather hydrocephalus. Vascular: Vascular calcifications. Middle cerebral artery branch vessels slightly dense but without change since prior exam. Skull: No acute abnormality. Sinuses/Orbits: Post left lens replacement. Minimal exophthalmos. Visualized sinuses clear. Other: Negative. ASPECTS [facility] Stroke Program Early CT Score) - Ganglionic level infarction (caudate, lentiform nuclei, internal capsule, insula, M1-M3 cortex): 7 - Supraganglionic infarction (M4-M6 cortex): 3 Total score (0-10 with 10 being normal): 10 IMPRESSION: 1. No intracranial hemorrhage or CT evidence of large acute infarct. Evaluation limited by motion. Remote left basal ganglia/corona radiata infarct. Chronic microvascular changes. Global atrophy. 2. ASPECTS is 10 These results were called by telephone at the time of interpretation on  10/01/2016 at 9:15 pm to Dr. Elnora Morrison , who verbally acknowledged these results. Electronically Signed   By: Genia Del M.D.   On: 10/01/2016 21:16    Time Spent in minutes  25   Louellen Molder M.D on 10/09/2016 at 4:48 PM  Between 7am to 7pm - Pager - [phone]  After 7pm go to [email or web address] - password [facility]  [facility] -  Office  [phone]

## 2016-10-09 NOTE — Progress Notes (Signed)
Physical Therapy Treatment Patient Details Name: Dustin [name] MRN: [id number] DOB: [birth year] Today's Date: 10/09/2016    History of Present Illness 83 y.o.gentleman with a history of multiple prior strokes, residual right sided weakness at baseline, HTN, HLD, DM, dementia, prostate cancer, and chronic atrial fibrillation who presents to the ED via EMS for evaluation of transient aphasia, witnessed by his wife during dinner, followed by reported convulsions concerning for new onset seizure activity.     PT Comments    Patient previously evaluated by PT with SNF recommendation.  Noted plans for transition to comfort, but reports his family is in the hospital (unsure of accuracy.)  Will need SNF level rehab if plans to return home.  Will continue skilled PT during acute stay.   Follow Up Recommendations  SNF     Equipment Recommendations  Other (comment) (TBA)    Recommendations for Other Services       Precautions / Restrictions Precautions Precautions: Fall Restrictions Weight Bearing Restrictions: No    Mobility  Bed Mobility Overal bed mobility: Needs Assistance Bed Mobility: Sidelying to Sit;Sit to Supine   Sidelying to sit: Max assist   Sit to supine: Mod assist   General bed mobility comments: assist to bring legs off bed and pt using rail, but needed help to lift trunk; assist for legs in bed to supine, pt able to roll over to L side for comfort positioning and pillows applied.  Transfers Overall transfer level: Needs assistance Equipment used: Rolling walker (2 wheeled) Transfers: Sit to/from Stand Sit to Stand: Max assist         General transfer comment: stood from bed with heavy lifting help pt pulling up on walker, returned to sitting due to unable to take steps with walker  Ambulation/Gait             General Gait Details: unable to take steps up to [facility] with RW and assist for weight shift   Stairs            Wheelchair Mobility     Modified Rankin (Stroke Patients Only)       Balance Overall balance assessment: Needs assistance Sitting-balance support: Feet supported;Bilateral upper extremity supported Sitting balance-Leahy Scale: Fair Sitting balance - Comments: initially sitting with flexed posture, able to sit with S, but with assist able to correct posture in sitting for more upright position of head and trunk   Standing balance support: Bilateral upper extremity supported Standing balance-Leahy Scale: Poor Standing balance comment: mod support in standing with walker                     Cognition Arousal/Alertness: Awake/alert Behavior During Therapy: Flat affect Overall Cognitive Status: History of cognitive impairments - at baseline                      Exercises      General Comments        Pertinent Vitals/Pain Faces Pain Scale: Hurts whole lot Pain Location: R flank Pain Descriptors / Indicators: Aching Pain Intervention(s): Monitored during session;Limited activity within patient's tolerance;Repositioned;Patient requesting pain meds-RN notified    Home Living                      Prior Function            PT Goals (current goals can now be found in the care plan section) Progress towards PT goals: Not progressing toward goals - comment (  functional decline from last session, but had some medical decline with SDU stay)    Frequency    Min 3X/week      PT Plan Current plan remains appropriate    Co-evaluation             End of Session Equipment Utilized During Treatment: Gait belt Activity Tolerance: Patient limited by fatigue Patient left: in bed;with call bell/phone within reach;with bed alarm set     Time: [id number] PT Time Calculation (min) (ACUTE ONLY): 18 min  Charges:  $Therapeutic Activity: 8-22 mins                    G Codes:      Reginia Naas 2016-11-07, 12:11 PM  Magda Kiel, [place] 2016-11-07

## 2016-10-09 NOTE — Progress Notes (Signed)
Daily Progress Note   Patient Name: Dustin [name]       Date: 10/09/2016 DOB: 1927-09-09  Age: 83 y.o. MRN#: [id number] Attending Physician: Louellen Molder, MD Primary Care Physician: Osborne Casco, MD Admit Date: 10/01/2016  Reason for Consultation/Follow-up: Establishing goals of care, Hospice Evaluation, Non pain symptom management, Pain control and Psychosocial/spiritual support  Subjective:  -re-meet with family today to clarify decisions related to disposition and EOL decisions and options   -focus is comfort and dignity  -however patient has been more interactive, increased POs and family is now considering SNF for rehab.  [facility] is first choice  - MOST form completed and reflects full comfort   Questions and concerns addressed.   Family encouraged to call with questions or concerns.  PMT will continue to support holistically.  Recommend PCS to follow on discaharge   Length of Stay: 6  Current Medications: Scheduled Meds:  . bicalutamide  50 mg Oral Daily  . brimonidine  1 drop Both Eyes BID  . lacosamide  100 mg Oral BID  . mouth rinse  15 mL Mouth Rinse BID  . morphine CONCENTRATE  5 mg Oral Q6H  . QUEtiapine  25 mg Oral QHS    Continuous Infusions:   PRN Meds: acetaminophen **OR** acetaminophen (TYLENOL) oral liquid 160 mg/5 mL **OR** acetaminophen, hydrALAZINE, morphine CONCENTRATE, polyethylene glycol  Physical Exam  Constitutional: He appears lethargic. He appears ill.  -frail. Elderly, intermittently confused to place and situation  HENT:  Mouth/Throat: Oropharynx is clear and moist.  Cardiovascular: Normal rate, regular rhythm and normal heart sounds.   Pulmonary/Chest: He has decreased breath sounds in the right lower field and the left  lower field.  Neurological: He appears lethargic.  Skin: Skin is warm and dry.            Vital Signs: BP (!) 171/87 (BP Location: Right Arm)   Pulse 68   Temp 98.4 F (36.9 C) (Oral)   Resp 18   Ht 5\' 10"  (1.778 m)   Wt 83.7 kg (184 lb 8.4 oz)   SpO2 94%   BMI 26.48 kg/m  SpO2: SpO2: 94 % O2 Device: O2 Device: Not Delivered O2 Flow Rate: O2 Flow Rate (L/min): 2 L/min  Intake/output summary:  Intake/Output Summary (Last 24 hours) at 10/09/16 0830 Last data filed at  10/08/16 1700  Gross per 24 hour  Intake                0 ml  Output                0 ml  Net                0 ml   LBM: Last BM Date: 10/07/16 Baseline Weight: Weight: 82.1 kg (181 lb) Most recent weight: Weight: 83.7 kg (184 lb 8.4 oz)       Palliative Assessment/Data: 30% at best    Flowsheet Rows   Flowsheet Row Most Recent Value  Intake Tab  Referral Department  Hospitalist  Palliative Care Primary Diagnosis  Neurology  Date Notified  10/07/16  Palliative Care Type  New Palliative care  Reason for referral  Clarify Goals of Care  Date of Admission  10/01/16  Date first seen by Palliative Care  10/08/16  # of days Palliative referral response time  1 Day(s)  # of days IP prior to Palliative referral  6  Clinical Assessment  Palliative Performance Scale Score  20%  Psychosocial & Spiritual Assessment  Palliative Care Outcomes      Patient Active Problem List   Diagnosis Date Noted  . Palliative care by specialist   . Encephalopathy, hypertensive 10/05/2016  . Sinus bradycardia 10/05/2016  . Pressure injury of skin 10/05/2016  . Wide-complex tachycardia ([place])   . Sick sinus syndrome ([place])   . Seizure ([place]) 10/01/2016  . Acute encephalopathy 10/01/2016  . History of CVA (cerebrovascular accident)   . PAF (paroxysmal atrial fibrillation) ([place])   . Normocytic anemia   . Cerebral infarction due to unspecified mechanism   . Acute CVA (cerebrovascular accident) ([place]) 03/09/2016  . Physical  debility 03/09/2016  . Essential hypertension 03/09/2016  . Diabetes mellitus type 2, noninsulin dependent ([place]) 03/09/2016  . Chronic atrial fibrillation ([place]) 03/09/2016  . DNR (do not resuscitate) 03/09/2016  . Falls 03/09/2016  . Right leg weakness 03/09/2016  . Pancreatic mass 01/25/2016  . Dementia with behavioral disturbance   . Fall 02/15/2015  . Multiple fractures of ribs of left side 02/15/2015  . Traumatic pneumothorax 02/14/2015    Palliative Care Assessment & Plan   Patient Profile: 83 y.o. male   admitted on 10/01/2016 with a history of multiple prior strokes, residual right sided weakness at baseline, HTN, HLD, DM, dementia, prostate cancer, and chronic atrial fibrillation (CHADS-Vasc score of 6, anticoagulated with Pradaxa) who presents to the ED via EMS for evaluation of transient aphasia, witnessed by his wife during dinner, followed by reported convulsions concerning for new onset seizure activity.   Per family patient has had continued physical, functional and cognitive decline.  Family faces advanced directive deicsions and anticipatory care needs.  - decision yesterday to shift to full comfort path   Recommendations/Plan:  Comfort remains the focus of care  SNF for rehabilitation with PCS to follow  If patient decompensate, family's ultimate goal is a hospice facility at EOL  Goals of Care and Additional Recommendations:  Limitations on Scope of Treatment: Avoid Hospitalization, Minimize Medications, Initiate Comfort Feeding, No Artificial Feeding, No Diagnostics, No IV Antibiotics and No Lab Draws  Code Status:    Code Status Orders        Start     Ordered   10/02/16 0017  Do not attempt resuscitation (DNR)  Continuous    Question Answer Comment  In the event of cardiac or  respiratory ARREST Do not call a "code blue"   In the event of cardiac or respiratory ARREST Do not perform Intubation, CPR, defibrillation or ACLS   In the event of cardiac  or respiratory ARREST Use medication by any route, position, wound care, and other measures to relive pain and suffering. May use oxygen, suction and manual treatment of airway obstruction as needed for comfort.      10/02/16 0016    Code Status History    Date Active Date Inactive Code Status Order ID Comments User Context   03/09/2016  2:46 AM 03/13/2016  8:54 PM DNR [id number]  Roney Jaffe, MD Inpatient   02/14/2015 10:47 AM 02/18/2015  6:44 PM Full Code [id number]  Earnstine Regal, PA-C ED       Prognosis:   < 3 months  Discharge Planning:  [facility] for rehab with Palliative care service follow-up  Care plan was discussed with Dr Clementeen Graham  Thank you for allowing the Palliative Medicine Team to assist in the care of this patient.   Time In: 1500 Time Out: 1600 Total Time 60 min Prolonged Time Billed  no       Greater than 50%  of this time was spent counseling and coordinating care related to the above assessment and plan.  Wadie Lessen, NP  Please contact Palliative Medicine Team phone at [phone] for questions and concerns.

## 2016-10-09 NOTE — Care Management Important Message (Signed)
Important Message  Patient Details  Name: Dustin [name] MRN: [id number] Date of Birth: [birth year]   Medicare Important Message Given:  Yes    Nathen May 10/09/2016, 2:49 PM

## 2016-10-09 NOTE — Progress Notes (Addendum)
3:30pm Pt dtr called CSW to inform of decision for SNF.  CSW made sure pt dtr aware this would be short term rehab.  Facility working on authorization- no auth at this time- anticipate receiving [employer].  1:30pm CSW following for possible SNF vs residential hospice placement  PT saw pt again today- CSW informed facility who will restart auth today if pt is appropriate for SNF- facility would only be able to accept patient with intention of being short term rehab patient- insurance will not pay for comfort care measures and family would have to pay out of pocket if patient will be custodial at facility  Palliative meeting for 3pm today to discuss plan with family- CSW will await disposition decision from this meeting  Jorge Ny, [place] Social Worker [phone]

## 2016-10-10 DIAGNOSIS — F0151 Vascular dementia with behavioral disturbance: Secondary | ICD-10-CM

## 2016-10-10 DIAGNOSIS — R410 Disorientation, unspecified: Secondary | ICD-10-CM

## 2016-10-10 DIAGNOSIS — R627 Adult failure to thrive: Secondary | ICD-10-CM

## 2016-10-10 MED ORDER — MORPHINE SULFATE (CONCENTRATE) 10 MG/0.5ML PO SOLN: 5.0000 mg | Freq: Four times a day (QID) | ORAL | 0 refills | Status: AC

## 2016-10-10 MED ORDER — POLYETHYLENE GLYCOL 3350 17 G PO PACK: 17.0000 g | PACK | Freq: Every day | ORAL | 0 refills | Status: AC | PRN

## 2016-10-10 MED ORDER — QUETIAPINE FUMARATE 25 MG PO TABS: 25.0000 mg | ORAL_TABLET | Freq: Every day | ORAL | 0 refills | Status: DC

## 2016-10-10 NOTE — Progress Notes (Signed)
Patient was discharged to nursing home ([facility]) by MD order; discharged instructions review and sent to facility with care notes and prescriptions; IV DIC; facility was called for report but the nurse wasn't available for report. RN gave the phone number to secretary for the nurse who will receive the patient to call back the hospital for report; patient will be transported to facility via [facility].

## 2016-10-10 NOTE — Discharge Summary (Addendum)
Physician Discharge Summary  Dustin [name] [id number] DOB: 04-14-1927 DOA: 10/01/2016  PCP: Osborne Casco, MD  Admit date: 10/01/2016 Discharge date: 10/10/2016  Admitted From: home Disposition: SNF  Recommendations for Outpatient Follow-up:  1. Follow up with MD at SNF 2. Recommend palliative care follow up at the facility.    Equipment/Devices: Per therapy at the facility   Discharge Condition: Guarded CODE STATUS: DO NOT RESUSCITATE Diet recommendation: Heart Healthy     Discharge Diagnoses:  Principal Problem:   Seizure (Dustin [name])   Active Problems:   Encephalopathy, hypertensive   Dementia with behavioral disturbance   Essential hypertension   Diabetes mellitus type 2, noninsulin dependent (HCC)   Chronic atrial fibrillation (HCC)   History of CVA (cerebrovascular accident)   Sinus bradycardia   Pressure injury of skin   Wide-complex tachycardia (HCC)   Sick sinus syndrome ([name])   Palliative care by specialist   Adult failure to thrive syndrome   Acute delirium   Brief narrative/history of present illness  83 year old male with multiple prior strokes with residual right-sided weakness, hypertension, hyperlipidemia, diabetes mellitus dementia history of prostate cancer, chronic A. fib on Pradaxa brought to the ED by EMS for transient aphasia while at dinner followed by a witnessed convulsion. Patient started on anticonvulsant. Hospital course prolonged with bradycardia, episodes of hypotension and ongoing confusion.Dustin [name] He was planned for discharge to SNF on 12/29 but developed persistent bradycardia and became hypotensive and somnolent. Transfer to stepdown unit and cardiology consulted.  Hospital course  Principal Problem: Acute metabolic / hypertensive encephalopathy with? New onset seizures -Head CT unremarkable for acute findings. MRI brain shows chronic small vessel disease with diffuse cortical atrophy and findings suggestive of long-standing  hypertensive encephalopathy., no focal epileptogenic lesion. -EEG with generalized slowing without acute seizure activity. No signsOf seizures. -Neurology consult appreciated. Was started on Trileptal for seizure prophylaxis,switched to Vimpat given sinus bradycardia. Outpatient referral to neurology made.  -Repeat head CT on 12/30 due to increased confusion was negative for acute findings.  Added bedtime Seroquel for agitation and sundowning. Has been stable without agitation.  -PT recommendedSNF.   palliative care consulted who d/w family. They are inclined towards comfort measures and improved quality of life. Medications were minimized and patient taken off telemetry. MOST form was filled. Added liquid morphine for pain symptoms or dyspnea. Discontinued anticoagulation. Family of the only limitations on scope of treatment including avoiding hospitalization, minimizing medications, initiate comfort feeds if needed, no artificial feeding, no diagnostic test, IV antibiotics or lab draws.  Patient will be discharged to skilled nursing facility with palliative care follow-up. Family understand patient's guarded prognosis.    Active Problems: Sinus bradycardia with hypotension Hospital course complicated with frequent bradycardia suspicious of sick sinus syndrome. Also has episodes of wide-complex tachycardia. Not on any rate controlling agents and Trileptal switched to Vimpat. Replenished low potassium and magnesium. -Given his age and moderate to severe dementia and is not a candidate for pacemaker. -Heart rate has been stable.  Acute kidney injury Mild. improved   Bright red blood per rectum Hemoccult positive. H&H stable. D/c Pradaxa.  Elevated troponin Patient complained of chest pain on 12/29 .EKG unremarkable.  Troponin peaked at 0.16. No further intervention. Cannot use rate limiting agents.   History of multiple CVA Has residual weakness and vascular  dementia. Continue statin.  Chronic A. fib Had rectal pain on 12/31. Since going is for palliation now we have discontinued Pradaxa.  Hypokalemia Replenished  Type 2 diabetes mellitus Stable on SSI.  Resume metformin.  History prostate cancer Continue Casodex  Moderate to severe dementia Discontinue Zoloft and added bedtime Seroquel for sundowning.  Sacral pressure injury, stage 1 Silicone foam dressing and low air mattress per wound care   Family Communication :Daughter updated on the phone.  Disposition Plan:SNF with palliative care follow-up  Consults : Neurology Cardiology Palliative care  Procedures :  MRI brain EEG Head CT    Discharge Instructions  Discharge Instructions    Ambulatory referral to Neurology    Complete by:  As directed    An appointment is requested in approximately: 4 weeks     Allergies as of 10/10/2016   No Known Allergies     Medication List    STOP taking these medications   dabigatran 150 MG Caps capsule Commonly known as:  PRADAXA   sertraline 25 MG tablet Commonly known as:  ZOLOFT   valsartan-hydrochlorothiazide 80-12.5 MG tablet Commonly known as:  DIOVAN-HCT     TAKE these medications   bicalutamide 50 MG tablet Commonly known as:  CASODEX Take 50 mg by mouth daily.   brimonidine 0.1 % Soln Commonly known as:  ALPHAGAN P Place 1 drop into both eyes 2 (two) times daily.   Lacosamide 100 MG Tabs Take 1 tablet (100 mg total) by mouth 2 (two) times daily.   lovastatin 40 MG tablet Commonly known as:  MEVACOR Take 40 mg by mouth daily.   metFORMIN 500 MG tablet Commonly known as:  GLUCOPHAGE Take 500 mg by mouth daily.   morphine CONCENTRATE 10 MG/0.5ML Soln concentrated solution Take 0.25 mLs (5 mg total) by mouth every 6 (six) hours.   omeprazole 20 MG capsule Commonly known as:  PRILOSEC Take 20 mg by mouth daily.   polyethylene glycol packet Commonly known as:  MIRALAX /  GLYCOLAX Take 17 g by mouth daily as needed for mild constipation.   QUEtiapine 25 MG tablet Commonly known as:  SEROQUEL Take 1 tablet (25 mg total) by mouth at bedtime.       Contact information for follow-up providers    MD at SNF in 1 week Follow up.        palliaitive care follow up at the SNF Follow up.            Contact information for after-discharge care    Destination    [facility] Follow up.   Specialty:  [place] information: [place] [phone]                 No Known Allergies   Procedures/Studies: Ct Head Wo Contrast  Result Date: 10/06/2016 CLINICAL DATA:  Ct head wo, pt confused, NOTE: 83 y/o demented male, SNF pt, with a history of CAF on Pradaxa, HTN, DM, and CVA June 2016. Echo then showed normal LVF, mild LAE. He was admitted 10/01/16 after he had an apparent "seizure" and collapsed at the table EXAM: CT HEAD WITHOUT CONTRAST TECHNIQUE: Contiguous axial images were obtained from the base of the skull through the vertex without intravenous contrast. COMPARISON:  MRI 10/02/2016 FINDINGS: Brain: No acute intracranial hemorrhage. No focal mass lesion. No CT evidence of acute infarction. No midline shift or mass effect. No hydrocephalus. Basilar cisterns are patent. There is generalized cortical atrophy. Large remote lacune infarction in the LEFT basal ganglia. Periventricular white matter hypodensities. Findings are not changed from comparison MRI . Vascular: No hyperdense vessel or unexpected calcification. Skull: Normal. Negative  for fracture or focal lesion. Sinuses/Orbits: Paranasal sinuses and mastoid air cells are clear. Orbits are clear. Other: None IMPRESSION: 1. No acute intracranial findings.  No change from MRI 10/02/2016 2. Atrophy and chronic white matter microvascular disease. Electronically Signed   By: Suzy Bouchard M.D.   On: 10/06/2016 10:28    Mr Dustin [name] [id number] Contrast  Result Date: 10/02/2016 CLINICAL DATA:  Transient aphasia, witnessed earlier today. History of chronic atrial fibrillation, convulsive like activity. EXAM: MRI HEAD WITHOUT AND WITH CONTRAST TECHNIQUE: Multiplanar, multiecho pulse sequences of the brain and surrounding structures were obtained without and with intravenous contrast. CONTRAST:  72mL MULTIHANCE GADOBENATE DIMEGLUMINE 529 MG/ML IV SOLN COMPARISON:  CT head 10/01/2016.  MR head 03/08/2016. FINDINGS: The patient was unable to remain motionless for the exam. Small or subtle lesions could be overlooked. Brain: No restricted diffusion to indicate acute infarction. Widespread areas of chronic infarction associated with atrophic change. Hydrocephalus ex vacuo. Chronic lacunar infarcts affect both the deep nuclei as well as the white matter. Focal and confluent T2 and FLAIR signal abnormality throughout the white matter consistent with chronic microvascular ischemic change. Widespread micro bleeds, seen on gradient sequence, suggests hypertensive cerebrovascular disease as etiology. Coronal imaging through the temporal lobes demonstrates no signs of mass, infection, or inflammation. Post infusion, no abnormal enhancement of the brain or meninges. Vascular: Normal flow voids. Skull and upper cervical spine: Normal marrow signal. Sinuses/Orbits: Negative. Other: None. Compared with most recent prior, small deep white matter infarct noted that time has resolved. IMPRESSION: Atrophy and small vessel disease. 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 No acute intracranial findings. No underlying cause for epileptiform activity is observed on this motion degraded exam. Electronically Signed   By: Staci Righter M.D.   On: 10/02/2016 19:27   Ct Head Code Stroke W/o Cm  Result Date: 10/01/2016 CLINICAL DATA:  Code stroke. 83 year old male with slurred speech and witnessed seizure. Atrial fibrillation. Dementia. Prostate  cancer. Initial encounter. EXAM: CT HEAD WITHOUT CONTRAST TECHNIQUE: Contiguous axial images were obtained from the base of the skull through the vertex without intravenous contrast. COMPARISON:  03/08/2016 head CT and brain MR. FINDINGS: Brain: No intracranial hemorrhage or CT evidence of large acute infarct. Evaluation of acute infarct limited by motion. Remote left basal ganglia/ corona radiata infarct. Chronic microvascular changes. Global atrophy. Ventricular prominence unchanged and probably atrophy rather hydrocephalus. Vascular: Vascular calcifications. Middle cerebral artery branch vessels slightly dense but without change since prior exam. Skull: No acute abnormality. Sinuses/Orbits: Post left lens replacement. Minimal exophthalmos. Visualized sinuses clear. Other: Negative. ASPECTS [facility] Stroke Program Early CT Score) - Ganglionic level infarction (caudate, lentiform nuclei, internal capsule, insula, M1-M3 cortex): 7 - Supraganglionic infarction (M4-M6 cortex): 3 Total score (0-10 with 10 being normal): 10 IMPRESSION: 1. No intracranial hemorrhage or CT evidence of large acute infarct. Evaluation limited by motion. Remote left basal ganglia/corona radiata infarct. Chronic microvascular changes. Global atrophy. 2. ASPECTS is 10 These results were called by telephone at the time of interpretation on 10/01/2016 at 9:15 pm to Dr. Elnora Morrison , who verbally acknowledged these results. Electronically Signed   By: Genia Del M.D.   On: 10/01/2016 21:16     Subjective: Has been calm and eating his meals slowly  Discharge Exam: Vitals:   10/09/16 2210 10/10/16 0539  BP: 104/66 (!) 158/67  Pulse: 91 73  Resp: 16 18  Temp: 98.1 F (36.7 C) 98.6 F (37 C)   Vitals:   10/09/16 0631 10/09/16 1305 10/09/16  2210 10/10/16 0539  BP: (!) 171/87 (!) 113/52 104/66 (!) 158/67  Pulse: 68 (!) 53 91 73  Resp: 18 18 16 18   Temp: 98.4 F (36.9 C) 98.3 F (36.8 C) 98.1 F (36.7 C) 98.6 F (37 C)   TempSrc: Oral Oral Oral Oral  SpO2: 94% 99% 97% 96%  Weight:      Height:        Gen: confused but more alert and communicative HEENT:moist mucosa, supple neck Chest: clear b/l, no added sounds CVS: S1 and S2 irregular, no murmurs,  GI: soft, NT, ND, Musculoskeletal: warm, no edema CNS: AAOX1   The results of significant diagnostics from this hospitalization (including imaging, microbiology, ancillary and laboratory) are listed below for reference.     Microbiology: Recent Results (from the past 240 hour(s))  MRSA PCR Screening     Status: None   Collection Time: 10/05/16  2:49 PM  Result Value Ref Range Status   MRSA by PCR NEGATIVE NEGATIVE Final    Comment:        The GeneXpert MRSA Assay (FDA approved for NASAL specimens only), is one component of a comprehensive MRSA colonization surveillance program. It is not intended to diagnose MRSA infection nor to guide or monitor treatment for MRSA infections.      Labs: BNP (last 3 results) No results for input(s): BNP in the last 8760 hours. Basic Metabolic Panel:  Recent Labs Lab 10/05/16 1142 10/07/16 0404  NA 141 139  K 3.5 3.5  CL 106 105  CO2 26 25  GLUCOSE 134* 176*  BUN 15 23*  CREATININE 1.25* 1.21  CALCIUM 9.1 9.2  MG 1.8 2.1   Liver Function Tests: No results for input(s): AST, ALT, ALKPHOS, BILITOT, PROT, ALBUMIN in the last 168 hours. No results for input(s): LIPASE, AMYLASE in the last 168 hours. No results for input(s): AMMONIA in the last 168 hours. CBC:  Recent Labs Lab 10/07/16 1907  HGB 13.9  HCT 41.7   Cardiac Enzymes:  Recent Labs Lab 10/05/16 1505 10/05/16 2008 10/06/16 0219  TROPONINI 0.16* 0.13* 0.14*   BNP: Invalid input(s): POCBNP CBG:  Recent Labs Lab 10/07/16 1141 10/07/16 1726 10/07/16 2110 10/08/16 0750 10/08/16 1316  GLUCAP 178* 119* 192* 127* 127*   D-Dimer No results for input(s): DDIMER in the last 72 hours. Hgb A1c No results for input(s):  HGBA1C in the last 72 hours. Lipid Profile No results for input(s): CHOL, HDL, LDLCALC, TRIG, CHOLHDL, LDLDIRECT in the last 72 hours. Thyroid function studies No results for input(s): TSH, T4TOTAL, T3FREE, THYROIDAB in the last 72 hours.  Invalid input(s): FREET3 Anemia work up No results for input(s): VITAMINB12, FOLATE, FERRITIN, TIBC, IRON, RETICCTPCT in the last 72 hours. Urinalysis    Component Value Date/Time   COLORURINE YELLOW 10/01/2016 2321   APPEARANCEUR CLEAR 10/01/2016 2321   LABSPEC 1.017 10/01/2016 2321   PHURINE 5.0 10/01/2016 2321   GLUCOSEU 50 (A) 10/01/2016 2321   HGBUR SMALL (A) 10/01/2016 2321   BILIRUBINUR NEGATIVE 10/01/2016 2321   KETONESUR NEGATIVE 10/01/2016 2321   PROTEINUR 100 (A) 10/01/2016 2321   UROBILINOGEN 2.0 (H) 02/16/2015 1143   NITRITE NEGATIVE 10/01/2016 2321   LEUKOCYTESUR NEGATIVE 10/01/2016 2321   Sepsis Labs Invalid input(s): PROCALCITONIN,  WBC,  LACTICIDVEN Microbiology Recent Results (from the past 240 hour(s))  MRSA PCR Screening     Status: None   Collection Time: 10/05/16  2:49 PM  Result Value Ref Range Status   MRSA by PCR NEGATIVE  NEGATIVE Final    Comment:        The GeneXpert MRSA Assay (FDA approved for NASAL specimens only), is one component of a comprehensive MRSA colonization surveillance program. It is not intended to diagnose MRSA infection nor to guide or monitor treatment for MRSA infections.      Time coordinating discharge: Over 30 minutes  SIGNED:   Louellen Molder, MD  [facility] 10/10/2016, 11:58 AM Pager   If 7PM-7AM, please contact night-coverage [email or web address] Password [email or web address]

## 2016-10-10 NOTE — Progress Notes (Signed)
Facility received insurance auth at 3:30pm  Patient will discharge to [facility] Anticipated discharge date: 1/3 Family notified: Warden/ranger by [employer]- called at 3:35pm Report #: [id number]  Joshua signing off.  Jorge Ny, LCSW Clinical Social Worker [phone]

## 2016-10-10 NOTE — Progress Notes (Signed)
RN gave report to the nurse who will received the patient at [facility] facility.

## 2016-10-10 NOTE — Clinical Social Work Placement (Signed)
   CLINICAL SOCIAL WORK PLACEMENT  NOTE  Date:  10/10/2016  Patient Details  Name: Dustin [name] MRN: [id number] Date of Birth: 1927-02-17  Clinical Social Work is seeking post-discharge placement for this patient at the [place] level of care (*CSW will initial, date and re-position this form in  chart as items are completed):  Yes   Patient/family provided with [facility] Work Department's list of facilities offering this level of care within the geographic area requested by the patient (or if unable, by the patient's family).  Yes   Patient/family informed of their freedom to choose among providers that offer the needed level of care, that participate in Medicare, Medicaid or managed care program needed by the patient, have an available bed and are willing to accept the patient.  Yes   Patient/family informed of [facility]'s ownership interest in [facility] and [facility], as well as of the fact that they are under no obligation to receive care at these facilities.  PASRR submitted to EDS on       PASRR number received on       Existing PASRR number confirmed on 10/03/16     FL2 transmitted to all facilities in geographic area requested by pt/family on 10/03/16     FL2 transmitted to all facilities within larger geographic area on       Patient informed that his/her managed care company has contracts with or will negotiate with certain facilities, including the following:        Yes   Patient/family informed of bed offers received.  Patient chooses bed at [place], [place]     Physician recommends and patient chooses bed at      Patient to be transferred to [facility] on 10/06/16.  Patient to be transferred to facility by [facility]     Patient family notified on 10/06/16 of transfer.  Name of family member notified:  carla     PHYSICIAN Please sign FL2, Please sign DNR, Please prepare prescriptions, Please  prepare priority discharge summary, including medications     Additional Comment:    _______________________________________________ Jorge Ny, LCSW 10/10/2016, 3:38 PM

## 2016-10-15 ENCOUNTER — Encounter: Payer: Self-pay | Admitting: Physician Assistant

## 2016-11-03 ENCOUNTER — Encounter (HOSPITAL_COMMUNITY): Payer: Self-pay | Admitting: Emergency Medicine

## 2016-11-03 ENCOUNTER — Emergency Department (HOSPITAL_COMMUNITY): Payer: Medicare PPO

## 2016-11-03 ENCOUNTER — Inpatient Hospital Stay (HOSPITAL_COMMUNITY)
Admission: EM | Admit: 2016-11-03 | Discharge: 2016-11-07 | DRG: 194 | Disposition: A | Discharge status: Hospice | Payer: Medicare PPO | Attending: Internal Medicine | Admitting: Internal Medicine

## 2016-11-03 DIAGNOSIS — E119 Type 2 diabetes mellitus without complications: Secondary | ICD-10-CM | POA: Diagnosis present

## 2016-11-03 DIAGNOSIS — F0151 Vascular dementia with behavioral disturbance: Secondary | ICD-10-CM | POA: Diagnosis not present

## 2016-11-03 DIAGNOSIS — J189 Pneumonia, unspecified organism: Secondary | ICD-10-CM | POA: Diagnosis present

## 2016-11-03 DIAGNOSIS — E785 Hyperlipidemia, unspecified: Secondary | ICD-10-CM | POA: Diagnosis present

## 2016-11-03 DIAGNOSIS — I69898 Other sequelae of other cerebrovascular disease: Secondary | ICD-10-CM

## 2016-11-03 DIAGNOSIS — Z79899 Other long term (current) drug therapy: Secondary | ICD-10-CM | POA: Diagnosis not present

## 2016-11-03 DIAGNOSIS — Y95 Nosocomial condition: Secondary | ICD-10-CM | POA: Diagnosis present

## 2016-11-03 DIAGNOSIS — R05 Cough: Secondary | ICD-10-CM | POA: Diagnosis present

## 2016-11-03 DIAGNOSIS — Z79891 Long term (current) use of opiate analgesic: Secondary | ICD-10-CM

## 2016-11-03 DIAGNOSIS — Z6825 Body mass index (BMI) 25.0-25.9, adult: Secondary | ICD-10-CM

## 2016-11-03 DIAGNOSIS — I119 Hypertensive heart disease without heart failure: Secondary | ICD-10-CM | POA: Diagnosis present

## 2016-11-03 DIAGNOSIS — Z7984 Long term (current) use of oral hypoglycemic drugs: Secondary | ICD-10-CM | POA: Diagnosis not present

## 2016-11-03 DIAGNOSIS — Z8673 Personal history of transient ischemic attack (TIA), and cerebral infarction without residual deficits: Secondary | ICD-10-CM

## 2016-11-03 DIAGNOSIS — D649 Anemia, unspecified: Secondary | ICD-10-CM | POA: Diagnosis present

## 2016-11-03 DIAGNOSIS — R627 Adult failure to thrive: Secondary | ICD-10-CM | POA: Diagnosis present

## 2016-11-03 DIAGNOSIS — C61 Malignant neoplasm of prostate: Secondary | ICD-10-CM | POA: Diagnosis present

## 2016-11-03 DIAGNOSIS — I10 Essential (primary) hypertension: Secondary | ICD-10-CM

## 2016-11-03 DIAGNOSIS — I480 Paroxysmal atrial fibrillation: Secondary | ICD-10-CM | POA: Diagnosis present

## 2016-11-03 DIAGNOSIS — F03918 Unspecified dementia, unspecified severity, with other behavioral disturbance: Secondary | ICD-10-CM | POA: Diagnosis present

## 2016-11-03 DIAGNOSIS — I482 Chronic atrial fibrillation, unspecified: Secondary | ICD-10-CM | POA: Diagnosis present

## 2016-11-03 DIAGNOSIS — Z66 Do not resuscitate: Secondary | ICD-10-CM | POA: Diagnosis present

## 2016-11-03 DIAGNOSIS — G40909 Epilepsy, unspecified, not intractable, without status epilepticus: Secondary | ICD-10-CM | POA: Diagnosis present

## 2016-11-03 DIAGNOSIS — E876 Hypokalemia: Secondary | ICD-10-CM | POA: Diagnosis present

## 2016-11-03 DIAGNOSIS — F0391 Unspecified dementia with behavioral disturbance: Secondary | ICD-10-CM | POA: Diagnosis present

## 2016-11-03 DIAGNOSIS — Z515 Encounter for palliative care: Secondary | ICD-10-CM | POA: Diagnosis present

## 2016-11-03 DIAGNOSIS — I69398 Other sequelae of cerebral infarction: Secondary | ICD-10-CM

## 2016-11-03 DIAGNOSIS — Z87891 Personal history of nicotine dependence: Secondary | ICD-10-CM | POA: Diagnosis not present

## 2016-11-03 LAB — CBC WITH DIFFERENTIAL/PLATELET
BASOS PCT: 0 %
Basophils Absolute: 0 10*3/uL (ref 0.0–0.1)
Eosinophils Absolute: 0.1 10*3/uL (ref 0.0–0.7)
Eosinophils Relative: 2 %
HCT: 37.8 % — ABNORMAL LOW (ref 39.0–52.0)
HEMOGLOBIN: 12.5 g/dL — AB (ref 13.0–17.0)
Lymphocytes Relative: 37 %
Lymphs Abs: 2.2 10*3/uL (ref 0.7–4.0)
MCH: 32.2 pg (ref 26.0–34.0)
MCHC: 33.1 g/dL (ref 30.0–36.0)
MCV: 97.4 fL (ref 78.0–100.0)
MONO ABS: 0.5 10*3/uL (ref 0.1–1.0)
MONOS PCT: 8 %
NEUTROS PCT: 53 %
Neutro Abs: 3.3 10*3/uL (ref 1.7–7.7)
Platelets: 213 10*3/uL (ref 150–400)
RBC: 3.88 MIL/uL — ABNORMAL LOW (ref 4.22–5.81)
RDW: 13.7 % (ref 11.5–15.5)
WBC: 6.1 10*3/uL (ref 4.0–10.5)

## 2016-11-03 LAB — BASIC METABOLIC PANEL
Anion gap: 10 (ref 5–15)
BUN: 9 mg/dL (ref 6–20)
CALCIUM: 9.2 mg/dL (ref 8.9–10.3)
CO2: 27 mmol/L (ref 22–32)
CREATININE: 0.97 mg/dL (ref 0.61–1.24)
Chloride: 104 mmol/L (ref 101–111)
Glucose, Bld: 103 mg/dL — ABNORMAL HIGH (ref 65–99)
Potassium: 3.1 mmol/L — ABNORMAL LOW (ref 3.5–5.1)
SODIUM: 141 mmol/L (ref 135–145)

## 2016-11-03 LAB — GLUCOSE, CAPILLARY: GLUCOSE-CAPILLARY: 148 mg/dL — AB (ref 65–99)

## 2016-11-03 MED ORDER — PRAVASTATIN SODIUM 40 MG PO TABS
40.0000 mg | ORAL_TABLET | Freq: Every day | ORAL | Status: DC
  Administered 2016-11-04 – 2016-11-05 (×2): 40 mg via ORAL
  Filled 2016-11-03 (×3): qty 1

## 2016-11-03 MED ORDER — ACETAMINOPHEN 325 MG PO TABS: 650.0000 mg | ORAL_TABLET | Freq: Four times a day (QID) | ORAL | Status: DC | PRN

## 2016-11-03 MED ORDER — ENOXAPARIN SODIUM 40 MG/0.4ML ~~LOC~~ SOLN
40.0000 mg | Freq: Every day | SUBCUTANEOUS | Status: DC
  Administered 2016-11-04 – 2016-11-06 (×4): 40 mg via SUBCUTANEOUS
  Filled 2016-11-03 (×4): qty 0.4

## 2016-11-03 MED ORDER — BRIMONIDINE TARTRATE 0.15 % OP SOLN
1.0000 [drp] | Freq: Two times a day (BID) | OPHTHALMIC | Status: DC
  Administered 2016-11-04 – 2016-11-07 (×6): 1 [drp] via OPHTHALMIC
  Filled 2016-11-03: qty 5

## 2016-11-03 MED ORDER — DEXTROSE 5 % IV SOLN
1.0000 g | Freq: Two times a day (BID) | INTRAVENOUS | Status: DC
  Administered 2016-11-04 – 2016-11-06 (×5): 1 g via INTRAVENOUS
  Filled 2016-11-03 (×7): qty 1

## 2016-11-03 MED ORDER — LACOSAMIDE 100 MG PO TABS: 100.0000 mg | ORAL_TABLET | Freq: Two times a day (BID) | ORAL | Status: DC

## 2016-11-03 MED ORDER — POLYETHYLENE GLYCOL 3350 17 G PO PACK: 17.0000 g | PACK | Freq: Every day | ORAL | Status: DC | PRN

## 2016-11-03 MED ORDER — DEXTROSE 5 % IV SOLN
1.0000 g | Freq: Once | INTRAVENOUS | Status: AC
  Administered 2016-11-03: 1 g via INTRAVENOUS
  Filled 2016-11-03: qty 1

## 2016-11-03 MED ORDER — BICALUTAMIDE 50 MG PO TABS
50.0000 mg | ORAL_TABLET | Freq: Every day | ORAL | Status: DC
  Administered 2016-11-04 – 2016-11-07 (×3): 50 mg via ORAL
  Filled 2016-11-03 (×4): qty 1

## 2016-11-03 MED ORDER — POTASSIUM CHLORIDE CRYS ER 20 MEQ PO TBCR
20.0000 meq | EXTENDED_RELEASE_TABLET | Freq: Once | ORAL | Status: AC
  Administered 2016-11-04: 20 meq via ORAL
  Filled 2016-11-03: qty 1

## 2016-11-03 MED ORDER — INSULIN ASPART 100 UNIT/ML ~~LOC~~ SOLN: 0.0000 [IU] | Freq: Every day | SUBCUTANEOUS | Status: DC

## 2016-11-03 MED ORDER — MAGNESIUM SULFATE 2 GM/50ML IV SOLN
2.0000 g | Freq: Once | INTRAVENOUS | Status: DC
  Filled 2016-11-03: qty 50

## 2016-11-03 MED ORDER — PANTOPRAZOLE SODIUM 40 MG PO TBEC
40.0000 mg | DELAYED_RELEASE_TABLET | Freq: Every day | ORAL | Status: DC
  Administered 2016-11-04 – 2016-11-07 (×3): 40 mg via ORAL
  Filled 2016-11-03 (×4): qty 1

## 2016-11-03 MED ORDER — POTASSIUM CHLORIDE IN NACL 40-0.9 MEQ/L-% IV SOLN
INTRAVENOUS | Status: DC
  Administered 2016-11-04: 90 mL/h via INTRAVENOUS
  Filled 2016-11-03: qty 1000

## 2016-11-03 MED ORDER — LACOSAMIDE 50 MG PO TABS
100.0000 mg | ORAL_TABLET | Freq: Two times a day (BID) | ORAL | Status: DC
  Administered 2016-11-04 – 2016-11-07 (×6): 100 mg via ORAL
  Filled 2016-11-03 (×8): qty 2

## 2016-11-03 MED ORDER — QUETIAPINE FUMARATE 25 MG PO TABS
25.0000 mg | ORAL_TABLET | Freq: Every day | ORAL | Status: DC
  Administered 2016-11-04 (×2): 25 mg via ORAL
  Filled 2016-11-03 (×2): qty 1

## 2016-11-03 MED ORDER — MORPHINE SULFATE (CONCENTRATE) 10 MG/0.5ML PO SOLN
5.0000 mg | Freq: Four times a day (QID) | ORAL | Status: DC
  Administered 2016-11-04 (×3): 5 mg via ORAL
  Filled 2016-11-03 (×3): qty 0.5

## 2016-11-03 MED ORDER — INSULIN ASPART 100 UNIT/ML ~~LOC~~ SOLN
0.0000 [IU] | Freq: Three times a day (TID) | SUBCUTANEOUS | Status: DC
  Administered 2016-11-05: 2 [IU] via SUBCUTANEOUS

## 2016-11-03 MED ORDER — OSELTAMIVIR PHOSPHATE 75 MG PO CAPS
75.0000 mg | ORAL_CAPSULE | Freq: Two times a day (BID) | ORAL | Status: DC
  Administered 2016-11-04: 75 mg via ORAL
  Filled 2016-11-03 (×2): qty 1

## 2016-11-03 MED ORDER — VANCOMYCIN HCL IN DEXTROSE 750-5 MG/150ML-% IV SOLN
750.0000 mg | Freq: Two times a day (BID) | INTRAVENOUS | Status: DC
  Administered 2016-11-03: 750 mg via INTRAVENOUS
  Filled 2016-11-03 (×2): qty 150

## 2016-11-03 NOTE — ED Provider Notes (Signed)
[facility] DEPT Provider Note   CSN: [id number] Arrival date & time: 11/03/16  1400     History   Chief Complaint Chief Complaint  Patient presents with  . Cough    HPI Dustin [name] is a 83 y.o. male.  HPI  With history of dementia presents from his nursing facility with family members. Assist with history of present illness, as the patient's history of dementia inhibits his ability to assist. Per report he has had a worsening cough, has received empiric therapy with Tamiflu, but has had worsening dyspnea, discomfort, decreased interactivity, anorexia. Level V caveat  Past Medical History:  Diagnosis Date  . Arthritis   . Bilateral knee pain   . Cancer ([place]) 2012   skin cancers  . Chronic atrial fibrillation (HCC)    a. CHA2DS2VASc = 6-->pradaxa;  b. 03/2016 Echo: EF 60-65%, no rwma, mildly dil LA.  [name] Dementia   . Diabetes mellitus without complication ([place])   . Hyperlipidemia   . Hypertensive heart disease   . Pancreatitis   . Poor balance   . Prostate cancer ([place])   . Stroke [facility])    a. 02/2016 Left lentiform nucleus/corona radiata infarcts no MRI.    Patient Active Problem List   Diagnosis Date Noted  . Acute delirium 10/10/2016  . Adult failure to thrive syndrome   . Palliative care by specialist   . Encephalopathy, hypertensive 10/05/2016  . Sinus bradycardia 10/05/2016  . Pressure injury of skin 10/05/2016  . Wide-complex tachycardia ([place])   . Sick sinus syndrome ([place])   . Seizure ([place]) 10/01/2016  . Acute encephalopathy 10/01/2016  . History of CVA (cerebrovascular accident)   . PAF (paroxysmal atrial fibrillation) ([place])   . Normocytic anemia   . Cerebral infarction due to unspecified mechanism   . Acute CVA (cerebrovascular accident) ([place]) 03/09/2016  . Physical debility 03/09/2016  . Essential hypertension 03/09/2016  . Diabetes mellitus type 2, noninsulin dependent ([place]) 03/09/2016  . Chronic atrial fibrillation ([place]) 03/09/2016  . DNR  (do not resuscitate) 03/09/2016  . Falls 03/09/2016  . Right leg weakness 03/09/2016  . Pancreatic mass 01/25/2016  . Dementia with behavioral disturbance   . Fall 02/15/2015  . Multiple fractures of ribs of left side 02/15/2015  . Traumatic pneumothorax 02/14/2015    Past Surgical History:  Procedure Laterality Date  . APPENDECTOMY    . CHOLECYSTECTOMY    . EYE SURGERY    . PANCREAS SURGERY         Home Medications    Prior to Admission medications   Medication Sig Start Date End Date Taking? Authorizing Provider  bicalutamide (CASODEX) 50 MG tablet Take 50 mg by mouth daily.    Historical Provider, MD  brimonidine (ALPHAGAN P) 0.1 % SOLN Place 1 drop into both eyes 2 (two) times daily.     Historical Provider, MD  lacosamide 100 MG TABS Take 1 tablet (100 mg total) by mouth 2 (two) times daily. 10/05/16   Nishant Dhungel, MD  lovastatin (MEVACOR) 40 MG tablet Take 40 mg by mouth daily.     Historical Provider, MD  metFORMIN (GLUCOPHAGE) 500 MG tablet Take 500 mg by mouth daily.    Historical Provider, MD  Morphine Sulfate (MORPHINE CONCENTRATE) 10 MG/0.5ML SOLN concentrated solution Take 0.25 mLs (5 mg total) by mouth every 6 (six) hours. 10/10/16   Nishant Dhungel, MD  omeprazole (PRILOSEC) 20 MG capsule Take 20 mg by mouth daily.    Historical Provider, MD  polyethylene glycol (  MIRALAX / GLYCOLAX) packet Take 17 g by mouth daily as needed for mild constipation. 10/10/16   Nishant Dhungel, MD  QUEtiapine (SEROQUEL) 25 MG tablet Take 1 tablet (25 mg total) by mouth at bedtime. 10/10/16   Louellen Molder, MD    Family History Family History  Problem Relation Age of Onset  . Stroke Father   . Colon cancer Father   . Diabetes Mellitus II Sister   . Heart attack Neg Hx   . Prostate cancer Neg Hx     Social History Social History  Substance Use Topics  . Smoking status: Former Research scientist (life sciences)  . Smokeless tobacco: Never Used  . Alcohol use 1.2 oz/week    1 Cans of beer, 1 Glasses of  wine per week     Comment: "a beer once in a while"     Allergies   Patient has no known allergies.   Review of Systems Review of Systems  Unable to perform ROS: Dementia     Physical Exam Updated Vital Signs BP 143/73   Pulse (!) 57   Temp 97.9 F (36.6 C) (Oral)   Resp 19   Ht 5\' 10"  (1.778 m)   Wt 181 lb (82.1 kg)   SpO2 96%   BMI 25.97 kg/m   Physical Exam  Constitutional:  Sickly appearing frail elderly male  HENT:  Head: Normocephalic and atraumatic.  Eyes: Conjunctivae and EOM are normal.  Cardiovascular: Normal rate and regular rhythm.   Pulmonary/Chest: No stridor.  Diminished breath sounds throughout, ongoing cough  Abdominal: He exhibits no distension.  Musculoskeletal: He exhibits no edema.  Neurological: He is alert. He displays atrophy.  Skin: Skin is warm and dry.  Psychiatric:  Withdrawn, slow speech  Nursing note and vitals reviewed.    ED Treatments / Results  Labs (all labs ordered are listed, but only abnormal results are displayed) Labs Reviewed  CBC WITH DIFFERENTIAL/PLATELET - Abnormal; Notable for the following:       Result Value   RBC 3.88 (*)    Hemoglobin 12.5 (*)    HCT 37.8 (*)    All other components within normal limits  BASIC METABOLIC PANEL - Abnormal; Notable for the following:    Potassium 3.1 (*)    Glucose, Bld 103 (*)    All other components within normal limits    Radiology Dg Chest 2 View  Result Date: 11/03/2016 CLINICAL DATA:  Cough EXAM: CHEST  2 VIEW COMPARISON:  03/08/2016 FINDINGS: Heart and mediastinal contours are within normal limits. Patchy airspace disease noted posteriorly at the lung bases on the lateral view, likely in the left base. No effusions. No acute bony abnormality. IMPRESSION: Patchy opacity posteriorly on the lateral view, likely in the left lower lobe, concerning for pneumonia. Electronically Signed   By: Rolm Baptise M.D.   On: 11/03/2016 14:38    Procedures Procedures (including  critical care time)  Medications Ordered in ED Medications  ceFEPIme (MAXIPIME) 1 g in dextrose 5 % 50 mL IVPB (not administered)     Initial Impression / Assessment and Plan / ED Course  I have reviewed the triage vital signs and the nursing notes.  Pertinent labs & imaging results that were available during my care of the patient were reviewed by me and considered in my medical decision making (see chart for details).   elderly male presents from his nursing facility with concern for worsening dyspnea, cough. Patient is unable pneumonia. Patient has recent hospitalization, and also recently  began empiric therapy with Tamiflu. Given the patient's status, we initiated broad-spectrum antibiotic, patient admitted for further evaluation and management.   Carmin Muskrat, MD 11/03/16 [phone]

## 2016-11-03 NOTE — Progress Notes (Signed)
Pharmacy Antibiotic Note  Dustin [name] is a 83 y.o. male admitted on 11/03/2016 with pneumonia  Plan: Cefepime 1 g q12h Vancomycin 750 mg q12h  Height: 5\' 10"  (177.8 cm) Weight: 181 lb (82.1 kg) IBW/kg (Calculated) : 73  Temp (24hrs), Avg:97.9 F (36.6 C), Min:97.9 F (36.6 C), Max:97.9 F (36.6 C)   Recent Labs Lab 11/03/16 1412  WBC 6.1  CREATININE 0.97    Estimated Creatinine Clearance: 55.4 mL/min (by C-G formula based on SCr of 0.97 mg/dL).    No Known Allergies  Levester Fresh, PharmD, BCPS, BCCCP Clinical Pharmacist 11/03/2016 6:45 PM

## 2016-11-03 NOTE — H&P (Signed)
History and Physical    Dustin [name] [id number] DOB: 1927-02-23 DOA: 11/03/2016  PCP: Dustin Casco, MD   Patient coming from: SNF   Chief Complaint: Cough worsening  HPI: Dustin [name] is a 83 y.o. male with medical history significant for paroxysmal atrial fibrillation recently taken off of anticoagulation, type 2 diabetes mellitus, hypertension, dementia with behavioral disturbance, prostate cancer, history of multiple CVAs, and seizure disorder, now presenting to the emergency department for evaluation of a worsening cough despite treatment with Tamiflu at his SNF. History is a little bit limited by the patient's dementia. Per the report of patient's family and nursing facility personnel, there has been a worsening cough and dyspnea over the past week. It is unclear whether he was tested for influenza, but had been started on Tamiflu. Despite this, his cough and dyspnea has continued to worsen. Cough has been productive of thick sputum. No fevers have been reported, but patient has been increasingly lethargic and with poor appetite. The patient denies chest pain or palpitations and there has been no swelling or tenderness in the lower extremities. He was hospitalized 1 month ago for new onset seizures and discharged on Vimpat. He had previously been on Pradaxa for A. fib, but developed bright red blood per rectum during the recent hospitalization, and given the patient's desire for more palliative approach to his care, the anticoagulant was discontinued.  ED Course: Upon arrival to the ED, patient is found to be afebrile, saturating adequately on room air, bradycardic in the high 50s, and mildly hypertensive. Chest x-ray is notable for patchy opacity in the left lower lobe concerning for pneumonia. Chemistry panel is notable for potassium of 3.1 and CBC features a stable normocytic anemia with hemoglobin 12.5. Patient was treated with empiric vancomycin and cefepime in the  emergency department. He remained hemodynamically stable, but continues to be dyspneic at rest and will be admitted to the medical-surgical unit for ongoing evaluation and management of pneumonia.  Review of Systems:  Unable to obtain complete ROS secondary to the patient's clinical condition with dementia.  Past Medical History:  Diagnosis Date  . Arthritis   . Bilateral knee pain   . Cancer ([place]) 2012   skin cancers  . Chronic atrial fibrillation (HCC)    a. CHA2DS2VASc = 6-->pradaxa;  b. 03/2016 Echo: EF 60-65%, no rwma, mildly dil LA.  [name] Dementia   . Diabetes mellitus without complication ([place])   . Hyperlipidemia   . Hypertensive heart disease   . Pancreatitis   . Poor balance   . Prostate cancer ([place])   . Stroke [facility])    a. 02/2016 Left lentiform nucleus/corona radiata infarcts no MRI.    Past Surgical History:  Procedure Laterality Date  . APPENDECTOMY    . CHOLECYSTECTOMY    . EYE SURGERY    . PANCREAS SURGERY       reports that he has quit smoking. He has never used smokeless tobacco. He reports that he drinks about 1.2 oz of alcohol per week . He reports that he does not use drugs.  No Known Allergies  Family History  Problem Relation Age of Onset  . Stroke Father   . Colon cancer Father   . Diabetes Mellitus II Sister   . Heart attack Neg Hx   . Prostate cancer Neg Hx      Prior to Admission medications   Medication Sig Start Date End Date Taking? Authorizing Provider  bicalutamide (CASODEX) 50 MG tablet Take 50 mg by  mouth daily.    Historical Provider, MD  brimonidine (ALPHAGAN P) 0.1 % SOLN Place 1 drop into both eyes 2 (two) times daily.     Historical Provider, MD  lacosamide 100 MG TABS Take 1 tablet (100 mg total) by mouth 2 (two) times daily. 10/05/16   Dustin Dhungel, MD  lovastatin (MEVACOR) 40 MG tablet Take 40 mg by mouth daily.     Historical Provider, MD  metFORMIN (GLUCOPHAGE) 500 MG tablet Take 500 mg by mouth daily.    Historical Provider,  MD  Morphine Sulfate (MORPHINE CONCENTRATE) 10 MG/0.5ML SOLN concentrated solution Take 0.25 mLs (5 mg total) by mouth every 6 (six) hours. 10/10/16   Dustin Dhungel, MD  omeprazole (PRILOSEC) 20 MG capsule Take 20 mg by mouth daily.    Historical Provider, MD  polyethylene glycol (MIRALAX / GLYCOLAX) packet Take 17 g by mouth daily as needed for mild constipation. 10/10/16   Dustin Dhungel, MD  QUEtiapine (SEROQUEL) 25 MG tablet Take 1 tablet (25 mg total) by mouth at bedtime. 10/10/16   Dustin Dhungel, MD    Physical Exam: Vitals:   11/03/16 1745 11/03/16 1800 11/03/16 1830 11/03/16 1845  BP: 175/65 176/97 150/73 151/74  Pulse: 60 (!) 39 (!) 53 68  Resp: 19 19 18 21   Temp:      TempSrc:      SpO2: 96% 95% 95% 95%  Weight:      Height:          Constitutional: No acute distress, calm, comfortable Eyes: PERTLA, lids and conjunctivae normal ENMT: Mucous membranes are moist. Posterior pharynx clear of any exudate or lesions.   Neck: normal, supple, no masses, no thyromegaly Respiratory: Coarse rhonchi at left mid-lung and base. Normal respiratory effort.  Cardiovascular: S1 & S2 heard, regular rate and rhythm. No extremity edema. No significant JVD. Abdomen: No distension, no tenderness, no masses palpated. Bowel sounds normal.  Musculoskeletal: no clubbing / cyanosis. No joint deformity upper and lower extremities. Normal muscle tone.  Skin: no significant rashes, lesions, ulcers. Warm, dry, well-perfused. Neurologic: CN 2-12 grossly intact. Moving all extremities. No dysarthria.  Psychiatric: Alert and oriented to person, place, and situation, but not oriented to month or year. Very pleasant and cooperative.     Labs on Admission: I have personally reviewed following labs and imaging studies  CBC:  Recent Labs Lab 11/03/16 1412  WBC 6.1  NEUTROABS 3.3  HGB 12.5*  HCT 37.8*  MCV 97.4  PLT 123456   Basic Metabolic Panel:  Recent Labs Lab 11/03/16 1412  NA 141  K 3.1*    CL 104  CO2 27  GLUCOSE 103*  BUN 9  CREATININE 0.97  CALCIUM 9.2   GFR: Estimated Creatinine Clearance: 55.4 mL/min (by C-G formula based on SCr of 0.97 mg/dL). Liver Function Tests: No results for input(s): AST, ALT, ALKPHOS, BILITOT, PROT, ALBUMIN in the last 168 hours. No results for input(s): LIPASE, AMYLASE in the last 168 hours. No results for input(s): AMMONIA in the last 168 hours. Coagulation Profile: No results for input(s): INR, PROTIME in the last 168 hours. Cardiac Enzymes: No results for input(s): CKTOTAL, CKMB, CKMBINDEX, TROPONINI in the last 168 hours. BNP (last 3 results) No results for input(s): PROBNP in the last 8760 hours. HbA1C: No results for input(s): HGBA1C in the last 72 hours. CBG: No results for input(s): GLUCAP in the last 168 hours. Lipid Profile: No results for input(s): CHOL, HDL, LDLCALC, TRIG, CHOLHDL, LDLDIRECT in the last 72  hours. Thyroid Function Tests: No results for input(s): TSH, T4TOTAL, FREET4, T3FREE, THYROIDAB in the last 72 hours. Anemia Panel: No results for input(s): VITAMINB12, FOLATE, FERRITIN, TIBC, IRON, RETICCTPCT in the last 72 hours. Urine analysis:    Component Value Date/Time   COLORURINE YELLOW 10/01/2016 2321   APPEARANCEUR CLEAR 10/01/2016 2321   LABSPEC 1.017 10/01/2016 2321   PHURINE 5.0 10/01/2016 2321   GLUCOSEU 50 (A) 10/01/2016 2321   HGBUR SMALL (A) 10/01/2016 2321   BILIRUBINUR NEGATIVE 10/01/2016 2321   KETONESUR NEGATIVE 10/01/2016 2321   PROTEINUR 100 (A) 10/01/2016 2321   UROBILINOGEN 2.0 (H) 02/16/2015 1143   NITRITE NEGATIVE 10/01/2016 2321   LEUKOCYTESUR NEGATIVE 10/01/2016 2321   Sepsis Labs: @LABRCNTIP (procalcitonin:4,lacticidven:4) )No results found for this or any previous visit (from the past 240 hour(s)).   Radiological Exams on Admission: Dg Chest 2 View  Result Date: 11/03/2016 CLINICAL DATA:  Cough EXAM: CHEST  2 VIEW COMPARISON:  03/08/2016 FINDINGS: Heart and mediastinal  contours are within normal limits. Patchy airspace disease noted posteriorly at the lung bases on the lateral view, likely in the left base. No effusions. No acute bony abnormality. IMPRESSION: Patchy opacity posteriorly on the lateral view, likely in the left lower lobe, concerning for pneumonia. Electronically Signed   By: Rolm Baptise M.D.   On: 11/03/2016 14:38    EKG: Not performed, will obtain as appropriate.   Assessment/Plan  1. HCAP  - Pt presents with worsening productive cough, dyspnea, and lethargy, found to have rhonchi on exam and corresponding infiltrate on CXR  - He was admitted for a week 1 month ago and will be continued on empiric vancomycin and cefepime initially  - Check sputum culture and gram stain, respiratory virus panel, and strep pneumo urine antigen - Continued on Tamiflu with resp virus panel pending; will maintain droplet precautions for now     2. Hypokalemia  - Serum potassium is 3.1 on admission  - KCl added to IVF and 20 mEq oral potassium given; 2 g IV magnesium administered  - Repeat chemistries in am    3. Paroxysmal atrial fibrillation  - In a sinus rhythm on admission  - CHADS-VASc 7 (age x2, HTN, DM, hx CVA x2) - Taken off of anticoagulation earlier this month given BRBPR and pt's desire for a more palliative approach to his care  - Not on rate-control agent; bradycardic at baseline    4. Seizure disorder  - First seizure one month ago, none since  - Continue lincosamide    5. Dementia with behavioral disturbance  - Appears to be stable on admission  - Continue Seroquel qHS   6. Hypertension  - BP mildly elevated on admission  - Pt is no longer taking antihypertensives at his SNF - Treat as needed with hydralazine IVP's for now   7. Type II DM  - A1c was 6.6% in June 2017 - Managed with metformin only at the SNF; this is held on admission  - Check CBG with meals and qHS  - Start a low-intensity sliding-scale correctional only   8.  Normocytic anemia  - Hgb is 12.5 on admission  - This is stable relative to recent priors and with no evidence of bleeding   9. Prostate cancer  - Appears to be stable - Continue Casodex   DVT prophylaxis: sq Lovenox  Code Status: DNR Family Communication: Discussed with patient Disposition Plan: Admit to med-surg Consults called: None Admission status: Inpatient    [place],  MD [facility] Pager [phone]  If 7PM-7AM, please contact night-coverage [email or web address] Password [facility]  11/03/2016, 7:23 PM

## 2016-11-03 NOTE — ED Triage Notes (Signed)
Pt arrives from home via [facility] c/o cough approx 7-8 days.  Pt's daughter reports pt tested negative for flu last week in SNF.  Pt denies CP, N/V, fever or chills. Pt's daughter reports pt has hx dementia, states pt "more lucid than usual" today. Pt AOx2 at baseline

## 2016-11-04 ENCOUNTER — Encounter (HOSPITAL_COMMUNITY): Payer: Self-pay

## 2016-11-04 DIAGNOSIS — J189 Pneumonia, unspecified organism: Principal | ICD-10-CM

## 2016-11-04 LAB — URINALYSIS, ROUTINE W REFLEX MICROSCOPIC
Bilirubin Urine: NEGATIVE
GLUCOSE, UA: NEGATIVE mg/dL
KETONES UR: 5 mg/dL — AB
Leukocytes, UA: NEGATIVE
NITRITE: NEGATIVE
Protein, ur: NEGATIVE mg/dL
Specific Gravity, Urine: 1.019 (ref 1.005–1.030)
pH: 6 (ref 5.0–8.0)

## 2016-11-04 LAB — CBC WITH DIFFERENTIAL/PLATELET
Basophils Absolute: 0 10*3/uL (ref 0.0–0.1)
Basophils Relative: 0 %
EOS ABS: 0.1 10*3/uL (ref 0.0–0.7)
Eosinophils Relative: 1 %
HEMATOCRIT: 36.9 % — AB (ref 39.0–52.0)
HEMOGLOBIN: 12.1 g/dL — AB (ref 13.0–17.0)
LYMPHS ABS: 2.2 10*3/uL (ref 0.7–4.0)
LYMPHS PCT: 35 %
MCH: 31.9 pg (ref 26.0–34.0)
MCHC: 32.8 g/dL (ref 30.0–36.0)
MCV: 97.4 fL (ref 78.0–100.0)
Monocytes Absolute: 0.6 10*3/uL (ref 0.1–1.0)
Monocytes Relative: 9 %
NEUTROS ABS: 3.5 10*3/uL (ref 1.7–7.7)
Neutrophils Relative %: 55 %
Platelets: 230 10*3/uL (ref 150–400)
RBC: 3.79 MIL/uL — AB (ref 4.22–5.81)
RDW: 13.8 % (ref 11.5–15.5)
WBC: 6.4 10*3/uL (ref 4.0–10.5)

## 2016-11-04 LAB — GLUCOSE, CAPILLARY
GLUCOSE-CAPILLARY: 109 mg/dL — AB (ref 65–99)
GLUCOSE-CAPILLARY: 116 mg/dL — AB (ref 65–99)
GLUCOSE-CAPILLARY: 130 mg/dL — AB (ref 65–99)
Glucose-Capillary: 102 mg/dL — ABNORMAL HIGH (ref 65–99)

## 2016-11-04 LAB — MRSA PCR SCREENING: MRSA by PCR: NEGATIVE

## 2016-11-04 LAB — BASIC METABOLIC PANEL
Anion gap: 9 (ref 5–15)
BUN: 11 mg/dL (ref 6–20)
CHLORIDE: 105 mmol/L (ref 101–111)
CO2: 28 mmol/L (ref 22–32)
Calcium: 8.8 mg/dL — ABNORMAL LOW (ref 8.9–10.3)
Creatinine, Ser: 1.02 mg/dL (ref 0.61–1.24)
GFR calc Af Amer: >60 mL/min (ref >60)
GFR calc non Af Amer: >60 mL/min (ref >60)
GLUCOSE: 114 mg/dL — AB (ref 65–99)
POTASSIUM: 3.1 mmol/L — AB (ref 3.5–5.1)
Sodium: 142 mmol/L (ref 135–145)

## 2016-11-04 LAB — INFLUENZA PANEL BY PCR (TYPE A & B)
INFLAPCR: NEGATIVE
Influenza B By PCR: NEGATIVE

## 2016-11-04 LAB — STREP PNEUMONIAE URINARY ANTIGEN: Strep Pneumo Urinary Antigen: NEGATIVE

## 2016-11-04 LAB — MAGNESIUM: Magnesium: 1.7 mg/dL (ref 1.7–2.4)

## 2016-11-04 MED ORDER — MORPHINE SULFATE (CONCENTRATE) 10 MG/0.5ML PO SOLN
5.0000 mg | Freq: Four times a day (QID) | ORAL | Status: DC | PRN
  Filled 2016-11-04: qty 0.5

## 2016-11-04 NOTE — Progress Notes (Addendum)
[facility] Progress Note  Patient: Dustin [name] [id number]   PCP: Osborne Casco, MD DOB: 07/25/1927   DOA: 11/03/2016   DOS: 11/04/2016   Date of Service: the patient was seen and examined on 11/04/2016  Brief hospital course: Pt. with PMH of dementia, CVA, DM-2, HTN, prostrate cancer; admitted on 11/03/2016, with complaint of cough, was found to have suspected HCAP. Pt was on comfort care at the time of last discharge. Currently further plan is continue Antibiotics and monitor culture.  Assessment and Plan: 1. HCAP  - Pt presents with worsening productive cough, dyspnea, and lethargy, found to have rhonchi on exam and corresponding infiltrate on CXR  - He was started on empiric vancomycin and cefepime initially  - sputum culture and gram stain, respiratory virus panel, and strep pneumo urine antigen is sent and now we will follow results. - stop Tamiflu  Since FLue PCR is negative.  2. Hypokalemia  Monitor.   3. Paroxysmal atrial fibrillation  - In a sinus rhythm on admission  - CHADS-VASc 7 (age x2, HTN, DM, hx CVA x2) - Taken off of anticoagulation earlier this month given BRBPR and pt's desire for a more palliative approach to his care  - Not on rate-control agent; bradycardic at baseline    4. Seizure disorder  - First seizure one month ago, none since  - Continue lincosamide    5. Dementia with behavioral disturbance  - Appears to be stable on admission  - Continue Seroquel qHS   6. Hypertension  - BP mildly elevated on admission  - Pt is no longer taking antihypertensives at his SNF - Treat as needed with hydralazine IVP's for now   7. Type II DM  - A1c was 6.6% in June 2017 - Managed with metformin only at the SNF; this is held on admission  Monitor.  8. Normocytic anemia  - Hgb is 12.5 on admission  - This is stable relative to recent priors and with no evidence of bleeding   9. Prostate cancer  - Appears to be stable - Continue  Casodex  Bowel regimen: last BM prior to admission Diet: cardiac diet DVT Prophylaxis: subcutaneous Heparin  Advance goals of care discussion: DNR DNI, was on comfort care at the time of last discharge.  [place] ON PHONE. Pt was on palliative care support as per their understanding and not on hospice at the time of discharge from SNF. Pt was at home more lucid and has been more combative here in hospital per them. Family would like to talk with mary again tomorrow, will give her a call in morning.  Continue current care plan of treating what is treatable. Change morphine from scheduled to as needed.   Family Communication: no family was present at bedside, at the time of interview.   Disposition:  Discharge to SNF. Expected discharge date: 11/05/2016,  Consultants: none Procedures: none  Antibiotics: Anti-infectives    Start     Dose/Rate Route Frequency Ordered Stop   11/04/16 0600  ceFEPIme (MAXIPIME) 1 g in dextrose 5 % 50 mL IVPB     1 g 100 mL/hr over 30 Minutes Intravenous Every 12 hours 11/03/16 1845     11/03/16 2200  oseltamivir (TAMIFLU) capsule 75 mg  Status:  Discontinued     75 mg Oral 2 times daily 11/03/16 1943 11/04/16 0804   11/03/16 2000  vancomycin (VANCOCIN) IVPB 750 mg/150 ml premix  Status:  Discontinued     750 mg 150 mL/hr  over 60 Minutes Intravenous Every 12 hours 11/03/16 1845 11/04/16 0806   11/03/16 1830  ceFEPIme (MAXIPIME) 1 g in dextrose 5 % 50 mL IVPB     1 g 100 mL/hr over 30 Minutes Intravenous  Once 11/03/16 1826 11/03/16 2018        Subjective: feeling better, no acute complains.  Objective: Physical Exam: Vitals:   11/03/16 2015 11/03/16 2030 11/03/16 2110 11/04/16 0456  BP: 106/77 157/71 (!) 108/93 (!) 162/73  Pulse: 62 64 62 (!) 55  Resp: 17 19 18 16   Temp:   98.7 F (37.1 C) 98.4 F (36.9 C)  TempSrc:   Oral Oral  SpO2: 97% 94% 97% 95%  Weight:   82.1 kg (181 lb)   Height:   5\' 10"  (1.778 m)      Intake/Output Summary (Last 24 hours) at 11/04/16 1334 Last data filed at 11/04/16 0456  Gross per 24 hour  Intake                0 ml  Output              200 ml  Net             -200 ml   Filed Weights   11/03/16 1407 11/03/16 2110  Weight: 82.1 kg (181 lb) 82.1 kg (181 lb)    General: Alert, Awake and Oriented to Time, Place and Person. Appear in mild distress, affect appropriate Eyes: PERRL, Conjunctiva normal ENT: Oral Mucosa clear moist. Neck: no JVD, no Abnormal Mass Or lumps Cardiovascular: S1 and S2 Present, no Murmur, Respiratory: Bilateral Air entry equal and Decreased, no use of accessory muscle, no Crackles, bilateral wheezes Abdomen: Bowel Sound present, Soft and no tenderness Skin: no redness, no Rash, no induration Extremities: no Pedal edema, no calf tenderness Neurologic: Grossly no focal neuro deficit. Bilaterally Equal motor strength  Data Reviewed: CBC:  Recent Labs Lab 11/03/16 1412 11/04/16 0438  WBC 6.1 6.4  NEUTROABS 3.3 3.5  HGB 12.5* 12.1*  HCT 37.8* 36.9*  MCV 97.4 97.4  PLT 213 123456   Basic Metabolic Panel:  Recent Labs Lab 11/03/16 1412 11/04/16 0438  NA 141 142  K 3.1* 3.1*  CL 104 105  CO2 27 28  GLUCOSE 103* 114*  BUN 9 11  CREATININE 0.97 1.02  CALCIUM 9.2 8.8*  MG  --  1.7    Liver Function Tests: No results for input(s): AST, ALT, ALKPHOS, BILITOT, PROT, ALBUMIN in the last 168 hours. No results for input(s): LIPASE, AMYLASE in the last 168 hours. No results for input(s): AMMONIA in the last 168 hours. Coagulation Profile: No results for input(s): INR, PROTIME in the last 168 hours. Cardiac Enzymes: No results for input(s): CKTOTAL, CKMB, CKMBINDEX, TROPONINI in the last 168 hours. BNP (last 3 results) No results for input(s): PROBNP in the last 8760 hours.  CBG:  Recent Labs Lab 11/03/16 2121 11/04/16 0541 11/04/16 1202  GLUCAP 148* 109* 116*    Studies: Dg Chest 2 View  Result Date:  11/03/2016 CLINICAL DATA:  Cough EXAM: CHEST  2 VIEW COMPARISON:  03/08/2016 FINDINGS: Heart and mediastinal contours are within normal limits. Patchy airspace disease noted posteriorly at the lung bases on the lateral view, likely in the left base. No effusions. No acute bony abnormality. IMPRESSION: Patchy opacity posteriorly on the lateral view, likely in the left lower lobe, concerning for pneumonia. Electronically Signed   By: Rolm Baptise M.D.   On: 11/03/2016 14:38  Scheduled Meds: . bicalutamide  50 mg Oral Daily  . brimonidine  1 drop Both Eyes BID  . ceFEPime (MAXIPIME) IV  1 g Intravenous Q12H  . enoxaparin (LOVENOX) injection  40 mg Subcutaneous QHS  . insulin aspart  0-5 Units Subcutaneous QHS  . insulin aspart  0-9 Units Subcutaneous TID WC  . lacosamide  100 mg Oral BID  . magnesium sulfate 1 - 4 g bolus IVPB  2 g Intravenous Once  . morphine CONCENTRATE  5 mg Oral Q6H  . pantoprazole  40 mg Oral Daily  . pravastatin  40 mg Oral q1800  . QUEtiapine  25 mg Oral QHS   Continuous Infusions: PRN Meds: acetaminophen, polyethylene glycol  Time spent: 30 minutes  Author: Berle Mull, MD [facility] Pager: [phone] 11/04/2016 1:34 PM  If 7PM-7AM, please contact night-coverage at [email or web address], password [facility]

## 2016-11-04 NOTE — Progress Notes (Signed)
Called respiratory about getting a flutter valve.

## 2016-11-05 DIAGNOSIS — Z515 Encounter for palliative care: Secondary | ICD-10-CM

## 2016-11-05 DIAGNOSIS — F0391 Unspecified dementia with behavioral disturbance: Secondary | ICD-10-CM

## 2016-11-05 LAB — GLUCOSE, CAPILLARY
GLUCOSE-CAPILLARY: 115 mg/dL — AB (ref 65–99)
GLUCOSE-CAPILLARY: 158 mg/dL — AB (ref 65–99)
Glucose-Capillary: 103 mg/dL — ABNORMAL HIGH (ref 65–99)
Glucose-Capillary: 174 mg/dL — ABNORMAL HIGH (ref 65–99)

## 2016-11-05 MED ORDER — SENNOSIDES-DOCUSATE SODIUM 8.6-50 MG PO TABS
1.0000 | ORAL_TABLET | Freq: Two times a day (BID) | ORAL | Status: DC
  Administered 2016-11-05 – 2016-11-07 (×2): 1 via ORAL
  Filled 2016-11-05 (×4): qty 1

## 2016-11-05 MED ORDER — PREDNISONE 20 MG PO TABS
50.0000 mg | ORAL_TABLET | Freq: Every day | ORAL | Status: DC
  Administered 2016-11-05: 11:00:00 50 mg via ORAL
  Filled 2016-11-05: qty 2

## 2016-11-05 MED ORDER — IPRATROPIUM-ALBUTEROL 0.5-2.5 (3) MG/3ML IN SOLN
3.0000 mL | Freq: Four times a day (QID) | RESPIRATORY_TRACT | Status: DC
  Administered 2016-11-05: 3 mL via RESPIRATORY_TRACT
  Filled 2016-11-05 (×2): qty 3

## 2016-11-05 MED ORDER — GUAIFENESIN ER 600 MG PO TB12
600.0000 mg | ORAL_TABLET | Freq: Two times a day (BID) | ORAL | Status: DC
  Administered 2016-11-05 – 2016-11-07 (×3): 600 mg via ORAL
  Filled 2016-11-05 (×5): qty 1

## 2016-11-05 MED ORDER — QUETIAPINE FUMARATE 25 MG PO TABS
25.0000 mg | ORAL_TABLET | Freq: Two times a day (BID) | ORAL | Status: DC
  Administered 2016-11-05 (×2): 25 mg via ORAL
  Filled 2016-11-05 (×4): qty 1

## 2016-11-05 MED ORDER — AMLODIPINE BESYLATE 5 MG PO TABS
5.0000 mg | ORAL_TABLET | Freq: Every day | ORAL | Status: DC
  Administered 2016-11-05 – 2016-11-07 (×2): 5 mg via ORAL
  Filled 2016-11-05 (×3): qty 1

## 2016-11-05 MED ORDER — HALOPERIDOL LACTATE 5 MG/ML IJ SOLN
1.0000 mg | Freq: Once | INTRAMUSCULAR | Status: AC
  Administered 2016-11-05: 1 mg via INTRAVENOUS
  Filled 2016-11-05: qty 1

## 2016-11-05 MED ORDER — POLYETHYLENE GLYCOL 3350 17 G PO PACK
17.0000 g | PACK | Freq: Every day | ORAL | Status: DC
  Administered 2016-11-05 – 2016-11-07 (×2): 17 g via ORAL
  Filled 2016-11-05 (×3): qty 1

## 2016-11-05 NOTE — Progress Notes (Signed)
[facility] Progress Note  Patient: Dustin [name] [id number]   PCP: Osborne Casco, MD DOB: 03-05-1927   DOA: 11/03/2016   DOS: 11/05/2016   Date of Service: the patient was seen and examined on 11/05/2016  Brief hospital course: Pt. with PMH of dementia, CVA, DM-2, HTN, prostrate cancer; admitted on 11/03/2016, with complaint of cough, was found to have suspected HCAP. Pt was on comfort care at the time of last discharge. Currently further plan is continue Antibiotics and monitor culture.  Assessment and Plan: 1. HCAP  - Pt presents with worsening productive cough, dyspnea, and lethargy, found to have rhonchi on exam and corresponding infiltrate on CXR  - He was started on empiric vancomycin and cefepime initially  - sputum culture and gram stain, respiratory virus panel, and strep pneumo urine antigen is sent and now we will follow results. - stop Tamiflu Since Flu PCR is negative. - Adding Mucinex, duo nebs and flutter device. - Patient not satisfactorily able to come performed a flutter device and also does not have an accident cough. Remains at high risk for poor outcome.  2. Hypokalemia  Monitor.   3. Paroxysmal atrial fibrillation  - In a sinus rhythm on admission  - CHADS-VASc 7 (age x2, HTN, DM, hx CVA x2) - Taken off of anticoagulation earlier this month given BRBPR and pt's desire for a more palliative approach to his care  - Not on rate-control agent; bradycardic at baseline    4. Seizure disorder  - First seizure one month ago, none since  - Continue lincosamide    5. Dementia with behavioral disturbance  - Appears to be stable on admission  - Increase Seroquel to twice a day  6. Hypertension  - BP mildly elevated - Pt is no longer taking antihypertensives at his SNF - Starting on schedule Norvasc  7. Type II DM  - A1c was 6.6% in June 2017 - Managed with metformin only at the SNF; this is held on admission  Monitor.  8. Normocytic  anemia  - Hgb is 12.5 on admission  - This is stable relative to recent priors and with no evidence of bleeding   9. Prostate cancer  - Appears to be stable - Continue Casodex  Bowel regimen: last BM prior to admission, bowel regimen initiated Diet: cardiac diet DVT Prophylaxis: subcutaneous Heparin  Advance goals of care discussion: DNR DNI, was on comfort care at the time of last discharge.  [place] ON PHONE. Pt was on palliative care support as per their understanding and not on hospice at the time of discharge from SNF. Pt was at home more lucid and has been more combative here in hospital per them. After discussing with the palliative care, family has agreed that at the time of the discharge goal of care would be hospice, referral sent out, comfort and dignity are the main focus of care in the hospital. Prefer to avoid hospitalization.  Family Communication: no family was present at bedside, at the time of interview.   Disposition:  Discharge to home with hospice, SNF recommended by PT. Expected discharge date: 11/06/2016, stabilization of symptoms.  Consultants: Palliative care Procedures: none  Antibiotics: Anti-infectives    Start     Dose/Rate Route Frequency Ordered Stop   11/04/16 0600  ceFEPIme (MAXIPIME) 1 g in dextrose 5 % 50 mL IVPB     1 g 100 mL/hr over 30 Minutes Intravenous Every 12 hours 11/03/16 1845     11/03/16 2200  oseltamivir (TAMIFLU) capsule 75 mg  Status:  Discontinued     75 mg Oral 2 times daily 11/03/16 1943 11/04/16 0804   11/03/16 2000  vancomycin (VANCOCIN) IVPB 750 mg/150 ml premix  Status:  Discontinued     750 mg 150 mL/hr over 60 Minutes Intravenous Every 12 hours 11/03/16 1845 11/04/16 0806   11/03/16 1830  ceFEPIme (MAXIPIME) 1 g in dextrose 5 % 50 mL IVPB     1 g 100 mL/hr over 30 Minutes Intravenous  Once 11/03/16 1826 11/03/16 2018       Subjective: feeling better, no acute complains.Later on become agitated and  confused.  Objective: Physical Exam: Vitals:   11/04/16 2013 11/05/16 0545 11/05/16 1507 11/05/16 1515  BP: (!) 174/71 (!) 152/96 (!) 177/99   Pulse: 62 66 87   Resp: 18 18 18    Temp: 98.3 F (36.8 C) 98.1 F (36.7 C) 98.3 F (36.8 C)   TempSrc: Oral Oral Oral   SpO2: 100% 98% 94% 94%  Weight:      Height:        Intake/Output Summary (Last 24 hours) at 11/05/16 1617 Last data filed at 11/05/16 1500  Gross per 24 hour  Intake              530 ml  Output              250 ml  Net              280 ml   Filed Weights   11/03/16 1407 11/03/16 2110  Weight: 82.1 kg (181 lb) 82.1 kg (181 lb)    General: Alert, Awake and Oriented to Time, Place and Person. Appear in mild distress, affect appropriate Eyes: PERRL, Conjunctiva normal ENT: Oral Mucosa clear moist. Neck: no JVD, no Abnormal Mass Or lumps Cardiovascular: S1 and S2 Present, no Murmur, Respiratory: Bilateral Air entry equal and Decreased, no use of accessory muscle, no Crackles, bilateral wheezes Abdomen: Bowel Sound present, Soft and no tenderness Skin: no redness, no Rash, no induration Extremities: no Pedal edema, no calf tenderness Neurologic: Grossly no focal neuro deficit. Bilaterally Equal motor strength  Data Reviewed: CBC:  Recent Labs Lab 11/03/16 1412 11/04/16 0438  WBC 6.1 6.4  NEUTROABS 3.3 3.5  HGB 12.5* 12.1*  HCT 37.8* 36.9*  MCV 97.4 97.4  PLT 213 123456   Basic Metabolic Panel:  Recent Labs Lab 11/03/16 1412 11/04/16 0438  NA 141 142  K 3.1* 3.1*  CL 104 105  CO2 27 28  GLUCOSE 103* 114*  BUN 9 11  CREATININE 0.97 1.02  CALCIUM 9.2 8.8*  MG  --  1.7    Liver Function Tests: No results for input(s): AST, ALT, ALKPHOS, BILITOT, PROT, ALBUMIN in the last 168 hours. No results for input(s): LIPASE, AMYLASE in the last 168 hours. No results for input(s): AMMONIA in the last 168 hours. Coagulation Profile: No results for input(s): INR, PROTIME in the last 168 hours. Cardiac  Enzymes: No results for input(s): CKTOTAL, CKMB, CKMBINDEX, TROPONINI in the last 168 hours. BNP (last 3 results) No results for input(s): PROBNP in the last 8760 hours.  CBG:  Recent Labs Lab 11/04/16 1202 11/04/16 1654 11/04/16 2101 11/05/16 0614 11/05/16 1118  GLUCAP 116* 102* 130* 103* 115*    Studies: No results found.   Scheduled Meds: . bicalutamide  50 mg Oral Daily  . brimonidine  1 drop Both Eyes BID  . ceFEPime (MAXIPIME) IV  1 g Intravenous  Q12H  . enoxaparin (LOVENOX) injection  40 mg Subcutaneous QHS  . guaiFENesin  600 mg Oral BID  . insulin aspart  0-5 Units Subcutaneous QHS  . insulin aspart  0-9 Units Subcutaneous TID WC  . ipratropium-albuterol  3 mL Nebulization Q6H  . lacosamide  100 mg Oral BID  . magnesium sulfate 1 - 4 g bolus IVPB  2 g Intravenous Once  . pantoprazole  40 mg Oral Daily  . pravastatin  40 mg Oral q1800  . predniSONE  50 mg Oral Q breakfast  . QUEtiapine  25 mg Oral BID   Continuous Infusions: PRN Meds: acetaminophen, morphine CONCENTRATE, polyethylene glycol  Time spent: 30 minutes  Author: Berle Mull, MD [facility] Pager: [phone] 11/05/2016 4:17 PM  If 7PM-7AM, please contact night-coverage at [email or web address], password [facility]

## 2016-11-05 NOTE — Evaluation (Signed)
Physical Therapy Evaluation Patient Details Name: Lenis Mohl MRN: [id number] DOB: Jul 14, 1927 Today's Date: 11/05/2016   History of Present Illness  Nash Ridpath is a 83 y.o. male with medical history significant for paroxysmal atrial fibrillation recently taken off of anticoagulation, type 2 diabetes mellitus, hypertension, dementia with behavioral disturbance, prostate cancer, history of multiple CVAs, and seizure disorder, now presenting to the emergency department for evaluation of a worsening cough despite treatment with Tamiflu at his SNF. diagnosis of HCAP  Clinical Impression   Pt admitted with above diagnosis. Pt currently with functional limitations due to the deficits listed below (see PT Problem List). Mr. Cannizzaro requires mod max assist for mobility and transfers; SNF level of care is appropriate for him; Worth considering a trial of PT, depending on his ability to participate; Noted Palliative Care Team consult as well, and will take their lead;  Pt will benefit from skilled PT to increase their independence and safety with mobility to allow discharge to the venue listed below.       Follow Up Recommendations SNF    Equipment Recommendations  Other (comment) (TBD at SNF)    Recommendations for Other Services       Precautions / Restrictions Precautions Precautions: Fall      Mobility  Bed Mobility Overal bed mobility: Needs Assistance Bed Mobility: Supine to Sit     Supine to sit: Mod assist     General bed mobility comments: Mod assist and use of bed pad to scoot hips to EOB and clear LEs from edge to get up; mod assist to elevate trunk to sit  Transfers Overall transfer level: Needs assistance Equipment used: 1 person hand held assist;2 person hand held assist Transfers: Sit to/from [employer] Sit to Stand: Max assist;+2 safety/equipment   Squat pivot transfers: Max assist;+2 physical assistance     General transfer  comment: 2 attempts for sit to stand, with bilateral support and difficulty reaching fully upright posture; Performed squat pivot transfer with +2 assistance and close guard/knee block; trunk flexed during transfer  Ambulation/Gait                Stairs            Wheelchair Mobility    Modified Rankin (Stroke Patients Only)       Balance                                             Pertinent Vitals/Pain Pain Assessment: No/denies pain    Home Living Family/patient expects to be discharged to:: Skilled nursing facility                      Prior Function Level of Independence: Needs assistance   Gait / Transfers Assistance Needed: limited mobility prior to recent admission with dc to SNF: was using WC for mobility and RW for transfers           Hand Dominance   Dominant Hand: Right    Extremity/Trunk Assessment   Upper Extremity Assessment Upper Extremity Assessment: Generalized weakness    Lower Extremity Assessment Lower Extremity Assessment: Generalized weakness (Bil hamstring tightness noted)       Communication   Communication: Expressive difficulties (with history of dementia)  Cognition Arousal/Alertness: Awake/alert Behavior During Therapy: WFL for tasks assessed/performed;Flat affect Overall Cognitive Status: No family/caregiver present to determine baseline  cognitive functioning                      General Comments General comments (skin integrity, edema, etc.): Session conducted on Room Air, and O2 sats ranged from 86 (observed lowest with activity) to 96% in recliner at end of session; RN aware    Exercises     Assessment/Plan    PT Assessment Patient needs continued PT services  PT Problem List Decreased strength;Decreased range of motion;Decreased activity tolerance;Decreased balance;Decreased mobility;Decreased coordination;Decreased cognition;Decreased knowledge of use of DME;Decreased  safety awareness;Cardiopulmonary status limiting activity          PT Treatment Interventions DME instruction;Gait training;Functional mobility training;Therapeutic activities;Therapeutic exercise;Balance training;Neuromuscular re-education;Cognitive remediation;Patient/family education    PT Goals (Current goals can be found in the Care Plan section)  Acute Rehab PT Goals Patient Stated Goal: Did not state, but agreeable to getting OOB PT Goal Formulation: Patient unable to participate in goal setting Time For Goal Achievement: 11/19/16 Potential to Achieve Goals: Good    Frequency Min 2X/week   Barriers to discharge        Co-evaluation               End of Session Equipment Utilized During Treatment: Gait belt Activity Tolerance: Patient tolerated treatment well Patient left: in chair;with call bell/phone within reach;with chair alarm set Nurse Communication: Mobility status         Time: [id number] PT Time Calculation (min) (ACUTE ONLY): 24 min   Charges:   PT Evaluation $PT Eval Moderate Complexity: 1 Procedure PT Treatments $Therapeutic Activity: 8-22 mins   PT G Codes:        [place] 11/05/2016, 10:21 AM  Roney Marion, [place] Pager [phone] Office [phone]

## 2016-11-05 NOTE — Consult Note (Signed)
Consultation Note Date: 11/05/2016   Patient Name: Dustin [name]  DOB: 07/03/1927  MRN: [id number]  Age / Sex: 83 y.o., male  PCP: Kelton Pillar, MD Referring Physician: Lavina Hamman, MD  Reason for Consultation: Establishing goals of care and Psychosocial/spiritual support  HPI/Patient Profile: 83 y.o. male  admitted on 11/03/2016 with past medical history significant for paroxysmal atrial fibrillation recently taken off of anticoagulation, type 2 diabetes mellitus, hypertension, dementia with behavioral disturbance, prostate cancer, history of multiple CVAs, and seizure disorder, now presenting to the emergency department for evaluation of a worsening cough despite treatment with Tamiflu at his SNF/[facility].  Per family and nursing facility personnel, there has been a worsening cough and dyspnea over the past week.   He was hospitalized 1 month ago for new onset seizures and discharged on Vimpat.  He had previously been on Pradaxa for A. fib, but developed bright red blood per rectum during the recent hospitalization, and given the patient's desire for more palliative approach to his care, the anticoagulant was discontinued.  Focus of care on past discharge was comfort.  In the  ED, patient is found to be afebrile.   Chest x-ray  notable for patchy opacity in the left lower lobe concerning for pneumonia.  Patient was treated with empiric vancomycin and cefepime in the emergency department.  Continues at his baseline significant cognitive deficit.  He has a wet cough.  His wife is a patient on 5W admitted with flu.  Family face advanced directive decisions and anticpatroy care needs.    Clinical Assessment and Goals of Care:  This NP Wadie Lessen reviewed medical records, received report from team, assessed the patient and then meet at the patient's bedside along with his daughter/Carla and SIL/Rick and  then again at the bedside of his wife ( she is in rm 5W 26 with flu) to discuss diagnosis,(natural trajectory/expectations of dementia) of  prognosis, GOC, EOL wishes disposition and options.  A detailed discussion was had today regarding advanced directives.  Concepts specific to code status, artifical feeding and hydration, continued IV antibiotics and rehospitalization was had.  The difference between a aggressive medical intervention path  and a palliative comfort care path for this patient at this time was had.  Values and goals of care important to patient and family were attempted to be elicited.  MOST form completed in the past/ family to review again.  Concept of Hospice was detailed  Natural trajectory and expectations at EOL were discussed.  Questions and concerns addressed.   Family encouraged to call with questions or concerns.  PMT will continue to support holistically.    SUMMARY OF RECOMMENDATIONS    - discharge home with hospice services, will need transportation home -comfort and dignity are main focus of care, avoid rehospitalization  -complete treatment for pneumonia   -family to consider use of antibiotics in the future if/when infection occurs  Code Status/Advance Care Planning:  DNR  Palliative Prophylaxis:   Aspiration, Bowel Regimen, Delirium Protocol, Frequent Pain Assessment and Oral  Care   Psycho-social/Spiritual:   Desire for further Chaplaincy support:no  Additional Recommendations: Education on Hospice  Prognosis:   < 6 months, high risk for decompensation  Discharge Planning: Home with Hospice      Primary Diagnoses: Present on Admission: . HCAP (healthcare-associated pneumonia) . Normocytic anemia . Essential hypertension . Dementia with behavioral disturbance . Chronic atrial fibrillation ([place]) . Hypokalemia . Prostate cancer [facility])   I have reviewed the medical record, interviewed the patient and family, and examined the patient.  The following aspects are pertinent.  Past Medical History:  Diagnosis Date  . Arthritis   . Bilateral knee pain   . Cancer ([place]) 2012   skin cancers  . Chronic atrial fibrillation (HCC)    a. CHA2DS2VASc = 6-->pradaxa;  b. 03/2016 Echo: EF 60-65%, no rwma, mildly dil LA.  [name] Dementia   . Diabetes mellitus without complication ([place])   . Hyperlipidemia   . Hypertensive heart disease   . Pancreatitis   . Poor balance   . Prostate cancer ([place])   . Stroke [facility])    a. 02/2016 Left lentiform nucleus/corona radiata infarcts no MRI.   Social History   Social History  . Marital status: Married    Spouse name: N/A  . Number of children: N/A  . Years of education: N/A   Social History Main Topics  . Smoking status: Former Research scientist (life sciences)  . Smokeless tobacco: Never Used  . Alcohol use 1.2 oz/week    1 Cans of beer, 1 Glasses of wine per week     Comment: "a beer once in a while"  . Drug use: No  . Sexual activity: Not Asked   Other Topics Concern  . None   Social History Narrative   Lives in [place].   Family History  Problem Relation Age of Onset  . Stroke Father   . Colon cancer Father   . Diabetes Mellitus II Sister   . Heart attack Neg Hx   . Prostate cancer Neg Hx    Scheduled Meds: . bicalutamide  50 mg Oral Daily  . brimonidine  1 drop Both Eyes BID  . ceFEPime (MAXIPIME) IV  1 g Intravenous Q12H  . enoxaparin (LOVENOX) injection  40 mg Subcutaneous QHS  . guaiFENesin  600 mg Oral BID  . insulin aspart  0-5 Units Subcutaneous QHS  . insulin aspart  0-9 Units Subcutaneous TID WC  . ipratropium-albuterol  3 mL Nebulization Q6H  . lacosamide  100 mg Oral BID  . magnesium sulfate 1 - 4 g bolus IVPB  2 g Intravenous Once  . pantoprazole  40 mg Oral Daily  . pravastatin  40 mg Oral q1800  . predniSONE  50 mg Oral Q breakfast  . QUEtiapine  25 mg Oral BID   Continuous Infusions: PRN Meds:.acetaminophen, morphine CONCENTRATE, polyethylene glycol Medications Prior to  Admission:  Prior to Admission medications   Medication Sig Start Date End Date Taking? Authorizing Provider  dabigatran (PRADAXA) 150 MG CAPS capsule Take 150 mg by mouth 2 (two) times daily.   Yes Historical Provider, MD  divalproex (DEPAKOTE) 125 MG DR tablet Take 125 mg by mouth 2 (two) times daily.   Yes Historical Provider, MD  lacosamide 100 MG TABS Take 1 tablet (100 mg total) by mouth 2 (two) times daily. 10/05/16  Yes Nishant Dhungel, MD  lovastatin (MEVACOR) 40 MG tablet Take 40 mg by mouth daily.    Yes Historical Provider, MD  metFORMIN (GLUCOPHAGE) 500 MG tablet  Take 500 mg by mouth daily.   Yes Historical Provider, MD  oseltamivir (TAMIFLU) 75 MG capsule Take 75 mg by mouth daily. 11/01/16  Yes Historical Provider, MD  polyethylene glycol (MIRALAX / GLYCOLAX) packet Take 17 g by mouth daily as needed for mild constipation. 10/10/16  Yes Nishant Dhungel, MD  QUEtiapine (SEROQUEL) 25 MG tablet Take 1 tablet (25 mg total) by mouth at bedtime. Patient taking differently: Take 50 mg by mouth at bedtime.  10/10/16  Yes Nishant Dhungel, MD  sertraline (ZOLOFT) 25 MG tablet Take 25 mg by mouth daily.   Yes Historical Provider, MD  valsartan-hydrochlorothiazide (DIOVAN-HCT) 80-12.5 MG tablet Take 1 tablet by mouth daily. 08/29/16  Yes Historical Provider, MD  Morphine Sulfate (MORPHINE CONCENTRATE) 10 MG/0.5ML SOLN concentrated solution Take 0.25 mLs (5 mg total) by mouth every 6 (six) hours. Patient not taking: Reported on 11/05/2016 10/10/16   Louellen Molder, MD   Allergies  Allergen Reactions  . Tape Other (See Comments)    SKIN IS VERY THIN AND WILL BRUISE AND TEAR EASILY!!   Review of Systems  Unable to perform ROS: Dementia    Physical Exam  Constitutional: He appears ill.  HENT:  Mouth/Throat: Mucous membranes are dry.  Cardiovascular: Normal rate, regular rhythm and normal heart sounds.   Pulmonary/Chest: He has decreased breath sounds in the right lower field and the left lower  field. He has rhonchi.  Musculoskeletal:  generalized weakness and atrophy  Neurological: He is alert.  Psychiatric: Cognition and memory are impaired.  - confused to place and situation     Vital Signs: BP (!) 152/96 (BP Location: Right Arm)   Pulse 66   Temp 98.1 F (36.7 C) (Oral)   Resp 18   Ht 5\' 10"  (1.778 m)   Wt 82.1 kg (181 lb)   SpO2 98%   BMI 25.97 kg/m  Pain Assessment: No/denies pain POSS *See Group Information*: 1-Acceptable,Awake and alert Pain Score: Asleep   SpO2: SpO2: 98 % O2 Device:SpO2: 98 % O2 Flow Rate: .   IO: Intake/output summary:  Intake/Output Summary (Last 24 hours) at 11/05/16 1344 Last data filed at 11/05/16 0816  Gross per 24 hour  Intake              220 ml  Output              250 ml  Net              -30 ml    LBM: Last BM Date:  (pta) Baseline Weight: Weight: 82.1 kg (181 lb) Most recent weight: Weight: 82.1 kg (181 lb)     Palliative Assessment/Data: 30% at best   Flowsheet Rows   Flowsheet Row Most Recent Value  Intake Tab  Referral Department  Hospitalist  Unit at Time of Referral  Orthopedic Unit  Palliative Care Primary Diagnosis  Cancer  Date Notified  11/05/16  Palliative Care Type  Return patient Palliative Care  Reason for referral  Clarify Goals of Care  Date of Admission  11/03/16  # of days IP prior to Palliative referral  2  Clinical Assessment  Psychosocial & Spiritual Assessment  Palliative Care Outcomes     Discussed with Dr Posey Pronto Time In: 1230 Time Out: 1400 Time Total:  90 min Greater than 50%  of this time was spent counseling and coordinating care related to the above assessment and plan.  Signed by: Wadie Lessen, NP   Please contact Palliative Medicine Team phone at  [phone] for questions and concerns.  For individual provider: See Shea Evans

## 2016-11-06 LAB — GLUCOSE, CAPILLARY: GLUCOSE-CAPILLARY: 129 mg/dL — AB (ref 65–99)

## 2016-11-06 MED ORDER — LEVOFLOXACIN IN D5W 750 MG/150ML IV SOLN
750.0000 mg | INTRAVENOUS | Status: DC
  Filled 2016-11-06: qty 150

## 2016-11-06 MED ORDER — IPRATROPIUM-ALBUTEROL 0.5-2.5 (3) MG/3ML IN SOLN
3.0000 mL | Freq: Four times a day (QID) | RESPIRATORY_TRACT | Status: DC
  Administered 2016-11-06: 3 mL via RESPIRATORY_TRACT
  Filled 2016-11-06: qty 3

## 2016-11-06 MED ORDER — HALOPERIDOL LACTATE 5 MG/ML IJ SOLN
2.0000 mg | Freq: Once | INTRAMUSCULAR | Status: AC
  Administered 2016-11-06: 2 mg via INTRAMUSCULAR
  Filled 2016-11-06: qty 1

## 2016-11-06 MED ORDER — HALOPERIDOL LACTATE 5 MG/ML IJ SOLN: 1.0000 mg | INTRAMUSCULAR | Status: DC

## 2016-11-06 MED ORDER — HALOPERIDOL LACTATE 5 MG/ML IJ SOLN
2.0000 mg | Freq: Four times a day (QID) | INTRAMUSCULAR | Status: AC | PRN
  Administered 2016-11-06: 2 mg via INTRAVENOUS
  Filled 2016-11-06: qty 1

## 2016-11-06 MED ORDER — LEVOFLOXACIN 500 MG PO TABS
750.0000 mg | ORAL_TABLET | ORAL | Status: DC
  Administered 2016-11-06: 750 mg via ORAL
  Filled 2016-11-06: qty 2

## 2016-11-06 MED ORDER — PREDNISONE 20 MG PO TABS
20.0000 mg | ORAL_TABLET | Freq: Every day | ORAL | Status: DC
  Filled 2016-11-06: qty 1

## 2016-11-06 MED ORDER — HALOPERIDOL LACTATE 5 MG/ML IJ SOLN
1.0000 mg | Freq: Four times a day (QID) | INTRAMUSCULAR | Status: DC | PRN
  Administered 2016-11-06: 1 mg via INTRAVENOUS
  Filled 2016-11-06: qty 1

## 2016-11-06 MED ORDER — QUETIAPINE FUMARATE 50 MG PO TABS
50.0000 mg | ORAL_TABLET | Freq: Two times a day (BID) | ORAL | Status: DC
  Administered 2016-11-06: 25 mg via ORAL
  Administered 2016-11-07: 50 mg via ORAL
  Filled 2016-11-06 (×2): qty 1

## 2016-11-06 MED ORDER — IPRATROPIUM-ALBUTEROL 0.5-2.5 (3) MG/3ML IN SOLN
3.0000 mL | Freq: Three times a day (TID) | RESPIRATORY_TRACT | Status: DC
  Administered 2016-11-06 – 2016-11-07 (×2): 3 mL via RESPIRATORY_TRACT
  Filled 2016-11-06 (×3): qty 3

## 2016-11-06 NOTE — Care Management Note (Signed)
Case Management Note  Patient Details  Name: Dustin [name] MRN: [id number] Date of Birth: 1927/06/02  Subjective/Objective:   83 yr old gentleman admitted with pneumonia.Has multiple co-morbidites .                Action/Plan: Case manager made referral to Hospice and [place] per daughter's request. CM has been in contact with [place] Liaison concerning discharge plan for patient. Patient's daughter Angela Nevin is contact person and POA: [phone]. As of this afternoon patient's eligibility has been  approved . CM will continue to monitor.    Expected Discharge Date:    pending              Expected Discharge Plan:   Home with Hospice Care  In-House Referral:  Hospice / Palliative Care  Discharge planning Services  CM Consult  Post Acute Care Choice:    Choice offered to:  Adult Children  DME Arranged:  N/A DME Agency:     HH Arranged:    HH Agency:  [facility]  Status of Service:  In process, will continue to follow  If discussed at Long Length of Stay Meetings, dates discussed:    Additional Comments:  Ninfa Meeker, RN 11/06/2016, 4:26 PM

## 2016-11-06 NOTE — Progress Notes (Addendum)
[facility] Liaison :  Notified by Skip Mayer, of patient/family request for [facility] [place] services at home after discharge.  Chart and patient information being reviewed with Dr. Brigid Re, [place] Director) and hospice eligibility is confirmed.  Writer spoke with Angela Nevin, daughter, at bedside to initiate education related to hospice philosophy, services and team approach to care.  Family is pleasant and very reasonable with goals of care.  Per discussion, plan is for discharge, possibly today or tomorrow, to home by PTAR.  Please send signed completed DNR form home with patient/family.  Patient will need prescriptions for discharge for comfort medications (for anxiety, behavioral).  DME needs discussed with patient's daughter.  She advised that she has hospital bed, Hoyer lift, BSC, shower chair at home.  Family requests only a OBT for delivery to the home at agreed upon time with [facility]. [facility] Chartered certified accountant, Gayland Curry, notified and will contact [place] to arrange delivery to the home.    The home address has been verified and is corrently in the chart.  Angela Nevin, daughter of patient, is the family member to be contacted to arrange time of delivery.  [facility] Referral Center aware of above. Completed d/c summary will need to be faxed to [facility] at [phone] when final.  Please notify [facility] when patient is ready to leave unit at discharge - call [phone] or [phone] after 5pm).  [facility] information and contact numbers have been given to [place], daughter, during visit.  Above information shared with Manuela Schwartz [facility].    Thank you for the referral,  Edyth Gunnels, RN, [facility] Liaison [phone]   All hospital liaison's are now on [place].

## 2016-11-06 NOTE — Progress Notes (Signed)
Daily Progress Note   Patient Name: Dustin [name]       Date: 11/06/2016 DOB: 1927-03-18  Age: 83 y.o. MRN#: [id number] Attending Physician: Lavina Hamman, MD Primary Care Physician: Osborne Casco, MD Admit Date: 11/03/2016  Reason for Consultation/Follow-up: Establishing goals of care, Non pain symptom management and Psychosocial/spiritual support  Subjective: -continued discussion with daughter and wife regarding current medical situation.    _discussed in detail the importance of utilization of medication to control his behaviors 2/2 dementia. Today I increased his Seroquel to 50 mg po bid.    Stressed importance of once he is home,  working with the hospice providers in making appropriate medicine adjustments to enhance comfort from a behavioral standpoint   Focus of care is comfort and dignity.  Again discussed expectations and trajectory with ES-dementia   Length of Stay: 3  Current Medications: Scheduled Meds:  . amLODipine  5 mg Oral Daily  . bicalutamide  50 mg Oral Daily  . brimonidine  1 drop Both Eyes BID  . ceFEPime (MAXIPIME) IV  1 g Intravenous Q12H  . enoxaparin (LOVENOX) injection  40 mg Subcutaneous QHS  . guaiFENesin  600 mg Oral BID  . insulin aspart  0-5 Units Subcutaneous QHS  . insulin aspart  0-9 Units Subcutaneous TID WC  . ipratropium-albuterol  3 mL Nebulization QID  . lacosamide  100 mg Oral BID  . pantoprazole  40 mg Oral Daily  . polyethylene glycol  17 g Oral Daily  . pravastatin  40 mg Oral q1800  . predniSONE  20 mg Oral Q breakfast  . QUEtiapine  25 mg Oral BID  . senna-docusate  1 tablet Oral BID    Continuous Infusions:   PRN Meds: acetaminophen, haloperidol lactate, morphine CONCENTRATE  Physical Exam          Vital  Signs: BP (!) 153/72 (BP Location: Left Arm)   Pulse 81   Temp 97.9 F (36.6 C) (Axillary)   Resp 18   Ht 5\' 10"  (1.778 m)   Wt 82.1 kg (181 lb)   SpO2 94%   BMI 25.97 kg/m  SpO2: SpO2: 94 % O2 Device: O2 Device: Not Delivered O2 Flow Rate:    Intake/output summary:  Intake/Output Summary (Last 24 hours) at 11/06/16 1001 Last data filed at 11/06/16 0700  Gross per 24 hour  Intake              360 ml  Output              300 ml  Net               60 ml   LBM: Last BM Date:  (PTA) Baseline Weight: Weight: 82.1 kg (181 lb) Most recent weight: Weight: 82.1 kg (181 lb)       Palliative Assessment/Data: 30 % at best    Flowsheet Rows   Flowsheet Row Most Recent Value  Intake Tab  Referral Department  Hospitalist  Unit at Time of Referral  Orthopedic Unit  Palliative Care Primary Diagnosis  Sepsis/Infectious Disease  Date Notified  11/05/16  Palliative Care Type  Return patient Palliative Care  Reason for referral  Clarify Goals of Care  Date of Admission  11/03/16  Date first seen by Palliative Care  11/05/16  # of days Palliative referral response time  0 Day(s)  # of days IP prior to Palliative referral  2  Clinical Assessment  Palliative Performance Scale Score  30%  Psychosocial & Spiritual Assessment  Palliative Care Outcomes      Patient Active Problem List   Diagnosis Date Noted  . HCAP (healthcare-associated pneumonia) 11/03/2016  . Hypokalemia 11/03/2016  . Prostate cancer ([place]) 11/03/2016  . Acute delirium 10/10/2016  . Adult failure to thrive syndrome   . Palliative care by specialist   . Encephalopathy, hypertensive 10/05/2016  . Sinus bradycardia 10/05/2016  . Pressure injury of skin 10/05/2016  . Wide-complex tachycardia ([place])   . Sick sinus syndrome ([place])   . Seizure disorder as sequela of cerebrovascular accident ([place]) 10/01/2016  . Acute encephalopathy 10/01/2016  . History of CVA (cerebrovascular accident)   . PAF (paroxysmal atrial  fibrillation) ([place])   . Normocytic anemia   . Cerebral infarction due to unspecified mechanism   . Acute CVA (cerebrovascular accident) ([place]) 03/09/2016  . Physical debility 03/09/2016  . Essential hypertension 03/09/2016  . Diabetes mellitus type 2, noninsulin dependent ([place]) 03/09/2016  . Chronic atrial fibrillation ([place]) 03/09/2016  . DNR (do not resuscitate) 03/09/2016  . Falls 03/09/2016  . Right leg weakness 03/09/2016  . Pancreatic mass 01/25/2016  . Dementia with behavioral disturbance   . Fall 02/15/2015  . Multiple fractures of ribs of left side 02/15/2015  . Traumatic pneumothorax 02/14/2015    Palliative Care Assessment & Plan    Assessment: Overall failure to thrive 2/2 dementia and multiple medical co-morbidites.  Focus is comfort.   Continues to decline  physicaly, functionally and cognitively.  Intermittent behavior issues.   Recommendations/Plan:  Agitation: Increase Seroquel to 50 mg po bid.   Consider Depakote sprinkles   Home with hospice, family is willing to secure more in home care as needed  Goals of Care and Additional Recommendations:  Limitations on Scope of Treatment: Full Comfort Care  Code Status:    Code Status Orders        Start     Ordered   11/03/16 1920  Do not attempt resuscitation (DNR)  Continuous    Question Answer Comment  In the event of cardiac or respiratory ARREST Do not call a "code blue"   In the event of cardiac or respiratory ARREST Do not perform Intubation, CPR, defibrillation or ACLS   In the event of cardiac or respiratory ARREST Use medication by any route, position, wound care, and other measures  to relive pain and suffering. May use oxygen, suction and manual treatment of airway obstruction as needed for comfort.      11/03/16 1923    Code Status History    Date Active Date Inactive Code Status Order ID Comments User Context   10/02/2016 12:16 AM 10/10/2016  8:01 PM DNR [id number]  Lily Kocher, MD Inpatient    03/09/2016  2:46 AM 03/13/2016  8:54 PM DNR [id number]  Roney Jaffe, MD Inpatient   02/14/2015 10:47 AM 02/18/2015  6:44 PM Full Code [id number]  Earnstine Regal, PA-C ED       Prognosis:   < 6 months  Discharge Planning:  Home with Hospice  Care plan was discussed with Dr Posey Pronto  Thank you for allowing the Palliative Medicine Team to assist in the care of this patient.   Time In: 0930 Time Out: 1005 Total Time 35 min Prolonged Time Billed  no       Greater than 50%  of this time was spent counseling and coordinating care related to the above assessment and plan.  Wadie Lessen, NP  Please contact Palliative Medicine Team phone at [phone] for questions and concerns.

## 2016-11-06 NOTE — Progress Notes (Signed)
[facility] PROGRESS NOTE  Patient: Dustin [name] [id number]   PCP: Osborne Casco, MD DOB: 1926/11/30   DOA: 11/03/2016   DOS: 11/06/2016    Subjective: Patient refused to get examined.  Objective:  Vitals:   11/05/16 1800 11/06/16 0521  BP: (!) 161/77 (!) 153/72  Pulse: 76 81  Resp:  18  Temp: 98.7 F (37.1 C) 97.9 F (36.6 C)    Does not appear in any acute distress, no accessory muscle utilization, on room air.  Assessment and plan: Healthcare associated pneumonia. On oral Levaquin. We'll monitor.  Acute encephalopathy. Patient hasn't been agitated overnight as well as during the day. Received IV Haldol last night. Further adjustment of his Seroquel has been initiated. We will monitor and likely discharge tomorrow.  Author: Berle Mull, MD [facility] Pager: [phone] 11/06/2016 4:30 PM   If 7PM-7AM, please contact night-coverage at [email or web address], password [facility]

## 2016-11-06 NOTE — Progress Notes (Signed)
Pharmacy Antibiotic Note  Dustin [name] is a 83 y.o. male admitted on 11/03/2016 with pneumonia.  Pharmacy has been consulted for levofloxacin dosing. Previously on cefepime - day #4. Afebrile, WBC wnl. Renal function stable, CrCl~52.  Last dose of cefepime given 1/30 at 0504.  Plan: Levofloxacin 750mg  IV q24h Monitor clinical progress, c/s, renal function, LOT   Height: 5\' 10"  (177.8 cm) Weight: 181 lb (82.1 kg) IBW/kg (Calculated) : 73  Temp (24hrs), Avg:98.3 F (36.8 C), Min:97.9 F (36.6 C), Max:98.7 F (37.1 C)   Recent Labs Lab 11/03/16 1412 11/04/16 0438  WBC 6.1 6.4  CREATININE 0.97 1.02    Estimated Creatinine Clearance: 52.7 mL/min (by C-G formula based on SCr of 1.02 mg/dL).    Allergies  Allergen Reactions  . Tape Other (See Comments)    SKIN IS VERY THIN AND WILL BRUISE AND TEAR EASILY!!    Vanc 1/27 >> 1/28 Cefepime 1/27 >> 1/30 Tamiflu 1/27 >> 1/28 Levo 1/30 >>  1/28 strep pneumo: neg 1/28 Flu A / B - negative 1/28 MRSA PCR - negative   Elicia Lamp, PharmD, BCPS Clinical Pharmacist 11/06/2016 11:27 AM

## 2016-11-07 LAB — GLUCOSE, CAPILLARY: Glucose-Capillary: 167 mg/dL — ABNORMAL HIGH (ref 65–99)

## 2016-11-07 MED ORDER — SENNOSIDES-DOCUSATE SODIUM 8.6-50 MG PO TABS: 1.0000 | ORAL_TABLET | Freq: Every day | ORAL | 0 refills | Status: AC

## 2016-11-07 MED ORDER — AMLODIPINE BESYLATE 2.5 MG PO TABS: 2.5000 mg | ORAL_TABLET | Freq: Every day | ORAL | 0 refills | Status: AC

## 2016-11-07 MED ORDER — GUAIFENESIN ER 600 MG PO TB12: 600.0000 mg | ORAL_TABLET | Freq: Two times a day (BID) | ORAL | 0 refills | Status: AC

## 2016-11-07 MED ORDER — MIRTAZAPINE 15 MG PO TABS: 15.0000 mg | ORAL_TABLET | Freq: Every day | ORAL | 0 refills | Status: AC

## 2016-11-07 MED ORDER — QUETIAPINE FUMARATE 50 MG PO TABS: 50.0000 mg | ORAL_TABLET | Freq: Two times a day (BID) | ORAL | 0 refills | Status: AC

## 2016-11-07 NOTE — Progress Notes (Signed)
Physical Therapy Note   Chart reviewed;   Noted Goals of Care are focused on Comfort and Dignity, and plan is to DC home with Hospice services;   Plan for non-emergent ambulance transport home, likely today; family is well-equipped;   Will sign off acute PT at this time;  Roney Marion, [place] Pager [phone] Office [phone]

## 2016-11-07 NOTE — Progress Notes (Addendum)
[facility] Liaison:  Spoke with Manuela Schwartz, [facility], patient will be discharged home today.  Angela Nevin, patient's daughter, will be receiving equipment in the home from [facility] today between 1000 and 200pm.   After that is received, patient will be going home.  Referral center to follow up with Carla to set up AV visit this afternoon/evening.  Thank you,  Edyth Gunnels, RN, [facility] Liaison [phone]  All hospital liaison's are now on [place].  Please feel free to contact me at the above number or call hospice at [phone] after 5pm.

## 2016-11-07 NOTE — Progress Notes (Signed)
Pt ready for d/c home today with hospice. PTAR set up by CM, and daughter has received equipment. RN called Angela Nevin to discuss medications and follow up information. Pt's IV removed. Waiting for transportation via [place].  [place], Dustin [name]

## 2016-11-07 NOTE — Care Management Important Message (Signed)
Important Message  Patient Details  Name: Dustin [name] MRN: [id number] Date of Birth: 1927/07/25   Medicare Important Message Given:  Yes    [name] [address] Circle 11/07/2016, 12:38 PM

## 2016-11-07 NOTE — Care Management (Signed)
Case manager spoke with Angela Nevin, patient's daughter concerning discharge. She is ready to receive her dad. CM called PTAR and arranged for transport, pickup will be within 30 minutes approx. CM has spoke with [facility] Liaison. Ricki Miller, RN BSN Case Manager

## 2016-11-09 NOTE — Discharge Summary (Signed)
[facility] Discharge Summary   Patient: Dustin [name] [id number]   PCP: Osborne Casco, MD DOB: [birth year]   Date of admission: 11/03/2016   Date of discharge: 11/07/2016   Discharge Diagnoses:  Principal Problem:   HCAP (healthcare-associated pneumonia) Active Problems:   Dementia with behavioral disturbance   Essential hypertension   Diabetes mellitus type 2, noninsulin dependent ([place])   Chronic atrial fibrillation ([place])   History of CVA (cerebrovascular accident)   Normocytic anemia   Seizure disorder as sequela of cerebrovascular accident ([place])   Hypokalemia   Prostate cancer ([place])   Admitted From: Home Disposition:  Home with hospice  Recommendations for Outpatient Follow-up:  1. Establish care with hospice and follow the recommendation   Follow-up Information    [facility]ice at [facility] Follow up.   Specialty:  Hospice and Palliative Medicine Contact information: [place] [address] [phone]          Diet recommendation: Comfort feeds  Activity: The patient is advised to gradually reintroduce usual activities.  Discharge Condition: good  Code Status: DNR/DNI, home with hospice  History of present illness: As per the H and P dictated on admission, "Dustin [name] is a 83 y.o. male with medical history significant for paroxysmal atrial fibrillation recently taken off of anticoagulation, type 2 diabetes mellitus, hypertension, dementia with behavioral disturbance, prostate cancer, history of multiple CVAs, and seizure disorder, now presenting to the emergency department for evaluation of a worsening cough despite treatment with Tamiflu at his SNF. History is a little bit limited by the patient's dementia. Per the report of patient's family and nursing facility personnel, there has been a worsening cough and dyspnea over the past week. It is unclear whether he was tested for influenza, but had been started on Tamiflu.  Despite this, his cough and dyspnea has continued to worsen. Cough has been productive of thick sputum. No fevers have been reported, but patient has been increasingly lethargic and with poor appetite. The patient denies chest pain or palpitations and there has been no swelling or tenderness in the lower extremities. He was hospitalized 1 month ago for new onset seizures and discharged on Vimpat. He had previously been on Pradaxa for A. fib, but developed bright red blood per rectum during the recent hospitalization, and given the patient's desire for more palliative approach to his care, the anticoagulant was discontinued."  Hospital Course:   Summary of his active problems in the hospital is as following. 1. HCAP  - Pt presents with worsening productive cough, dyspnea, and lethargy, found to have rhonchi on exam and corresponding infiltrate on CXR  - He was started on empiric vancomycin and cefepime initially  - sputum culture and gram stain, respiratory virus panel, and strep pneumo urine antigen is negative - stop Tamiflu Since Flu PCR is negative. - Continue Mucinex, duo nebs and flutter device. - Patient not satisfactorily able to come performed a flutter device and also does not have an excellent cough. Remains at high risk for poor outcome.  2. Hypokalemia  As I'll Monitor.   3. Paroxysmal atrial fibrillation  - In a sinus rhythm on admission  - CHADS-VASc 7 (age x2, HTN, DM, hx CVA x2) - Taken off of anticoagulation earlier this month given BRBPR and pt's desire for a more palliative approach to his care , family currently wants to continue anticoagulation which is resumed. Further discussion with hospice needed to educate the family regarding this. - Not on rate-control agent; bradycardic at baseline  4. Seizure disorder  - First seizure one month ago, none since  - Continue lincosamide   5. Dementia with behavioral disturbance  - Increase Seroquel to twice a day due to  agitation, after in the increase in the dose the agitation appears to be well controlled.   6. Hypertension  - BP mildly elevated - Pt is no longer taking antihypertensives at his SNF - Starting on schedule Norvasc  7. Type II DM  - A1c was 6.6% in June 2017 - Managed with metformin only at the SNF; this is held on admission  Monitor.  8. Normocytic anemia  - Hgb is 12.5 on admission  - This is stable relative to recent priors and with no evidence of bleeding   9. Prostate cancer - Appears to be stable - Stop Casodex  Bowel regimen: last BM prior to admission, bowel regimen initiated Diet: cardiac diet DVT Prophylaxis: subcutaneous Heparin  Advance goals of care discussion: DNR DNI, was on comfort care at the time of last discharge.  [place] ON PHONE. Pt was on palliative care support as per their understanding and not on hospice at the time of discharge from SNF. Pt was at home more lucid and has been more combative here in hospital per them. After discussing with the palliative care, family has agreed that at the time of the discharge goal of care would be hospice, referral sent out, comfort and dignity are the main focus of care in the hospital. Prefer to avoid hospitalization.  All other chronic medical condition were stable during the hospitalization.  Patient was palliative care and home with hospice was arranged by social worker and case Freight forwarder. On the day of the discharge the patient's vitals were stable, and no other acute medical condition were reported by patient. the patient was felt safe to be discharge at home with hospice.  Procedures and Results:  none   Consultations:  Palliative care  DISCHARGE MEDICATION: Discharge Medication List as of 11/07/2016 12:06 PM    START taking these medications   Details  amLODipine (NORVASC) 2.5 MG tablet Take 1 tablet (2.5 mg total) by mouth daily., Starting Wed 11/07/2016, Normal    guaiFENesin  (MUCINEX) 600 MG 12 hr tablet Take 1 tablet (600 mg total) by mouth 2 (two) times daily., Starting Wed 11/07/2016, Normal    mirtazapine (REMERON) 15 MG tablet Take 1 tablet (15 mg total) by mouth at bedtime., Starting Wed 11/07/2016, Normal    senna-docusate (SENOKOT-S) 8.6-50 MG tablet Take 1 tablet by mouth at bedtime., Starting Wed 11/07/2016, Normal      CONTINUE these medications which have CHANGED   Details  QUEtiapine (SEROQUEL) 50 MG tablet Take 1 tablet (50 mg total) by mouth 2 (two) times daily., Starting Wed 11/07/2016, Normal      CONTINUE these medications which have NOT CHANGED   Details  dabigatran (PRADAXA) 150 MG CAPS capsule Take 150 mg by mouth 2 (two) times daily., Historical Med    divalproex (DEPAKOTE) 125 MG DR tablet Take 125 mg by mouth 2 (two) times daily., Historical Med    lacosamide 100 MG TABS Take 1 tablet (100 mg total) by mouth 2 (two) times daily., Starting Fri 10/05/2016, Normal    lovastatin (MEVACOR) 40 MG tablet Take 40 mg by mouth daily. , Historical Med    metFORMIN (GLUCOPHAGE) 500 MG tablet Take 500 mg by mouth daily., Historical Med    Morphine Sulfate (MORPHINE CONCENTRATE) 10 MG/0.5ML SOLN concentrated solution Take 0.25  mLs (5 mg total) by mouth every 6 (six) hours., Starting Wed 10/10/2016, Print    polyethylene glycol (MIRALAX / GLYCOLAX) packet Take 17 g by mouth daily as needed for mild constipation., Starting Wed 10/10/2016, Normal      STOP taking these medications     bicalutamide (CASODEX) 50 MG tablet      brimonidine (ALPHAGAN P) 0.1 % SOLN      omeprazole (PRILOSEC) 20 MG capsule      oseltamivir (TAMIFLU) 75 MG capsule      sertraline (ZOLOFT) 25 MG tablet      valsartan-hydrochlorothiazide (DIOVAN-HCT) 80-12.5 MG tablet        Allergies  Allergen Reactions  . Tape Other (See Comments)    SKIN IS VERY THIN AND WILL BRUISE AND TEAR EASILY!!   Discharge Instructions    Diet general    Complete by:  As directed     Increase activity slowly    Complete by:  As directed      Discharge Exam: Filed Weights   11/03/16 1407 11/03/16 2110  Weight: 82.1 kg (181 lb) 82.1 kg (181 lb)   Vitals:   11/06/16 2042 11/07/16 0900  BP: (!) 182/67 (!) 108/48  Pulse: 72 82  Resp: 18 16  Temp: 98.2 F (36.8 C) 98.2 F (36.8 C)   General: Appear in no distress, no Rash; Oral Mucosa moist. Cardiovascular: S1 and S2 Present, no Murmur, no JVD Respiratory: Bilateral Air entry present and basal Crackles, Occasional wheezes Abdomen: Bowel Sound present, Soft and no tenderness Extremities: no Pedal edema, no calf tenderness Neurology: Grossly no focal neuro deficit.  The results of significant diagnostics from this hospitalization (including imaging, microbiology, ancillary and laboratory) are listed below for reference.    Significant Diagnostic Studies: Dg Chest 2 View  Result Date: 11/03/2016 CLINICAL DATA:  Cough EXAM: CHEST  2 VIEW COMPARISON:  03/08/2016 FINDINGS: Heart and mediastinal contours are within normal limits. Patchy airspace disease noted posteriorly at the lung bases on the lateral view, likely in the left base. No effusions. No acute bony abnormality. IMPRESSION: Patchy opacity posteriorly on the lateral view, likely in the left lower lobe, concerning for pneumonia. Electronically Signed   By: Rolm Baptise M.D.   On: 11/03/2016 14:38    Microbiology: Recent Results (from the past 240 hour(s))  MRSA PCR Screening     Status: None   Collection Time: 11/04/16 12:50 AM  Result Value Ref Range Status   MRSA by PCR NEGATIVE NEGATIVE Final    Comment:        The GeneXpert MRSA Assay (FDA approved for NASAL specimens only), is one component of a comprehensive MRSA colonization surveillance program. It is not intended to diagnose MRSA infection nor to guide or monitor treatment for MRSA infections.      Labs: CBC:  Recent Labs Lab 11/03/16 1412 11/04/16 0438  WBC 6.1 6.4  NEUTROABS 3.3  3.5  HGB 12.5* 12.1*  HCT 37.8* 36.9*  MCV 97.4 97.4  PLT 213 [id number]   Basic Metabolic Panel:  Recent Labs Lab 11/03/16 1412 11/04/16 0438  NA 141 142  K 3.1* 3.1*  CL 104 105  CO2 27 28  GLUCOSE 103* 114*  BUN 9 11  CREATININE 0.97 1.02  CALCIUM 9.2 8.8*  MG  --  1.7   CBG:  Recent Labs Lab 11/05/16 1118 11/05/16 1652 11/05/16 2201 11/06/16 0739 11/07/16 1203  GLUCAP 115* 158* 174* 129* 167*   Time spent: 30 minutes  SignedBerle Mull  [facility] 11/07/2016 , 1:16 PM

## 2016-11-22 IMAGING — MR MR ABDOMEN WO/W CM
10 of 19 series · 20 of 48 positions shown · IV contrast (multihance)
Comparison: CT 12/26/2015, 02/14/2015

CLINICAL DATA: Prostate cancer.  Evaluate frank.  Lesion.

EXAM:
MRI ABDOMEN WITHOUT AND WITH CONTRAST
TECHNIQUE: Multiplanar multisequence MR imaging of the abdomen was performed
both before and after the administration of intravenous contrast.
CONTRAST:  17mL MULTIHANCE GADOBENATE DIMEGLUMINE 529 MG/ML IV SOLN

[Series 3: T2 fat-sat · axial · 5.0mm · 0.78mm/px · z∈[-122,+113]mm · 2 of 48 slices shown]
[im 1/48]
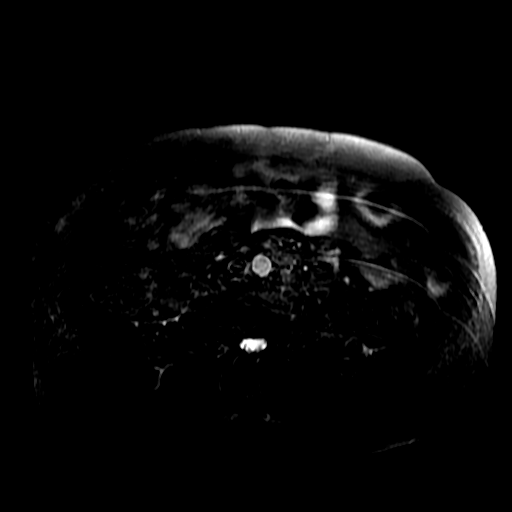
[im 48/48]
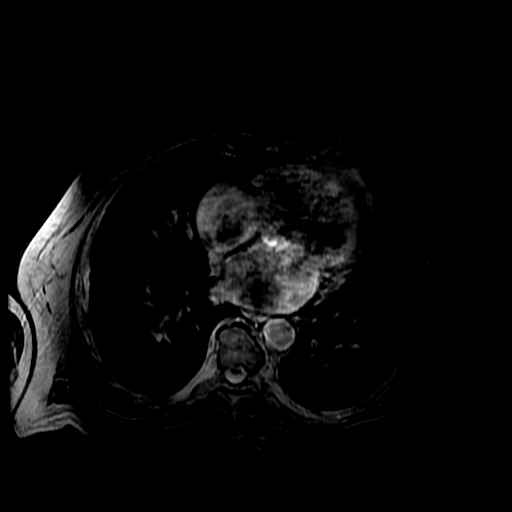

[Series 4: DWI b500 · axial · 6.0mm · 1.48mm/px · z∈[-92,+135]mm · 2 of 60 slices shown]
[im 1/60]
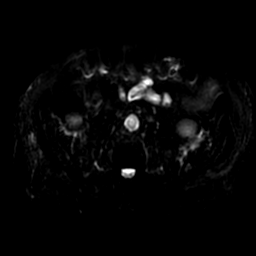
[im 60/60]
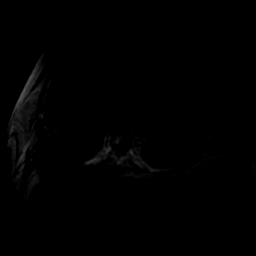

[Series 8: ax dualecho · axial · 5.0mm · 0.78mm/px · z∈[-126,+99]mm · 3 of 92 slices shown]
[im 1/92]
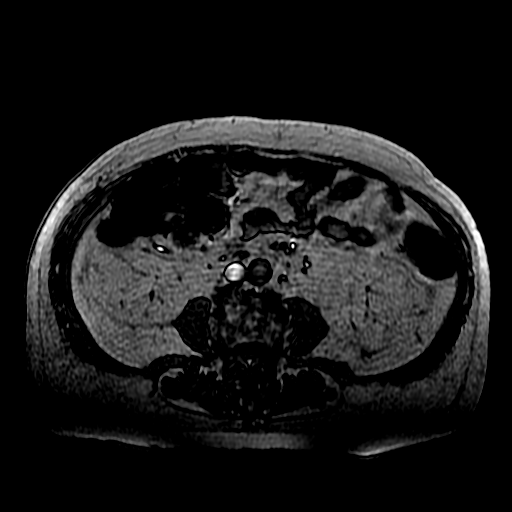
[im 46/92]
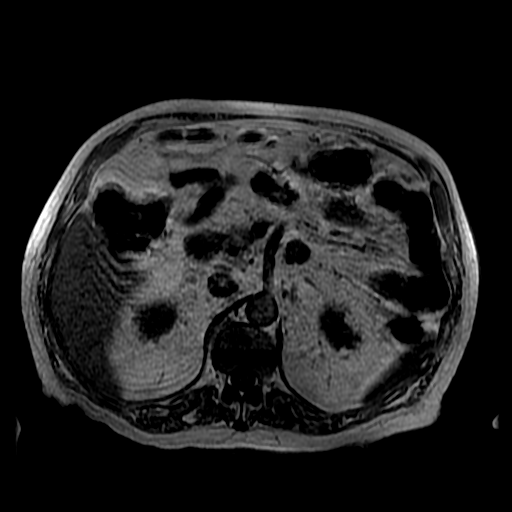
[im 92/92]
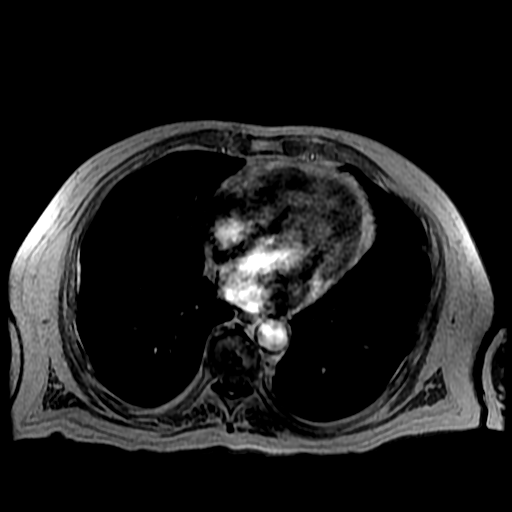

[Series 9: T2 · axial · 5.0mm · 0.78mm/px · z∈[-126,+99]mm · 2 of 46 slices shown (1 of 2)]
[im 1/46]
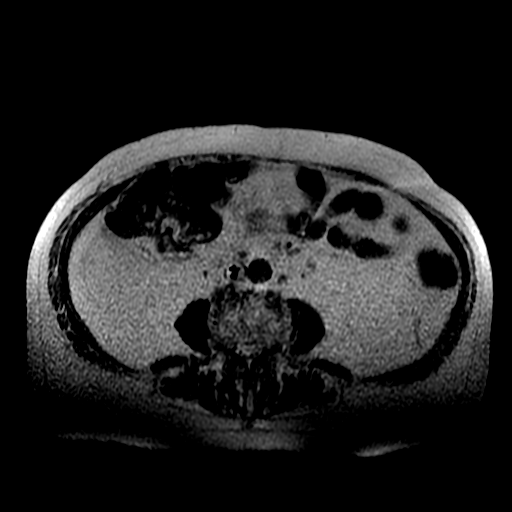
[im 46/46]
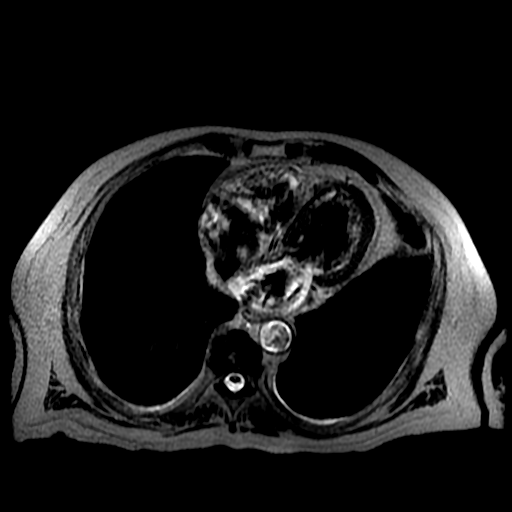

[Series 10: bSSFP · coronal · 5.0mm · 0.78mm/px · 1 of 44 slices shown]
[im 1/44]
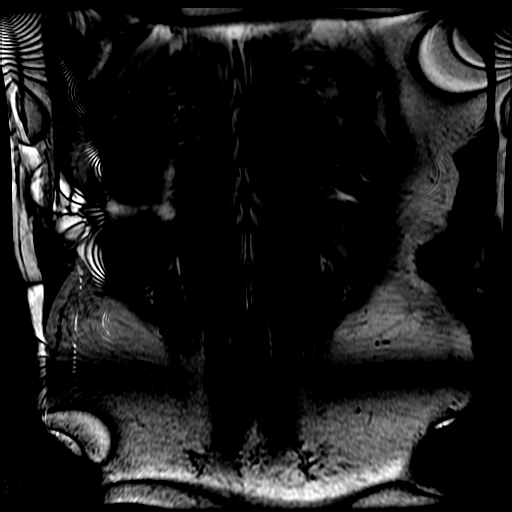

[Series 11: T2 · coronal · 5.0mm · 0.78mm/px · 1 of 44 slices shown (2 of 2)]
[im 1/44]
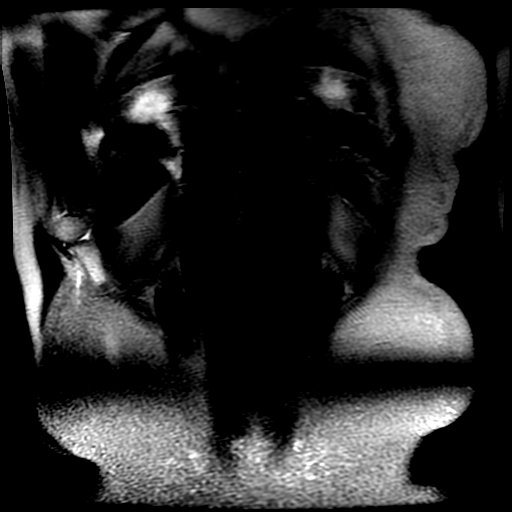

[Series 400: DWI · axial · 6.0mm · 1.48mm/px · 1 of 30 slices shown]
[im 1/30]
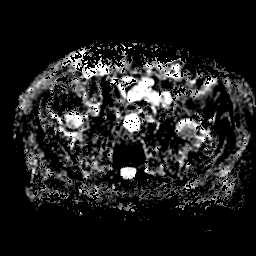

[Series 1200: T1 dynamic · axial · 5.0mm · 0.78mm/px · z∈[-127,+90]mm · 3 of 88 slices shown (1 of 3)]
[im 1/88]
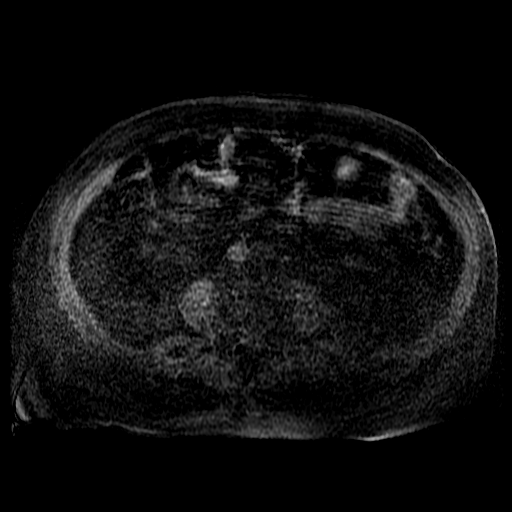
[im 44/88]
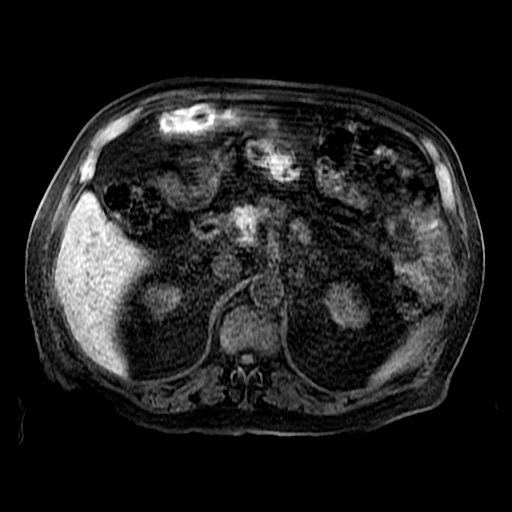
[im 88/88]
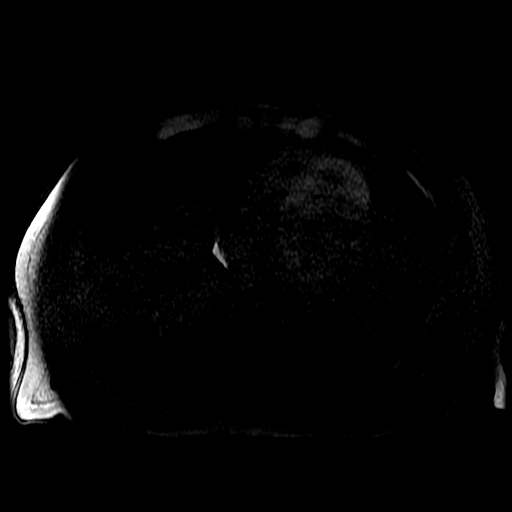

[Series 1201: T1 dynamic · axial · 5.0mm · 0.78mm/px · z∈[-127,+90]mm · 3 of 88 slices shown (2 of 3)]
[im 1/88]
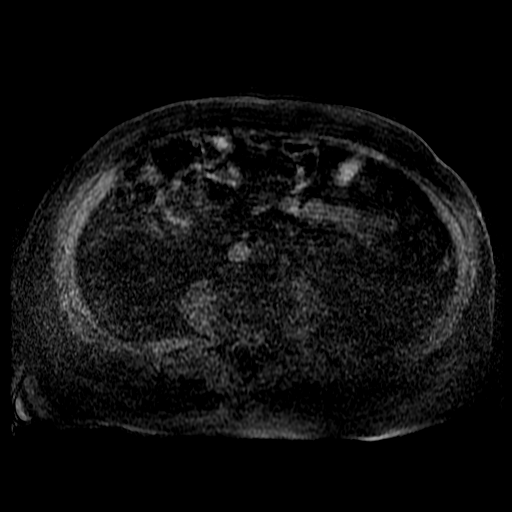
[im 44/88]
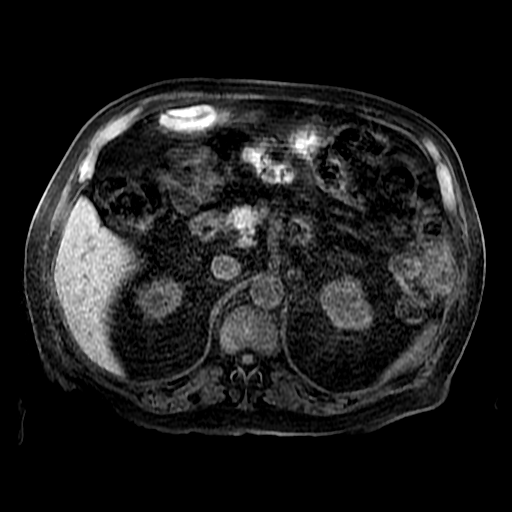
[im 88/88]
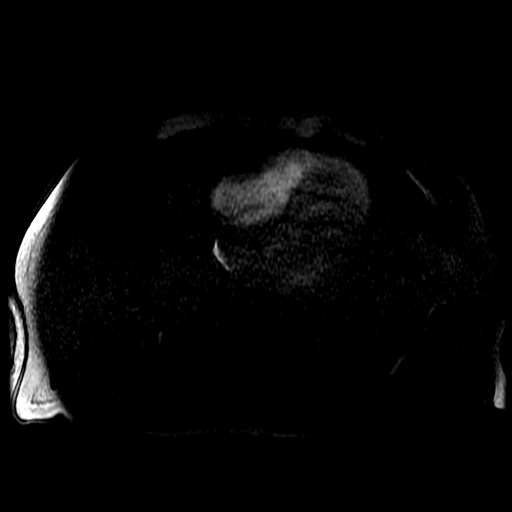

[Series 1202: T1 dynamic · axial · 5.0mm · 0.78mm/px · z∈[-127,-20]mm · 2 of 88 slices shown (3 of 3)]
[im 1/88]
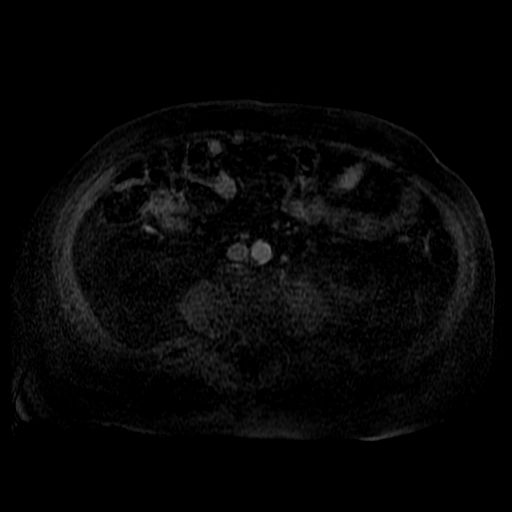
[im 44/88]
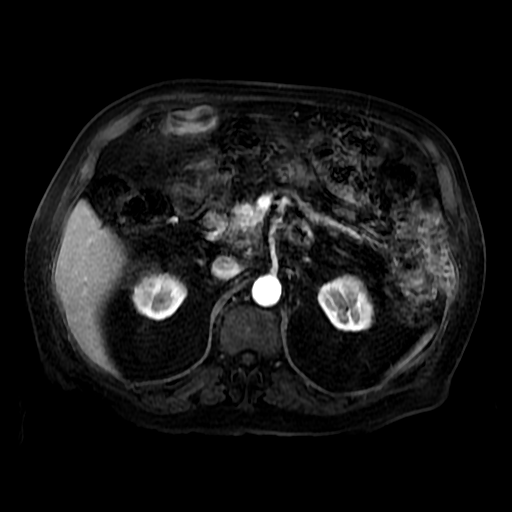

[20 of 48 positions shown; findings below may reference images not displayed]

FINDINGS: Lower chest:  Lung bases are clear.

Hepatobiliary: No focal hepatic lesion. No intrahepatic biliary duct
dilatation. The common hepatic duct is mildly dilated 8 mm. The
common bile duct is within normal limits through the pancreatic head
at 5 mm (image 51 through 52 of the series 5). Post cholecystectomy.

Pancreas: There is cystic dilatation of the pancreatic duct at genu
of the pancreas measuring 16 mm by 12 mm (image 60, series 5). The
distal pancreatic duct leading up to the ampulla is normal caliber
at 2 mm (image 56, series 5). There the duct is lost through the
pancreatic head at the level of this caliber change (image 56,
series 5).

There is a lesion within the inferior ventral aspect of the
pancreatic head measuring 19 mm by 20 mm (47, series 0555) which has
higher signal intensity than the adjacent pancreatic parenchyma.
This is lesion of concern which appeared to with increase in size
over comparison CT exams. Lesion does demonstrate post-contrast
enhancement (series 2727). There is atrophy of the pancreatic tail
with minimal duct dilatation proximal to the focal dilatation.

No peripancreatic lymph nodes identified.

Spleen: Normal spleen.

Adrenals/urinary tract: Adrenal glands and kidneys are normal.

Stomach/Bowel: Stomach and limited of the small bowel is
unremarkable

Vascular/Lymphatic: Abdominal aortic normal caliber. No
retroperitoneal periportal lymphadenopathy.

Musculoskeletal: No aggressive osseous lesion
IMPRESSION: 1. Focal duct dilatation of the pancreatic duct at the genu of the
pancreas is a concerning finding. Differential would include
obstructing mass lesion versus intraductal papillary mucinous tumor.
2. A 2 cm enhancing lesion along the ventral aspect of the
pancreatic head. Lesion is increased slightly in size over
comparison CTs and is concerning for neoplasm. Differential would
include pancreatic neuroendocrine tumor versus pancreatic
adenocarcinoma.
3. No evidence of biliary obstruction.
4. Recommend endoscopic ultrasound for further evaluation and tissue
sampling as well correlation with pancreatic tumor serum bio
markers.

## 2016-12-06 DEATH — deceased
